# Patient Record
Sex: Male | Born: 1965 | Race: White | Hispanic: No | Marital: Single | Smoking: Former smoker
Health system: Southern US, Community
[De-identification: ages and names within clinical notes are randomized; demographics above are authoritative.]

## PROBLEM LIST (undated history)

## (undated) ENCOUNTER — Emergency Department (HOSPITAL_COMMUNITY): Admission: EM | Payer: Medicare Other

## (undated) ENCOUNTER — Emergency Department (HOSPITAL_COMMUNITY): Admission: EM | Payer: Medicare Other | Source: Home / Self Care

## (undated) DIAGNOSIS — N289 Disorder of kidney and ureter, unspecified: Secondary | ICD-10-CM

## (undated) DIAGNOSIS — Z95810 Presence of automatic (implantable) cardiac defibrillator: Secondary | ICD-10-CM

## (undated) DIAGNOSIS — Z720 Tobacco use: Secondary | ICD-10-CM

## (undated) DIAGNOSIS — I10 Essential (primary) hypertension: Secondary | ICD-10-CM

## (undated) DIAGNOSIS — F1910 Other psychoactive substance abuse, uncomplicated: Secondary | ICD-10-CM

## (undated) DIAGNOSIS — I472 Ventricular tachycardia: Secondary | ICD-10-CM

## (undated) DIAGNOSIS — I480 Paroxysmal atrial fibrillation: Secondary | ICD-10-CM

## (undated) HISTORY — DX: Ventricular tachycardia: I47.2

## (undated) HISTORY — PX: CARDIAC DEFIBRILLATOR PLACEMENT: SHX171

## (undated) HISTORY — DX: Paroxysmal atrial fibrillation: I48.0

## (undated) HISTORY — DX: Disorder of kidney and ureter, unspecified: N28.9

## (undated) HISTORY — PX: HERNIA REPAIR: SHX51

## (undated) HISTORY — PX: CERVICAL SPINE SURGERY: SHX589

## (undated) HISTORY — DX: Essential (primary) hypertension: I10

## (undated) HISTORY — DX: Other psychoactive substance abuse, uncomplicated: F19.10

## (undated) HISTORY — DX: Tobacco use: Z72.0

## (undated) HISTORY — PX: LUMBAR SPINE SURGERY: SHX701

## (undated) HISTORY — PX: CORONARY ANGIOPLASTY: SHX604

## (undated) NOTE — *Deleted (*Deleted)
***In Progress*** PCP: Betti Cruz, PA-C EP: Dr. [name] Cardiology: Dr. Shirlee Latch  HPI:  89 y.o. with history of nonischemic dilated cardiomyopathy and paroxysmal atrial fibrillation presents for followup of CHF.  Patient lives in [place], [place].  He gets sporadic cardiology care as it is hard for him to get a ride to [place] and he does not want to see any of the cardiologists in [place]. He is followed by Dr. [name] for his [facility] ICD.  I saw him initially in 3/19, when he was referred by Dr. [name] for left/right heart cath.  This showed no coronary disease and low but not markedly low cardiac output (CI 2.22).  Echo in 3/19 showed a severely dilated LV with EF 20%.    Echo in 6/21 showed EF <20%, severe LV dilation, normal RV size with mildly decreased systolic function, IVC normal.  RHC was done in 6/21, showing low filling pressures and CI 2.38 by Fick/2.68 by Thermo.  CPX in 6/21 showed both severe deconditioning and severe HF limitation.  In 7/21, he had barostimulation activator therapy placed.   Recently returned for CHF followup on 09/11/20. He walked with a cane chronically due to arthritis (neck and back pain, has had back surgery) and poor balance with prior CVA.  He also quit smoking. Since barostimulator was placed, he reported feeling better. Weight was stable. He was riding his stationary bike for exercise and walking outside with his walker. He reported not getting short of breath walking with his walker but getting short of breath making up his bed and doing other forms of moderate exertion. No chest pain, orthopnea, or PND.    Today he returns to HF clinic for pharmacist medication titration. At last visit with MD losartan was increased to 25 mg QHS and amiodarone was decreased to 100 mg daily.   Need BMET   Overall feeling ***. Dizziness, lightheadedness, fatigue:  Chest pain or palpitations:  How is your breathing?: *** SOB: Able to complete all ADLs. Activity  level ***  Weight at home pounds.*** Takes furosemide/torsemide/bumex *** mg *** daily.  LEE *** PND/Orthopnea ***  Appetite *** Low-salt diet:   Physical Exam Cost/affordability of meds   . Shortness of breath/dyspnea on exertion? {YES [id number]  . Orthopnea/PND? {YES [id number] . Edema? {YES [id number] . Lightheadedness/dizziness? {YES [id number] . Daily weights at home? {YES [id number] . Blood pressure/heart rate monitoring at home? {YES [id number] . Following low-sodium/fluid-restricted diet? {YES NO:22349}  HF Medications: Losartan 25 mg QHS Spironolactone 25 mg daily Farxiga (dapagliflozin) 10 mg daily Digoxin 0.125 mg daily Furosemide 40 mg daily PRN  Has the patient been experiencing any side effects to the medications prescribed?  {YES NO:22349}  Does the patient have any problems obtaining medications due to transportation or finances?   {YES NO:22349}  Understanding of regimen: {excellent/good/fair/poor:19665} Understanding of indications: {excellent/good/fair/poor:19665} Potential of compliance: {excellent/good/fair/poor:19665} Patient understands to avoid NSAIDs. Patient understands to avoid decongestants.    Pertinent Lab Values: . Serum creatinine ***, BUN ***, Potassium ***, Sodium ***, BNP ***, Magnesium ***, Digoxin ***   Vital Signs: . Weight: *** (last clinic weight: ***) . Blood pressure: ***  . Heart rate: ***   Assessment: 1. Chronic systolic CHF: Echo 6/21 with EF <20%, severe LV dilation, normal RV size with mildly decreased systolic function, IVC normal.  LHC/RHC in 3/20 with no coronary disease, relatively preserved cardiac output.  Nonischemic cardiomyopathy with [employer] ICD.  Probably not CRT candidate, has IVCD but not classic LBBB.  RHC in 6/21 showed low filling pressures and preserved cardiac output, but CPX (submaximal) in 6/21 showed severe limitation due to HF as well as severe limitation due to deconditioning. - NYHA class III symptoms,  better since barostimulator implanted. Euvolemic on exam - Continue furosemide 40 mg daily PRN - Continue*** losartan ***25 mg QHS. Check BMET today. - Continue spironolactone 25 mg daily - Continue Farxiga (dapagliflozin) 10 mg daily - Continue digoxin 0.125 daily - Markedly deconditioned and cachectic, needs to be in better physical shape prior to any consideration of advanced therapies.  Luckily, he is feeling better with barostimulation and cardiac output was not low on recent RHC.  He has been drinking Ensure. Previously encouraged to join cardiac rehab but he does not have transportation.   2. Smoking/?COPD: Patient has quit smoking.  Restrictive PFTs with 6/21 CPX.   3. H/o VT:  - Continue amiodarone 100 mg daily.  - Previously instructed to get a regular eye exam.   4. Atrial fibrillation: Paroxysmal. NSR today.  - Continue warfarin.   Plan: 1) Medication changes: Based on clinical presentation, vital signs and recent labs will *** 2) Labs: *** 3) Follow-up: 8 week appointment with Dr. Shirlee Latch on 12/05/20   Karle Plumber, PharmD, BCPS, BCCP, CPP Heart Failure Clinic Pharmacist [phone]

---

## 1999-05-08 DIAGNOSIS — I509 Heart failure, unspecified: Secondary | ICD-10-CM

## 1999-05-08 HISTORY — DX: Heart failure, unspecified: I50.9

## 2008-08-07 HISTORY — PX: THORACOTOMY: SUR1349

## 2009-09-22 ENCOUNTER — Ambulatory Visit: Payer: Self-pay | Admitting: Internal Medicine

## 2009-09-22 ENCOUNTER — Inpatient Hospital Stay (HOSPITAL_COMMUNITY): Admission: EM | Admit: 2009-09-22 | Discharge: 2009-10-02 | Payer: Self-pay | Admitting: Cardiology

## 2009-09-22 ENCOUNTER — Encounter: Payer: Self-pay | Admitting: Cardiology

## 2009-09-22 ENCOUNTER — Ambulatory Visit: Payer: Self-pay | Admitting: Cardiology

## 2009-09-23 ENCOUNTER — Encounter: Payer: Self-pay | Admitting: Cardiology

## 2009-09-24 ENCOUNTER — Encounter: Payer: Self-pay | Admitting: Cardiology

## 2009-09-24 ENCOUNTER — Encounter: Payer: Self-pay | Admitting: Internal Medicine

## 2009-09-25 ENCOUNTER — Encounter: Payer: Self-pay | Admitting: Cardiology

## 2009-09-26 ENCOUNTER — Encounter: Payer: Self-pay | Admitting: Cardiology

## 2009-10-02 ENCOUNTER — Encounter: Payer: Self-pay | Admitting: Cardiology

## 2009-10-08 ENCOUNTER — Ambulatory Visit: Payer: Self-pay | Admitting: Cardiology

## 2009-10-08 DIAGNOSIS — I10 Essential (primary) hypertension: Secondary | ICD-10-CM | POA: Insufficient documentation

## 2009-10-08 DIAGNOSIS — F1910 Other psychoactive substance abuse, uncomplicated: Secondary | ICD-10-CM | POA: Insufficient documentation

## 2009-10-08 DIAGNOSIS — I5022 Chronic systolic (congestive) heart failure: Secondary | ICD-10-CM | POA: Insufficient documentation

## 2009-10-08 DIAGNOSIS — F17200 Nicotine dependence, unspecified, uncomplicated: Secondary | ICD-10-CM | POA: Insufficient documentation

## 2009-10-22 ENCOUNTER — Ambulatory Visit: Payer: Self-pay | Admitting: Cardiology

## 2009-10-22 DIAGNOSIS — G4700 Insomnia, unspecified: Secondary | ICD-10-CM | POA: Insufficient documentation

## 2009-10-22 DIAGNOSIS — F411 Generalized anxiety disorder: Secondary | ICD-10-CM | POA: Insufficient documentation

## 2009-10-22 DIAGNOSIS — I428 Other cardiomyopathies: Secondary | ICD-10-CM | POA: Insufficient documentation

## 2009-10-24 ENCOUNTER — Encounter: Payer: Self-pay | Admitting: Cardiology

## 2009-11-05 ENCOUNTER — Encounter: Payer: Self-pay | Admitting: Cardiology

## 2009-11-06 ENCOUNTER — Encounter: Payer: Self-pay | Admitting: Cardiology

## 2009-12-11 ENCOUNTER — Encounter: Payer: Self-pay | Admitting: Internal Medicine

## 2009-12-24 ENCOUNTER — Ambulatory Visit: Payer: Self-pay | Admitting: Cardiology

## 2010-05-09 ENCOUNTER — Encounter: Payer: Self-pay | Admitting: Cardiology

## 2010-05-14 ENCOUNTER — Ambulatory Visit: Payer: Self-pay | Admitting: Cardiology

## 2010-05-14 ENCOUNTER — Encounter: Payer: Self-pay | Admitting: Cardiology

## 2010-06-27 ENCOUNTER — Ambulatory Visit: Payer: Self-pay | Admitting: Internal Medicine

## 2010-07-08 ENCOUNTER — Encounter: Payer: Self-pay | Admitting: Internal Medicine

## 2010-07-28 ENCOUNTER — Telehealth (INDEPENDENT_AMBULATORY_CARE_PROVIDER_SITE_OTHER): Payer: Self-pay | Admitting: *Deleted

## 2010-07-29 ENCOUNTER — Encounter: Payer: Self-pay | Admitting: Internal Medicine

## 2010-07-29 ENCOUNTER — Telehealth: Payer: Self-pay | Admitting: Internal Medicine

## 2010-08-08 ENCOUNTER — Encounter: Payer: Self-pay | Admitting: Internal Medicine

## 2010-08-15 ENCOUNTER — Observation Stay (HOSPITAL_COMMUNITY): Admission: RE | Admit: 2010-08-15 | Discharge: 2010-08-16 | Payer: Self-pay | Admitting: Internal Medicine

## 2010-08-15 ENCOUNTER — Ambulatory Visit: Payer: Self-pay | Admitting: Internal Medicine

## 2010-08-21 ENCOUNTER — Encounter: Payer: Self-pay | Admitting: Internal Medicine

## 2010-08-28 ENCOUNTER — Ambulatory Visit: Payer: Self-pay

## 2010-08-28 ENCOUNTER — Encounter: Payer: Self-pay | Admitting: Internal Medicine

## 2010-11-26 ENCOUNTER — Ambulatory Visit: Payer: Self-pay | Admitting: Internal Medicine

## 2011-01-08 NOTE — Letter (Signed)
Summary: Notice of Privacy Practices   Notice of Privacy Practices   Imported By: Roderic Ovens 06/30/2010 10:07:56  _____________________________________________________________________  External Attachment:    Type:   Image     Comment:   External Document

## 2011-01-08 NOTE — Letter (Signed)
Summary: Appointment -missed  [facility] at [facility] [address]   [place], [place] [address]   Phone: [phone]  Fax: [phone]     December 11, 2009 MRN: [id number]      [facility] [address] [place], [place]  [address]      Dear Mr. [facility],  Our records indicate you missed your appointment on December 11, 2009                        with Dr. [name].   It is very important that we reach you to reschedule this appointment. We look forward to participating in your health care needs.   Please contact us at the number listed above at your earliest convenience to reschedule this appointment.   Sincerely,    Glass blower/designer

## 2011-01-08 NOTE — Assessment & Plan Note (Signed)
Summary: PMH   Past Cardiac History:  [facility] Hospitalizations: 1. Right heart cardiac catheterization. 2. A 2-D echocardiogram.   PRIMARY FINAL DISCHARGE DIAGNOSIS:  Acute systolic congestive heart failure secondary to nonischemic cardiomyopathy.   SECONDARY DIAGNOSES: 1. History of polysubstance abuse. 2. History of left thoracotomy in September 2009 for rib fracture and     associated pneumothorax. 3. Hypertension. 4. Allergy or intolerance to PENICILLIN. 5. History of tobacco use. 6. Family history of congestive heart failure in his brother. 7. Gout. 09/25/2009 - 11/02/2009:  CHF (CHF)     Cardiac Cath  Procedure date:  09/25/2009  Findings:      FINDINGS:  Mean right atrial pressure 10 mmHg, RV 49/8, PA 45/29, mean pulmonary capillary wedge pressure 25 mmHg.  Thermodilution cardiac output 2.21 L/minute, cardiac index 1.23.  Fick cardiac output 2.77 L/minute, cardiac index 1.55.  Mixed venous O2 saturation was 54%.   IMPRESSION:  Severe nonischemic cardiomyopathy with significantly decreased cardiac index and mild-to-moderate filling pressure elevation. We will plan on transferring the patient to the step-down unit.  We will place a PICC line and start him on milrinone 0.25 mcg/kg per minute. With the check, oximetry after milrinone was begun.  I will also start him on Lasix 40 mg IV q.8 h and continue his other medications.    Echocardiogram  Procedure date:  09/26/2009  Findings:        1. Left ventricle: The cavity size was moderately dilated. Wall      thickness was normal. The estimated ejection fraction was in the      range of 15% to 20%. Diffuse hypokinesis.   2. Mitral valve: Moderate regurgitation.   3. Right ventricle: The cavity size was moderately dilated. Systolic      function was moderately to severely reduced.   4. Right atrium: The atrium was mildly dilated.   Transthoracic echocardiography. M-mode, complete 2D, spectral   Doppler, and color  Doppler. Patient status: Inpatient. Location:   ICU/CCU   VQ Scan  Procedure date:  09/25/2009  Findings:        VENTILATION PERFUSION SCINTIGRAPHY    Findings: Ventilation imaging with 10. Xe-133 inhaled shows   homogeneous distribution throughout both lungs with mild retention   in both lung bases on the washout images. Perfusion imaging with   6.55mCi Tc33m MAA IV shows a somewhat heterogeneous distribution   throughout the lungs with no discrete segmental or subsegmental   perfusion defects.    IMPRESSION:   1.  Low likelihood ratio for pulmonary embolism.

## 2011-01-08 NOTE — Miscellaneous (Signed)
Summary: Home Care Report/ [facility]   Home Care Report/ [facility]   Imported By: Dorise Hiss 11/25/2009 16:44:47  _____________________________________________________________________  External Attachment:    Type:   Image     Comment:   External Document

## 2011-01-08 NOTE — Assessment & Plan Note (Signed)
Summary: 2 MO F/U PER 10/22/09 OV-JM   Visit Type:  Follow-up Primary Provider:  Vickey Sages, MD  CC:  follow-up visit.  History of Present Illness: the patient is a 45 year old male with a history of severe nonischemic cardiomyopathy with an ejection fraction of 15-20%. The patient has biventricular failure with severe RV dysfunction. He previously required an admission for intravenous milrinone. Initially it was an NYHA class III but on her current medical therapy has improved on NYHA class II. The patient has been compliant with his medical regimen. He actually denies any chest pain or shortness of breath on exertion. He denies any orthopnea or PND. He reports a significant improvement in his symptomatology. He denies any further drug use. Unfortunate patient had a fall when he slipped on the ice. He denies however any loss of consciousness.  Clinical Review Panels:  Echocardiogram Echocardiogram   1. Left ventricle: The cavity size was moderately dilated. Wall      thickness was normal. The estimated ejection fraction was in the      range of 15% to 20%. Diffuse hypokinesis.   2. Mitral valve: Moderate regurgitation.   3. Right ventricle: The cavity size was moderately dilated. Systolic      function was moderately to severely reduced.   4. Right atrium: The atrium was mildly dilated.   Transthoracic echocardiography. M-mode, complete 2D, spectral   Doppler, and color Doppler. Patient status: Inpatient. Location:   ICU/CCU (09/26/2009)  Cardiac Imaging Cardiac Cath Findings FINDINGS:  Mean right atrial pressure 10 mmHg, RV 49/8, PA 45/29, mean pulmonary capillary wedge pressure 25 mmHg.  Thermodilution cardiac output 2.21 L/minute, cardiac index 1.23.  Fick cardiac output 2.77 L/minute, cardiac index 1.55.  Mixed venous O2 saturation was 54%.   IMPRESSION:  Severe nonischemic cardiomyopathy with significantly decreased cardiac index and mild-to-moderate filling pressure  elevation. We will plan on transferring the patient to the step-down unit.  We will place a PICC line and start him on milrinone 0.25 mcg/kg per minute. With the check, oximetry after milrinone was begun.  I will also start him on Lasix 40 mg IV q.8 h and continue his other medications.   (09/25/2009)    Preventive Screening-Counseling & Management  Alcohol-Tobacco     Smoking Status: quit     Year Quit: 09/2009  Current Medications (verified): 1)  Coreg 6.25 Mg Tabs (Carvedilol) .... Take 1 Tablet By Mouth Two Times A Day 2)  Furosemide 40 Mg Tabs (Furosemide) .... Take 1 Tablet By Mouth Once A Day 3)  Lisinopril 10 Mg Tabs (Lisinopril) .... Take 1 Tablet By Mouth Two Times A Day 4)  Spironolactone 25 Mg Tabs (Spironolactone) .... Take 1 Tablet By Mouth Once A Day 5)  Aspirin 81 Mg Tbec (Aspirin) .... Take 1 Tablet By Mouth Once A Day 6)  Digoxin 0.25 Mg Tabs (Digoxin) .... Take 1 Tablet By Mouth Once A Day 7)  Multivitamins  Tabs (Multiple Vitamin) .... Take 1 Tablet By Mouth Once A Day 8)  Lorazepam 0.5 Mg Tabs (Lorazepam) .... Take 1 Tablet By Mouth Once A Day As Needed Anxiety 9)  Citalopram Hydrobromide 20 Mg Tabs (Citalopram Hydrobromide) .... Take 1 Tablet By Mouth Once A Day  Allergies (verified): 1)  Pcn  Comments:  Nurse/Medical Assistant: The patient's medications and allergies were reviewed with the patient and were updated in the Medication and Allergy Lists. List reviewed.  Past History:  Past Medical History: Last updated: 10/08/2009 Hypertension Tobacco use Gout Systolic  heart failure secondary to nonischemic cardiomyopathy. Polysubstance abuse Renal insufficiency  Past Surgical History: Last updated: 10/08/2009  History of left thoracotomy in September 2009 for rib fracture and     associated pneumothorax.  Family History: Last updated: 10/08/2009 Positive for dilated cardiomyopathy in brother  Social History: Last updated:  10/08/2009 Disability Drinks 6-8  beers on the weekend Uses marijuana occasionally  Risk Factors: Smoking Status: quit (12/24/2009)  Past Cardiac History:  [facility] Hospitalizations: 1. Right heart cardiac catheterization. 2. A 2-D echocardiogram.   PRIMARY FINAL DISCHARGE DIAGNOSIS:  Acute systolic congestive heart failure secondary to nonischemic cardiomyopathy.   SECONDARY DIAGNOSES: 1. History of polysubstance abuse. 2. History of left thoracotomy in September 2009 for rib fracture and     associated pneumothorax. 3. Hypertension. 4. Allergy or intolerance to PENICILLIN. 5. History of tobacco use. 6. Family history of congestive heart failure in his brother. 7. Gout. 09/25/2009 - 11/02/2009:  CHF (CHF)  Social History: Smoking Status:  quit  Review of Systems       The patient complains of anxiety.  The patient denies fatigue, malaise, fever, weight gain/loss, vision loss, decreased hearing, hoarseness, chest pain, palpitations, shortness of breath, prolonged cough, wheezing, sleep apnea, coughing up blood, abdominal pain, blood in stool, nausea, vomiting, diarrhea, heartburn, incontinence, blood in urine, muscle weakness, joint pain, leg swelling, rash, skin lesions, headache, fainting, dizziness, depression, enlarged lymph nodes, easy bruising or bleeding, and environmental allergies.    Vital Signs:  Patient profile:   45 year old male Height:      73 inches Weight:      135 pounds Pulse rate:   72 / minute BP sitting:   118 / 82  (left arm) Cuff size:   regular  Vitals Entered By: Carlye Grippe (December 24, 2009 11:15 AM) CC: follow-up visit   Physical Exam  Additional Exam:  General: Well-developed, well-nourished in no distress head: Normocephalic and atraumatic eyes PERRLA/EOMI intact, conjunctiva and lids normal nose: No deformity or lesions mouth normal dentition, normal posterior pharynx neck: Supple, no JVD.  No masses, thyromegaly or abnormal  cervical nodes lungs: Normal breath sounds bilaterally without wheezing.  Normal percussion heart: regular rate and rhythm with normal S1 and S2, no S3 or S4.  PMI is normal.  No pathological murmurs abdomen: Normal bowel sounds, abdomen is soft and nontender without masses, organomegaly or hernias noted.  No hepatosplenomegaly musculoskeletal: Back normal, normal gait muscle strength and tone normal pulsus: Pulse is normal in all 4 extremities Extremities: No peripheral pitting edema neurologic: Alert and oriented x 3 skin: Intact without lesions or rashes cervical nodes: No significant adenopathy psychologic: Normal affect     Impression & Recommendations:  Problem # 1:  CARDIOMYOPATHY, DILATED (ICD-425.4) the patient heart failure symptoms are well controlled. He appears euvolemic.he is toleratingg his current medical regimen. He is NYHA class II.we will follow up with an echocardiogram in 6 months. If the patient at that time his ejection fraction of greater than 35% we can defer ICD treatment. However his ejection fraction is less than 35% and the patient remains compliant with his medical regimen we will consider an EP referral His updated medication list for this problem includes:    Coreg 6.25 Mg Tabs (Carvedilol) .Marland Kitchen... Take 1 tablet by mouth two times a day    Furosemide 40 Mg Tabs (Furosemide) .Marland Kitchen... Take 1 tablet by mouth once a day    Lisinopril 10 Mg Tabs (Lisinopril) .Marland Kitchen... Take 1 tablet by mouth  two times a day    Spironolactone 25 Mg Tabs (Spironolactone) .Marland Kitchen... Take 1 tablet by mouth once a day    Aspirin 81 Mg Tbec (Aspirin) .Marland Kitchen... Take 1 tablet by mouth once a day    Digoxin 0.25 Mg Tabs (Digoxin) .Marland Kitchen... Take 1 tablet by mouth once a day  Problem # 2:  SUBSTANCE ABUSE, MULTIPLE (ICD-305.90) patient denies any further substance abuse  Problem # 3:  ANXIETY STATE, UNSPECIFIED (ICD-300.00) the patient will continue on citalopram and low dose lorazepam. He feels his anxiety  state has been well controlled.  Problem # 4:  TOBACCO ABUSE (ICD-305.1) Assessment: Comment Only  Patient Instructions: 1)  Lorazepam 0.5mg  daily as needed anxiety 2)  Citalopram 20mg  daily 3)  2 D Echo in 6 months just befor next visit 4)  Follow up in  6 months.   Prescriptions: CITALOPRAM HYDROBROMIDE 20 MG TABS (CITALOPRAM HYDROBROMIDE) Take 1 tablet by mouth once a day  #60 x 3   Entered by:   Hoover Brunette, LPN   Authorized by:   Lewayne Bunting, MD, [facility]   Signed by:   Hoover Brunette, LPN on 16/09/9603   Method used:   Electronically to        [facility] [phone]* (retail)       [address]       [place], [place]  [address]       Ph: [phone]       Fax: [phone]   RxID:   [id number] LORAZEPAM 0.5 MG TABS (LORAZEPAM) Take 1 tablet by mouth once a day as needed anxiety  #30 x 1   Entered by:   Hoover Brunette, LPN   Authorized by:   Lewayne Bunting, MD, [facility]   Signed by:   Hoover Brunette, LPN on 53/66/4403   Method used:   Handwritten   RxID:   [id number]

## 2011-01-08 NOTE — Assessment & Plan Note (Signed)
Summary: PER DR. HOCHREIN @ CHECK OUT   Visit Type:  Follow-up Primary Provider:  Vickey Sages, MD   History of Present Illness: the patient has a nonischemic cardiomyopathy and severe LV dysfunction with an ejection fraction of 15 to 20%.  Also severe RV dysfunction.  He was admitted to [facility] for biventricular failure requiring intravenous milrinone.  Right heart catheterization was performed which demonstrated low cardiac output state.has been compliant with his medications.  He is currently in NYHA class IIb/3.  He denies currently orthopnea PND.  He does have difficulty with sleeping.  Is requesting a prescription of lorazepam and Ambien.  The patient is very anxious in the office and states he has ruminating thoughts.  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  He denies any chest pain, palpitations presyncope or syncope.  Clinical Review Panels:  CXR CXR results  Clinical Data: PICC placement.  Chest pain.    PORTABLE CHEST - 1 VIEW    Comparison: 09/23/2009    Findings: Trachea is midline.  Heart is enlarged.  Right PICC tip   projects over the SVC.  Small pleural effusion or pleural   parenchymal scarring at the left costophrenic angle.  Lungs are   otherwise clear.  No pleural fluid.    IMPRESSION:   Small left pleural effusion or pleural parenchymal scarring at the   left costophrenic angle. (09/25/2009)  Echocardiogram Echocardiogram   1. Left ventricle: The cavity size was moderately dilated. Wall      thickness was normal. The estimated ejection fraction was in the      range of 15% to 20%. Diffuse hypokinesis.   2. Mitral valve: Moderate regurgitation.   3. Right ventricle: The cavity size was moderately dilated. Systolic      function was moderately to severely reduced.   4. Right atrium: The atrium was mildly dilated.   Transthoracic echocardiography. M-mode, complete 2D, spectral   Doppler, and color Doppler. Patient status: Inpatient.  Location:   ICU/CCU (09/26/2009)  Cardiac Imaging Cardiac Cath Findings FINDINGS:  Mean right atrial pressure 10 mmHg, RV 49/8, PA 45/29, mean pulmonary capillary wedge pressure 25 mmHg.  Thermodilution cardiac output 2.21 L/minute, cardiac index 1.23.  Fick cardiac output 2.77 L/minute, cardiac index 1.55.  Mixed venous O2 saturation was 54%.   IMPRESSION:  Severe nonischemic cardiomyopathy with significantly decreased cardiac index and mild-to-moderate filling pressure elevation. We will plan on transferring the patient to the step-down unit.  We will place a PICC line and start him on milrinone 0.25 mcg/kg per minute. With the check, oximetry after milrinone was begun.  I will also start him on Lasix 40 mg IV q.8 h and continue his other medications.   (09/25/2009)    Current Medications (verified): 1)  Coreg 6.25 Mg Tabs (Carvedilol) .... Take 1 Tablet By Mouth Two Times A Day 2)  Furosemide 40 Mg Tabs (Furosemide) .... Take 1 Tablet By Mouth Once A Day 3)  Lisinopril 10 Mg Tabs (Lisinopril) .... Take 1 Tablet By Mouth Two Times A Day 4)  Spironolactone 25 Mg Tabs (Spironolactone) .... Take 1 Tablet By Mouth Once A Day 5)  Aspirin 81 Mg Tbec (Aspirin) .... Take 1 Tablet By Mouth Once A Day 6)  Digoxin 0.25 Mg Tabs (Digoxin) .... Take 1 Tablet By Mouth Once A Day 7)  Multivitamins  Tabs (Multiple Vitamin) .... Take 1 Tablet By Mouth Once A Day 8)  Ambien 5 Mg Tabs (Zolpidem Tartrate) .... Take 1 Tablet By  Mouth Once A Day At Bedtime As Needed Sleep 9)  Lorazepam 0.5 Mg Tabs (Lorazepam) .... Take 1 Tablet By Mouth Once A Day 10)  Citalopram Hydrobromide 20 Mg Tabs (Citalopram Hydrobromide) .... Take 1/2 Tablet (10mg ) Daily X 7 Days, Then 1 Tablet (20mg ) Daily  Allergies (verified): 1)  Pcn  Past History:  Past Medical History: Last updated: 10/08/2009 Hypertension Tobacco use Gout Systolic heart failure secondary to nonischemic cardiomyopathy. Polysubstance abuse Renal  insufficiency  Past Surgical History: Last updated: 10/08/2009  History of left thoracotomy in September 2009 for rib fracture and     associated pneumothorax.  Family History: Last updated: 10/08/2009 Positive for dilated cardiomyopathy in brother  Social History: Last updated: 10/08/2009 Disability Drinks 6-8  beers on the weekend Uses marijuana occasionally  Risk Factors: Smoking Status: quit < 6 months (10/08/2009)  Past Cardiac History:  [facility] Hospitalizations: 1. Right heart cardiac catheterization. 2. A 2-D echocardiogram.   PRIMARY FINAL DISCHARGE DIAGNOSIS:  Acute systolic congestive heart failure secondary to nonischemic cardiomyopathy.   SECONDARY DIAGNOSES: 1. History of polysubstance abuse. 2. History of left thoracotomy in September 2009 for rib fracture and     associated pneumothorax. 3. Hypertension. 4. Allergy or intolerance to PENICILLIN. 5. History of tobacco use. 6. Family history of congestive heart failure in his brother. 7. Gout. 09/25/2009 - 11/02/2009:  CHF (CHF)  Review of Systems       The patient complains of fatigue, weight gain/loss, shortness of breath, and anxiety.  The patient denies malaise, fever, vision loss, decreased hearing, hoarseness, chest pain, palpitations, prolonged cough, wheezing, sleep apnea, coughing up blood, abdominal pain, blood in stool, nausea, vomiting, diarrhea, heartburn, incontinence, blood in urine, muscle weakness, joint pain, leg swelling, rash, skin lesions, headache, fainting, dizziness, depression, enlarged lymph nodes, easy bruising or bleeding, and environmental allergies.    Vital Signs:  Patient profile:   45 year old male Height:      73 inches Weight:      136 pounds  Vitals Entered By: Carlye Grippe (October 22, 2009 3:20 PM)  Physical Exam  General:  Well developed, well nourished, in no acute distress. Head:  normocephalic and atraumatic Eyes:  PERRLA/EOM intact; conjunctiva and lids  normal. Nose:  no deformity, discharge, inflammation, or lesions Mouth:  Teeth, gums and palate normal. Oral mucosa normal. Neck:  Neck supple, no JVD. No masses, thyromegaly or abnormal cervical nodes. Lungs:  Clear bilaterally to auscultation and percussion. Heart:  regular rate and rhythm with normal S1-S2 there is an S3 present.  There is also palpable S3.  Left ventricular heave is present.  2/6 holosystolic murmur at the apex.  No pericardial friction rub Abdomen:  Bowel sounds positive; abdomen soft and non-tender without masses, organomegaly, or hernias noted. No hepatosplenomegaly. Msk:  Back normal, normal gait. Muscle strength and tone normal. Pulses:  pulses normal in all 4 extremities Extremities:  No clubbing or cyanosis. Neurologic:  Alert and oriented x 3. Skin:  Intact without lesions or rashes. Cervical Nodes:  no significant adenopathy Psych:  Normal affect.   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  Is currently well compensated on his medications but does tend to run a low blood pressure.  However he has no associated dizziness.  We will however not up titrate his medications. His updated medication list for this problem includes:    Coreg 6.25 Mg Tabs (Carvedilol) .Marland Kitchen... Take  1 tablet by mouth two times a day    Furosemide 40 Mg Tabs (Furosemide) .Marland Kitchen... Take 1 tablet by mouth once a day    Lisinopril 10 Mg Tabs (Lisinopril) .Marland Kitchen... Take 1 tablet by mouth two times a day    Spironolactone 25 Mg Tabs (Spironolactone) .Marland Kitchen... Take 1 tablet by mouth once a day    Aspirin 81 Mg Tbec (Aspirin) .Marland Kitchen... Take 1 tablet by mouth once a day    Digoxin 0.25 Mg Tabs (Digoxin) .Marland Kitchen... Take 1 tablet by mouth once a day  Problem # 2:  INSOMNIA (ICD-780.52) patient has significant insomnia.  Will give him a refill on his Ambien.  Problem # 3:  ANXIETY STATE, UNSPECIFIED (ICD-300.00) the  patient has anxiety with features of OCD.  Given a prescription of citalopram to start 10 mg p.o. daily then to increase to 20 mg p.o. daily as well as a short-term prescription for lorazepam to be discontinued after 4 to 6 weeks on citalopram.  We will provide no refills for his lorazepam.  Problem # 4:  SUBSTANCE ABUSE, MULTIPLE (ICD-305.90) patient currently denies any polysubstance abuse currently  Patient Instructions: 1)  lorazepam 0.5 mg daily #45 no refills 2)  Ambien 5 mg at bedtime #60 no refills 3)  citalopram 10 mg daily, increased to 20 mg daily after 7 days. Followup in 2 months. Prescriptions: CITALOPRAM HYDROBROMIDE 20 MG TABS (CITALOPRAM HYDROBROMIDE) take 1/2 tablet (10mg ) daily x 7 days, then 1 tablet (20mg ) daily  #30 x 1   Entered by:   Hoover Brunette, LPN   Authorized by:   Lewayne Bunting, MD, [facility]   Signed by:   Hoover Brunette, LPN on 16/09/9603   Method used:   Print then Give to Patient   RxID:   [id number] AMBIEN 5 MG TABS (ZOLPIDEM TARTRATE) Take 1 tablet by mouth once a day at bedtime as needed sleep  #60 x 0   Entered by:   Hoover Brunette, LPN   Authorized by:   Lewayne Bunting, MD, [facility]   Signed by:   Hoover Brunette, LPN on 21/30/8657   Method used:   Print then Give to Patient   RxID:   [id number] LORAZEPAM 0.5 MG TABS (LORAZEPAM) Take 1 tablet by mouth once a day  #45 x 0   Entered by:   Hoover Brunette, LPN   Authorized by:   Lewayne Bunting, MD, [facility]   Signed by:   Hoover Brunette, LPN on 12/09/7251   Method used:   Print then Give to Patient   RxID:   [id number]

## 2011-01-08 NOTE — Assessment & Plan Note (Signed)
Summary: nep/eval for defib   Visit Type:  Follow-up Referring Provider:  Dr Andee Lineman Primary Provider:  Vickey Sages, MD  CC:  nonischemic CM.  History of Present Illness: Chad Christensen is a pleasant 45 yo WM with a h/o nonischemic CM, NYHA Class II/III CHF who now presents today for EP consultation.  He reports initially being diagnosed with a nonischemic CM in the early 2000s.  He was felt to possibly have a cardiomyopathy due to polysubstance abuse.  He reports complete cessation of drugs and ETOH 2003 but continues to have a "weak heart".  He reports having a family history of CHF.  His brother had a dilated CM and died of CHF.  His father had CHF, but also had diabetes and other issues.   Presently, the patient reports doing reasonably well.  He has significant fatigue during the heat.  He reports dypsnea with moderate activity.   He denies orthopnea, edema or PND.  He denies CP, palpitations, presyncope, or syncope.   Current Medications (verified): 1)  Coreg 6.25 Mg Tabs (Carvedilol) .... Take 1 Tablet By Mouth Two Times A Day 2)  Furosemide 40 Mg Tabs (Furosemide) .... Take 1 Tablet By Mouth Once A Day 3)  Lisinopril 10 Mg Tabs (Lisinopril) .... Take 1 Tablet By Mouth Two Times A Day 4)  Spironolactone 25 Mg Tabs (Spironolactone) .... Take 1 Tablet By Mouth Once A Day 5)  Aspirin 81 Mg Tbec (Aspirin) .... Take 1 Tablet By Mouth Once A Day 6)  Digoxin 0.25 Mg Tabs (Digoxin) .... Take 1 Tablet By Mouth Once A Day 7)  Multivitamins  Tabs (Multiple Vitamin) .... Take 1 Tablet By Mouth Once A Day 8)  Citalopram Hydrobromide 20 Mg Tabs (Citalopram Hydrobromide) .... 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 Polysubstance abuse in remission since 2003 Renal insufficiency  Past Surgical History:  History of left thoracotomy in September 2009  for rib fracture and associated pneumothorax.  cervical and lumbar surgery  bilateral hernia repair  Past Cardiac History:  [facility] Hospitalizations: 1. Right heart cardiac catheterization. 2. A 2-D echocardiogram.   PRIMARY FINAL DISCHARGE DIAGNOSIS:  Acute systolic congestive heart failure secondary to nonischemic cardiomyopathy.   SECONDARY DIAGNOSES: 1. History of polysubstance abuse. 2. History of left thoracotomy in September 2009 for rib fracture and     associated pneumothorax. 3. Hypertension. 4. Allergy or intolerance to PENICILLIN. 5. History of tobacco use. 6. Family history of congestive heart failure in his brother. 7. Gout. 09/25/2009 - 11/02/2009:  CHF (CHF)  Family History: Reviewed history from 10/08/2009 and no changes required. Positive for dilated cardiomyopathy in brother  Social History: On disability.  Lives in [place].  He went to [employer] for 2.5 years.  Tob- quit x 1 year.  ETOH- 2-3 beers on the weekend. Uses marijuana occasionally  Review of Systems       All systems are reviewed and negative except as listed in the HPI.   Vital Signs:  Patient profile:   45 year old male Height:      73 inches Weight:      137 pounds BMI:     18.14 Pulse rate:   85 / minute BP sitting:   100 / 70  (left arm)  Vitals Entered By: Laurance Flatten CMA (June 27, 2010 2:39 PM)  Physical Exam  General:  Well developed, well nourished, in no acute distress. Head:  normocephalic and atraumatic Eyes:  PERRLA/EOM intact; conjunctiva and lids normal. Nose:  no deformity, discharge, inflammation, or lesions Mouth:  Teeth, gums and palate normal. Oral mucosa normal. Neck:  Neck supple, no JVD. No masses, thyromegaly or abnormal cervical nodes. Lungs:  Clear bilaterally to auscultation and percussion. Heart:  Non-displaced PMI, chest non-tender; regular rate and rhythm, S1, S2 without murmurs, rubs or gallops. Carotid upstroke normal, no bruit. Normal abdominal aortic  size, no bruits. Femorals normal pulses, no bruits. Pedals normal pulses. No edema, no varicosities. Abdomen:  Bowel sounds positive; abdomen soft and non-tender without masses, organomegaly, or hernias noted. No hepatosplenomegaly. Msk:  Back normal, normal gait. Muscle strength and tone normal. Pulses:  pulses normal in all 4 extremities Extremities:  No clubbing or cyanosis. Neurologic:  Alert and oriented x 3. Skin:  Intact without lesions or rashes. Cervical Nodes:  no significant adenopathy Psych:  Normal affect.    Echocardiogram  Procedure date:  08/27/2009  Findings:       Transthoracic Echocardiography  Study Conclusions    1. Left ventricle: The cavity size was moderately dilated. Wall      thickness was normal. The estimated ejection fraction was in the      range of 15% to 20%. Diffuse hypokinesis.   2. Mitral valve: Moderate regurgitation.   3. Right ventricle: The cavity size was moderately dilated. Systolic      function was moderately to severely reduced.   4. Right atrium: The atrium was mildly dilated.   Transthoracic echocardiography. M-mode, complete 2D, spectral   Doppler, and color Doppler. Patient status: Inpatient. Location:   ICU/CCU  Left ventricle: The cavity size was moderately dilated. Wall   thickness was normal. The estimated ejection fraction was in the   range of 15% to 20%. Diffuse hypokinesis.   Aortic valve: Structurally normal valve. Cusp separation was normal.   Doppler: Transvalvular velocity was within the normal range. There   was no stenosis. No regurgitation.   Mitral valve: The valve appears to be grossly normal. Doppler:   Moderate regurgitation.  Peak gradient: 4mm Hg (D).   Left atrium: The atrium was at the upper limits of normal in size.   Right ventricle: The cavity size was moderately dilated. Systolic   function was moderately to severely reduced.   Pulmonic valve: The valve appears to be grossly normal.   Tricuspid valve:  Structurally normal valve. Leaflet separation was   normal. Doppler: Transvalvular velocity was within the normal range.   No regurgitation.   Right atrium: The atrium was mildly dilated.   Pericardium: There was no pericardial effusion.       Cardiac Cath  Procedure date:  09/25/2009  Findings:       FINDINGS:  Mean right atrial pressure 10 mmHg, RV 49/8, PA 45/29, mean   pulmonary capillary wedge pressure 25 mmHg.  Thermodilution cardiac   output 2.21 L/minute, cardiac index 1.23.  Fick cardiac output 2.77   L/minute, cardiac index 1.55.  Mixed venous O2 saturation was 54%.      IMPRESSION:  Severe nonischemic cardiomyopathy with significantly   decreased cardiac index and mild-to-moderate filling pressure elevation.   We will plan on transferring the patient to the step-down unit.  We will   place a PICC line and start him on milrinone 0.25 mcg/kg per minute.   With the check, oximetry after milrinone was begun.  I will also start  him on Lasix 40 mg IV q.8 h and continue his other medications.        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  He meets  SCD-HeFT criteria for ICD implantation for primary prevention of sudden death.  Risks, benefits, alternatives to ICD implantation were discussed in detail with the patient today.  He understands that the risks include but are not limited to bleeding, infection, pneumothorax, perforation, tamponade, vascular damage, renal failure, MI, stroke, death, inappropriate shocks, and lead dislodgement.   We will schedule ICD implantation at the next available time.  His chronic systolic dysfunction appears stable today.  He is on an optimal medical regimen.  Problem # 2:  SUBSTANCE  ABUSE, MULTIPLE (ICD-305.90) Complete cessation of ETOH and all substances was discussed in detail today.  He assures me that he does not use drugs and that he drinks low amounts of ETOH.  I made it very clear that in our efforts to treat his cardiomyopathy that complete abstenance from ETOH was necessary.  Problem # 3:  HYPERTENSION (ICD-401.9) stable

## 2011-01-08 NOTE — Progress Notes (Signed)
Summary: re surgery 9-9  Phone Note Call from Patient   Caller: Patient Reason for Call: Talk to Nurse Summary of Call: pt has surgery 9-9 and hasn't rec packet from [place] yet-trying to arrange a ride-also rec msg yesterday but machine cut off and didn't hear most of the msg-pls call [phone] Initial call taken by: Glynda Jaeger,  July 29, 2010 10:36 AM  Follow-up for Phone Call        spoke with pt he is aware of time to be at hospital and to hold his Furosemide the morning of his procedure.  He will go to the [place] office for labs on 08/08/10.  Sent flag to [facility] He can get his instruction sheet at that time also  Pt aware Dennis Bast, RN, BSN  July 29, 2010 11:10 AM     Appended Document: Orders Update    Clinical Lists Changes  Orders: Added new Test order of T-Basic Metabolic Panel [phone]) - Signed Added new Test order of T-CBC No Diff ([id number]) - Signed Added new Test order of T-Protime, Auto ([id number]) - Signed Added new Test order of T-PTT ([id number]) - Signed

## 2011-01-08 NOTE — Miscellaneous (Signed)
Summary: Device preload  Clinical Lists Changes  Observations: Added new observation of ICD INDICATN: NICM (08/21/2010 12:36) Added new observation of ICDLEADSTAT1: active (08/21/2010 12:36) Added new observation of ICDLEADSER1: [id number] (08/21/2010 12:36) Added new observation of ICDLEADMOD1: 7122  (08/21/2010 12:36) Added new observation of ICDLEADLOC1: RV  (08/21/2010 12:36) Added new observation of ICD IMP MD: Hillis Range, MD  (08/21/2010 12:36) Added new observation of ICDLEADDOI1: 08/15/2010  (08/21/2010 12:36) Added new observation of ICD IMPL DTE: 08/15/2010  (08/21/2010 12:36) Added new observation of ICD SERL#: [id number]  (08/21/2010 12:36) Added new observation of ICD MODL#: [id number]  (08/21/2010 01:02) Added new observation of ICDMANUFACTR: [employer]  (08/21/2010 12:36) Added new observation of ICD MD: Hillis Range, MD  (08/21/2010 12:36)       ICD Specifications Following MD:  Hillis Range, MD     ICD Vendor:  [employer]     ICD Model Number:  [id number]     ICD Serial Number:  [id number] ICD DOI:  08/15/2010     ICD Implanting MD:  Hillis Range, MD  Lead 1:    Location: RV     DOI: 08/15/2010     Model #: [id number]     Serial #: [id number]     Status: active  Indications::  NICM

## 2011-01-08 NOTE — Progress Notes (Signed)
Summary: pending device insertion  Phone Note Call from Patient   Summary of Call: Patient called our office with questions about when and where he is to go for device.  Please call back at [phone]. Hoover Brunette, LPN  July 28, 2010 3:57 PM   Follow-up for Phone Call        called pt and left message to call me regarding setting up device.  Dennis Bast, RN, BSN  July 28, 2010 5:10 PM pt aware of time for procedure and labs Dennis Bast, RN, BSN  July 29, 2010 11:11 AM

## 2011-01-08 NOTE — Miscellaneous (Signed)
Summary: Home Care Report/ [facility]  Home Care Report/ [facility]   Imported By: Dorise Hiss 11/15/2009 12:35:01  _____________________________________________________________________  External Attachment:    Type:   Image     Comment:   External Document

## 2011-01-08 NOTE — Cardiovascular Report (Signed)
Summary: Pre Op Orders   Pre Op Orders   Imported By: Roderic Ovens 07/30/2010 11:52:43  _____________________________________________________________________  External Attachment:    Type:   Image     Comment:   External Document

## 2011-01-08 NOTE — Letter (Signed)
Summary: Implantable Device Instructions  [facility]  [address]   [place], [place] [address]   Phone: [phone]  Fax: [phone]      Implantable Device Instructions  You are scheduled for:  _____ Implantable Cardioverter Defibrillator   on 08/15/10 with Dr. Debby Bud:.  1.  Please arrive at the Short Stay Center at [facility] at 10:00am on the day of your procedure.  2.  Do not eat or drink the night before your procedure.  3.  Complete lab work on 08/08/10 in [place] office. You do not have to be fasting.  4.  Do NOT take these medications for the morning of your procedure:  Furosemide.  5.  Plan for an overnight stay.  Bring your insurance cards and a list of your medications.  6.  Wash your chest and neck with antibacterial soap (any brand) the evening before and the morning of your procedure.  Rinse well.  7.  Education material received:               ICD _____           *If you have ANY questions after you get home, please call the office [phone]. Anselm Pancoast  *Every attempt is made to prevent procedures from being rescheduled.  Due to the nauture of Electrophysiology, rescheduling can happen.  The physician is always aware and directs the staff when this occurs.

## 2011-01-08 NOTE — Cardiovascular Report (Signed)
Summary: Card Device Clinic/ [facility] SUMMARY  Card Device Clinic/ [facility] SUMMARY   Imported By: Dorise Hiss 12/02/2010 15:07:23  _____________________________________________________________________  External Attachment:    Type:   Image     Comment:   External Document

## 2011-01-08 NOTE — Assessment & Plan Note (Signed)
Summary: 3 mo fu   Visit Type:  Follow-up Referring Provider:  Dr Andee Lineman Primary Provider:  Vickey Sages, MD   History of Present Illness: The patient presents today for routine electrophysiology followup. He reports doing very well since having his ICD implanted. He denies procedure related complications.  The patient denies symptoms of palpitations, chest pain, shortness of breath, orthopnea, PND, lower extremity edema, dizziness, presyncope, syncope, or neurologic sequela. The patient is tolerating medications without difficulties and is otherwise without complaint today.   Preventive Screening-Counseling & Management  Alcohol-Tobacco     Smoking Status: quit < 6 months     Year Quit: 2011  Current Medications (verified): 1)  Coreg 6.25 Mg Tabs (Carvedilol) .... Take 1 Tablet By Mouth Two Times A Day 2)  Furosemide 40 Mg Tabs (Furosemide) .... Take 1 Tablet By Mouth Once A Day 3)  Spironolactone 25 Mg Tabs (Spironolactone) .... Take 1 Tablet By Mouth Once A Day 4)  Aspirin 81 Mg Tbec (Aspirin) .... Take 1 Tablet By Mouth Once A Day 5)  Digoxin 0.25 Mg Tabs (Digoxin) .... Take 1 Tablet By Mouth Once A Day 6)  Multivitamins  Tabs (Multiple Vitamin) .... Take 1 Tablet By Mouth Once A Day  Allergies (verified): 1)  Pcn  Comments:  Nurse/Medical Assistant: The patient's medications were reviewed with the patient's parent and were updated in the Medication and Allergy Lists. Med. bottles reviewed w/ patient.  Tammi Romine CMA (November 26, 2010 1:46 PM)  Past History:  Past Medical History: Reviewed history from 06/27/2010 and no changes required. Hypertension Tobacco use Gout Systolic heart failure secondary to nonischemic cardiomyopathy. Polysubstance abuse in remission since 2003 Renal insufficiency  Past Surgical History: Reviewed history from 06/27/2010 and no changes required.  History of left thoracotomy in September 2009 for rib fracture and associated  pneumothorax.  cervical and lumbar surgery  bilateral hernia repair  Past Cardiac History:  [facility] Hospitalizations: 1. Right heart cardiac catheterization. 2. A 2-D echocardiogram.   PRIMARY FINAL DISCHARGE DIAGNOSIS:  Acute systolic congestive heart failure secondary to nonischemic cardiomyopathy.   SECONDARY DIAGNOSES: 1. History of polysubstance abuse. 2. History of left thoracotomy in September 2009 for rib fracture and     associated pneumothorax. 3. Hypertension. 4. Allergy or intolerance to PENICILLIN. 5. History of tobacco use. 6. Family history of congestive heart failure in his brother. 7. Gout. 09/25/2009 - 11/02/2009:  CHF (CHF)  Social History: On disability.  Lives in [place].  He went to [place] [employer] for 2.5 years.  Tob- quit x 7 months.  ETOH- 2-3 beers on the weekend. Uses marijuana occasionally Smoking Status:  quit < 6 months  Review of Systems       All systems are reviewed and negative except as listed in the HPI.   Vital Signs:  Patient profile:   45 year old male Height:      73 inches Weight:      140 pounds BMI:     18.54 Pulse rate:   72 / minute BP sitting:   129 / 81  (right arm) Cuff size:   regular  Vitals Entered By: Fuller Plan CMA (November 26, 2010 1:33 PM)  Physical Exam  General:  Well developed, well nourished, in no acute distress. Head:  normocephalic and atraumatic Eyes:  PERRLA/EOM intact; conjunctiva and lids normal. Mouth:  Teeth, gums and palate normal. Oral mucosa normal. Neck:  Neck supple, no JVD. No masses, thyromegaly or abnormal cervical nodes. Chest Wall:  ICD pocket is well healed Lungs:  Clear bilaterally to auscultation and percussion. Heart:  Non-displaced PMI, chest non-tender; regular rate and rhythm, S1, S2 without murmurs, rubs or gallops. Carotid upstroke normal, no bruit. Normal abdominal aortic size, no bruits. Femorals normal pulses, no bruits. Pedals normal pulses. No edema, no varicosities. Abdomen:   Bowel sounds positive; abdomen soft and non-tender without masses, organomegaly, or hernias noted. No hepatosplenomegaly. Msk:  Back normal, normal gait. Muscle strength and tone normal. Extremities:  No clubbing or cyanosis. Neurologic:  Alert and oriented x 3.    ICD Specifications Following MD:  Hillis Range, MD     ICD Vendor:  St Jude     ICD Model Number:  [id number]     ICD Serial Number:  [id number] ICD DOI:  08/15/2010     ICD Implanting MD:  Hillis Range, MD  Lead 1:    Location: RV     DOI: 08/15/2010     Model #: [id number]     Serial #: [id number]     Status: active  Indications::  NICM   ICD Follow Up Battery Voltage:  94% V     Charge Time:  8.4 seconds     Battery Est. Longevity:  8.7-9.4 yrs Underlying rhythm:  SR @ 64   ICD Device Measurements Right Ventricle:  Amplitude: 11.8 mV, Impedance: 590 ohms, Threshold: 0.5 V at 0.5 msec Shock Impedance: 71 ohms   Episodes MS Episodes:  0     Shock:  0     ATP:  0     Nonsustained:  0     Atrial Therapies:  0 Ventricular Pacing:  <1%  Brady Parameters Mode VVI     Lower Rate Limit:  40      Tachy Zones VF:  222     VT:  176     Next Cardiology Appt Due:  02/05/2011 Tech Comments:  NORMAL DEVICE FUNCTION.  NO EPISODES SINCE LAST CHECK.  CHANGED RV OUTPUT FROM 3.5 TO 2.5 V. ROV IN 3 MTHS W/DEVICE CLINIC. 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 2)  Your physician recommends that you continue on your current medications as directed. Please refer to the Current Medication list given to you today. (Meds were refilled today). Prescriptions: DIGOXIN 0.25 MG TABS (DIGOXIN) Take 1  tablet by mouth once a day  #30 x 6   Entered by:   Cyril Loosen, RN, BSN   Authorized by:   Hillis Range, MD   Signed by:   Cyril Loosen, RN, BSN on 11/26/2010   Method used:   Electronically to        [facility] [address] (retail)       [address]       [place], [place]  [address]       Ph: [phone]       Fax: [phone]   RxID:   [id number] SPIRONOLACTONE 25 MG TABS (SPIRONOLACTONE) Take 1 tablet by mouth once a day  #30 x 6   Entered by:   Cyril Loosen, RN, BSN   Authorized by:   Hillis Range, MD   Signed by:   Victorino Dike  Christell Constant, RN, BSN on 11/26/2010   Method used:   Electronically to        [facility] [phone]* (retail)       [address]       [place], [place]  [address]       Ph: [phone]       Fax: [phone]   RxID:   [id number] FUROSEMIDE 40 MG TABS (FUROSEMIDE) Take 1 tablet by mouth once a day  #30 x 6   Entered by:   Cyril Loosen, RN, BSN   Authorized by:   Hillis Range, MD   Signed by:   Cyril Loosen, RN, BSN on 11/26/2010   Method used:   Electronically to        [facility] [phone]* (retail)       [address]       [place], [place]  [address]       Ph: [phone]       Fax: [phone]   RxID:   [id number] COREG 6.25 MG TABS (CARVEDILOL) Take 1 tablet by mouth two times a day  #60 x 6   Entered by:   Cyril Loosen, RN, BSN   Authorized by:   Hillis Range, MD   Signed by:   Cyril Loosen, RN, BSN on 11/26/2010   Method used:   Electronically to        [facility] [address] (retail)       [address]       [place], [place]  [address]       Ph: [phone]       Fax: [phone]   RxID:   [id number]

## 2011-01-08 NOTE — Cardiovascular Report (Signed)
Summary: Pre Op Orders   Pre Op Orders   Imported By: Roderic Ovens 08/04/2010 12:57:06  _____________________________________________________________________  External Attachment:    Type:   Image     Comment:   External Document

## 2011-01-08 NOTE — Procedures (Signed)
Summary: wound check   Current Medications (verified): 1)  Coreg 6.25 Mg Tabs (Carvedilol) .... Take 1 Tablet By Mouth Two Times A Day 2)  Furosemide 40 Mg Tabs (Furosemide) .... Take 1 Tablet By Mouth Once A Day 3)  Lisinopril 10 Mg Tabs (Lisinopril) .... Take 1 Tablet By Mouth Two Times A Day 4)  Spironolactone 25 Mg Tabs (Spironolactone) .... Take 1 Tablet By Mouth Once A Day 5)  Aspirin 81 Mg Tbec (Aspirin) .... Take 1 Tablet By Mouth Once A Day 6)  Digoxin 0.25 Mg Tabs (Digoxin) .... Take 1 Tablet By Mouth Once A Day 7)  Multivitamins  Tabs (Multiple Vitamin) .... Take 1 Tablet By Mouth Once A Day 8)  Citalopram Hydrobromide 20 Mg Tabs (Citalopram Hydrobromide) .... Take 1 Tablet By Mouth Once A Day  Allergies (verified): 1)  Pcn   ICD Specifications Following MD:  Hillis Range, MD     ICD Vendor:  St Jude     ICD Model Number:  [phone]     ICD Serial Number:  [id number] ICD DOI:  08/15/2010     ICD Implanting MD:  Hillis Range, MD  Lead 1:    Location: RV     DOI: 08/15/2010     Model #: 5427     Serial #: [id number]     Status: active  Indications::  NICM   ICD Follow Up Battery Voltage:  >95% V     Charge Time:  8.4 seconds     Battery Est. Longevity:  8.1-9.0 yrs Underlying rhythm:  SR   ICD Device Measurements Right Ventricle:  Amplitude: 11.8 mV, Impedance: 540 ohms, Threshold: 0.5 V at 0.5 msec Shock Impedance: 73 ohms   Episodes MS Episodes:  0     Shock:  0     ATP:  0     Nonsustained:  0     Atrial Therapies:  0 Ventricular Pacing:  <1%  Brady Parameters Mode VVI     Lower Rate Limit:  40      Tachy Zones VF:  222     VT:  176     Next Cardiology Appt Due:  11/26/2010 Tech Comments:  WOUND CHECK---STERI STRIPS REMOVED.  NO REDNESS OR SWELLING AT SITE.  NORMAL DEVICE FUNCTION.  NO EPISODES SINCE IMPLANT.  NO CHANGES MADE.  INSTRUCTIONS WERE GONE OVER FOR GETTING THERAPY.  PT VERBALIZES UNDERSTANDING.  PT HAS [employer] BUT AT THIS TIME DOES NOT HAVE HOME  PHONE.  PLANS TO GET HOME PHONE STRAIGHTENED OUT.  ROV 11-26-10 W/JA IN [place]. Vella Kohler  August 28, 2010 12:23 PM

## 2011-01-08 NOTE — Miscellaneous (Signed)
Summary: Orders Update  Clinical Lists Changes  Orders: Added new Referral order of 2-D Echocardiogram (2D Echo) - Signed 

## 2011-01-08 NOTE — Assessment & Plan Note (Signed)
Summary: POST [facility] D/C 10-27   Visit Type:  Follow-up Primary Provider:  Vickey Sages, MD  CC:  CHF.  History of Present Illness: The patient presents for hospital followup after a recent admission for management of a dilated cardiomyopathy. His course was complicated by renal insufficiency and hypotension. He required inotropic therapy. He did have a right heart catheterization. Slowly he was able to be weaned off inotropes and oral medications titrated. He was seen by Dr.Allred and will be considered for an ICD based on future symptoms and his ejection fraction after meds have been titrated.  He reports that he is breathing much better than he was. He still sleeps on 3 pillows. However, he is not describing any new PND. He is able to be a little more active and is trying to do some activities around his house and walking. He is not describing any chest pressure, neck or arm discomfort. He is not describing any palpitations, presyncope or syncope. He is not smoking cigarettes. He still does drink some alcohol on the weekends by his report.  Preventive Screening-Counseling & Management  Alcohol-Tobacco     Smoking Status: quit < 6 months  Comments: Started smoking at 45 yrs old. Stopped about 2-3 weeks ago.  Current Medications (verified): 1)  Coreg 6.25 Mg Tabs (Carvedilol) .... Take 1 Tablet By Mouth Two Times A Day 2)  Furosemide 40 Mg Tabs (Furosemide) .... Take 1 Tablet By Mouth Once A Day 3)  Lisinopril 2.5 Mg Tabs (Lisinopril) .... Take 3 Tablet By Mouth Two Times A Day 4)  Lorazepam 0.5 Mg Tabs (Lorazepam) .... Take 1 Tablet By Mouth Once A Day At Bedtime As Needed 5)  Lortab 5 5-500 Mg Tabs (Hydrocodone-Acetaminophen) .... Take 1 Tablet By Mouth Three Times A Day As Needed 6)  Spironolactone 25 Mg Tabs (Spironolactone) .... Take 1 Tablet By Mouth Once A Day 7)  Aspirin 81 Mg Tbec (Aspirin) .... Take 1 Tablet By Mouth Once A Day 8)  Digoxin 0.125 Mg Tabs (Digoxin) .... Take 1  Tablet By Mouth Once A Day 9)  Multivitamins  Tabs (Multiple Vitamin) .... Take 1 Tablet By Mouth Once A Day  Allergies (verified): 1)  Pcn  Past History:  Past Medical History: Hypertension Tobacco use Gout Systolic heart failure secondary to nonischemic cardiomyopathy. Polysubstance abuse Renal insufficiency  Past Surgical History:  History of left thoracotomy in September 2009 for rib fracture and     associated pneumothorax.  Social History: Smoking Status:  quit < 6 months  Review of Systems       As stated in the HPI and negative for all other systems.   Vital Signs:  Patient profile:   45 year old male Height:      73 inches Weight:      132.50 pounds BMI:     17.54 Pulse rate:   80 / minute BP sitting:   124 / 80  (left arm) Cuff size:   regular  Vitals Entered By: Cyril Loosen, RN, BSN (October 08, 2009 11:22 AM) CC: CHF Is Patient Diabetic? No Comments Follow up DC from [facility]. Pt given RX for BMET to be done before office visit today. He states he has not had this done yet because he did not have transportation.   Physical Exam  General:  Well developed, well nourished, in no acute distress. Head:  normocephalic and atraumatic Eyes:  PERRLA/EOM intact; conjunctiva and lids normal. Mouth:  Teeth, gums and palate normal. Oral  mucosa normal. Neck:  Neck supple, no JVD. No masses, thyromegaly or abnormal cervical nodes. Chest Wall:  no deformities or breast masses noted Lungs:  Clear bilaterally to auscultation and percussion. Abdomen:  Bowel sounds positive; abdomen soft and non-tender without masses, organomegaly, or hernias noted. No hepatosplenomegaly. Msk:  Back normal, normal gait. Muscle strength and tone normal. Extremities:  No clubbing or cyanosis. Neurologic:  Alert and oriented x 3. Skin:  Intact without lesions or rashes. Cervical Nodes:  no significant adenopathy Axillary Nodes:  no significant adenopathy Inguinal Nodes:  no  significant adenopathy Psych:  Normal affect.   Detailed Cardiovascular Exam  Neck    Carotids: Carotids full and equal bilaterally without bruits.      Neck Veins: Normal, no JVD.    Heart    Inspection: PMI visualized and displaced almost to the axilla and broadly sustained    Palpation:  PMI palpated and displaced almost to the axilla and probably sustained    Auscultation: regular rate and rhythm, S1, S2 without murmurs, rubs.  Positive S3  Vascular    Abdominal Aorta: no palpable masses, pulsations, or audible bruits.      Femoral Pulses: normal femoral pulses bilaterally.      Pedal Pulses: normal pedal pulses bilaterally.      Radial Pulses: normal radial pulses bilaterally.      Peripheral Circulation: no clubbing, cyanosis, or edema noted with normal capillary refill.     Impression & Recommendations:  Problem # 1:  CHF (ICD-428.0) Today I will increase his lisinopril 10 mg b.i.d. We will get a basic metabolic profile when he returns for med titration. We discussed the need to continue with medication compliance and to avoid all alcohol and other substances. He understands the grave prognosis if he does not. He will need to be considered for device implantation in the future after med titration.  Problem # 2:  HYPERTENSION (ICD-401.9) His blood pressure will be managed in the context of treating his cardiomyopathy.  Problem # 3:  OTH MIXED/UNSPEC NONDEPENDENT DRUG ABUSE UNSPEC (ICD-305.90) He was counseled on substance abuse and in particular avoidance of alcohol.  Problem # 4:  TOBACCO ABUSE (ICD-305.1) He reports that he quit smoking. I applaud this and encouraged continued abstinence.  Patient Instructions: 1)  Your physician recommends that you schedule a follow-up appointment ON DECEMBER 16TH 2010 @ 11:00AM 2)  Your physician has recommended you make the following change in your medication: INCREASE LISINOPRIL TO 10MG  TWICE DAILY AND START AMBIEN 5MG . THESE  PRESCRIPTIONS HAVE BEEN FAXED TO YOUR PHARMACY Prescriptions: AMBIEN 5 MG TABS (ZOLPIDEM TARTRATE) Take 1 tablet by mouth once a day at bedtime as needed sleep  #30 x 0   Entered and Authorized by:   Rollene Rotunda, MD, [facility]   Signed by:   Rollene Rotunda, MD, [facility] on 10/08/2009   Method used:   Printed then faxed to ...       [facility] [phone]* (retail)       [address]       [place], [place]  [address]       Ph: [phone]       Fax: [phone]   RxID:   [id number] LISINOPRIL 10 MG TABS (LISINOPRIL) Take 1 tablet by mouth two times a day  #60 x 6   Entered and Authorized by:   Rollene Rotunda, MD, [facility] [place] [facility]   Signed by:   Rollene Rotunda, MD, [facility] on 10/08/2009   Method used:  Printed then faxed to ...       [facility] [phone]* (retail)       [address]       [place], [place]  [address]       Ph: [phone]       Fax: [phone]   RxID:   [id number]

## 2011-01-08 NOTE — Miscellaneous (Signed)
Summary: Home Care Report/ [facility]  Home Care Report/ [facility]   Imported By: Dorise Hiss 11/25/2009 16:46:15  _____________________________________________________________________  External Attachment:    Type:   Image     Comment:   External Document

## 2011-01-08 NOTE — Consult Note (Signed)
Summary: Consultation Report  Consultation Report   Imported By: Zachary George 10/08/2009 08:37:10  _____________________________________________________________________  External Attachment:    Type:   Image     Comment:   External Document

## 2011-01-09 NOTE — Cardiovascular Report (Signed)
Summary: Office Visit   Office Visit   Imported By: Roderic Ovens 09/03/2010 15:24:59  _____________________________________________________________________  External Attachment:    Type:   Image     Comment:   External Document

## 2011-02-19 LAB — SURGICAL PCR SCREEN
MRSA, PCR: NEGATIVE
Staphylococcus aureus: NEGATIVE

## 2011-03-12 LAB — BASIC METABOLIC PANEL
BUN: 10 mg/dL (ref 6–23)
BUN: 13 mg/dL (ref 6–23)
BUN: 15 mg/dL (ref 6–23)
BUN: 17 mg/dL (ref 6–23)
BUN: 18 mg/dL (ref 6–23)
BUN: 19 mg/dL (ref 6–23)
BUN: 19 mg/dL (ref 6–23)
BUN: 20 mg/dL (ref 6–23)
BUN: 25 mg/dL — ABNORMAL HIGH (ref 6–23)
CO2: 21 mEq/L (ref 19–32)
CO2: 24 mEq/L (ref 19–32)
CO2: 24 mEq/L (ref 19–32)
CO2: 24 mEq/L (ref 19–32)
CO2: 25 mEq/L (ref 19–32)
CO2: 25 mEq/L (ref 19–32)
CO2: 27 mEq/L (ref 19–32)
CO2: 27 mEq/L (ref 19–32)
CO2: 28 mEq/L (ref 19–32)
Calcium: 8.8 mg/dL (ref 8.4–10.5)
Calcium: 8.9 mg/dL (ref 8.4–10.5)
Calcium: 9.1 mg/dL (ref 8.4–10.5)
Calcium: 9.2 mg/dL (ref 8.4–10.5)
Calcium: 9.2 mg/dL (ref 8.4–10.5)
Calcium: 9.4 mg/dL (ref 8.4–10.5)
Calcium: 9.4 mg/dL (ref 8.4–10.5)
Calcium: 9.7 mg/dL (ref 8.4–10.5)
Calcium: 9.8 mg/dL (ref 8.4–10.5)
Chloride: 100 mEq/L (ref 96–112)
Chloride: 100 mEq/L (ref 96–112)
Chloride: 101 mEq/L (ref 96–112)
Chloride: 102 mEq/L (ref 96–112)
Chloride: 102 mEq/L (ref 96–112)
Chloride: 103 mEq/L (ref 96–112)
Chloride: 96 mEq/L (ref 96–112)
Chloride: 99 mEq/L (ref 96–112)
Chloride: 99 mEq/L (ref 96–112)
Creatinine, Ser: 0.95 mg/dL (ref 0.4–1.5)
Creatinine, Ser: 1.03 mg/dL (ref 0.4–1.5)
Creatinine, Ser: 1.04 mg/dL (ref 0.4–1.5)
Creatinine, Ser: 1.08 mg/dL (ref 0.4–1.5)
Creatinine, Ser: 1.17 mg/dL (ref 0.4–1.5)
Creatinine, Ser: 1.19 mg/dL (ref 0.4–1.5)
Creatinine, Ser: 1.2 mg/dL (ref 0.4–1.5)
Creatinine, Ser: 1.24 mg/dL (ref 0.4–1.5)
Creatinine, Ser: 1.39 mg/dL (ref 0.4–1.5)
GFR calc Af Amer: >60 mL/min (ref >60)
GFR calc Af Amer: >60 mL/min (ref >60)
GFR calc Af Amer: >60 mL/min (ref >60)
GFR calc Af Amer: >60 mL/min (ref >60)
GFR calc Af Amer: >60 mL/min (ref >60)
GFR calc Af Amer: >60 mL/min (ref >60)
GFR calc Af Amer: >60 mL/min (ref >60)
GFR calc Af Amer: >60 mL/min (ref >60)
GFR calc Af Amer: >60 mL/min (ref >60)
GFR calc non Af Amer: 55 mL/min — ABNORMAL LOW (ref >60)
GFR calc non Af Amer: >60 mL/min (ref >60)
GFR calc non Af Amer: >60 mL/min (ref >60)
GFR calc non Af Amer: >60 mL/min (ref >60)
GFR calc non Af Amer: >60 mL/min (ref >60)
GFR calc non Af Amer: >60 mL/min (ref >60)
GFR calc non Af Amer: >60 mL/min (ref >60)
GFR calc non Af Amer: >60 mL/min (ref >60)
GFR calc non Af Amer: >60 mL/min (ref >60)
Glucose, Bld: 100 mg/dL — ABNORMAL HIGH (ref 70–99)
Glucose, Bld: 103 mg/dL — ABNORMAL HIGH (ref 70–99)
Glucose, Bld: 105 mg/dL — ABNORMAL HIGH (ref 70–99)
Glucose, Bld: 107 mg/dL — ABNORMAL HIGH (ref 70–99)
Glucose, Bld: 108 mg/dL — ABNORMAL HIGH (ref 70–99)
Glucose, Bld: 84 mg/dL (ref 70–99)
Glucose, Bld: 85 mg/dL (ref 70–99)
Glucose, Bld: 87 mg/dL (ref 70–99)
Glucose, Bld: 87 mg/dL (ref 70–99)
Potassium: 3.5 mEq/L (ref 3.5–5.1)
Potassium: 3.7 mEq/L (ref 3.5–5.1)
Potassium: 3.9 mEq/L (ref 3.5–5.1)
Potassium: 4.1 mEq/L (ref 3.5–5.1)
Potassium: 4.2 mEq/L (ref 3.5–5.1)
Potassium: 4.2 mEq/L (ref 3.5–5.1)
Potassium: 4.2 mEq/L (ref 3.5–5.1)
Potassium: 4.4 mEq/L (ref 3.5–5.1)
Potassium: 4.8 mEq/L (ref 3.5–5.1)
Sodium: 130 mEq/L — ABNORMAL LOW (ref 135–145)
Sodium: 131 mEq/L — ABNORMAL LOW (ref 135–145)
Sodium: 133 mEq/L — ABNORMAL LOW (ref 135–145)
Sodium: 133 mEq/L — ABNORMAL LOW (ref 135–145)
Sodium: 135 mEq/L (ref 135–145)
Sodium: 135 mEq/L (ref 135–145)
Sodium: 137 mEq/L (ref 135–145)
Sodium: 137 mEq/L (ref 135–145)
Sodium: 137 mEq/L (ref 135–145)

## 2011-03-12 LAB — CBC
HCT: 41.6 % (ref 39.0–52.0)
HCT: 44.7 % (ref 39.0–52.0)
HCT: 45.4 % (ref 39.0–52.0)
HCT: 45.7 % (ref 39.0–52.0)
HCT: 45.8 % (ref 39.0–52.0)
Hemoglobin: 14.2 g/dL (ref 13.0–17.0)
Hemoglobin: 15.4 g/dL (ref 13.0–17.0)
Hemoglobin: 15.6 g/dL (ref 13.0–17.0)
Hemoglobin: 15.6 g/dL (ref 13.0–17.0)
Hemoglobin: 15.7 g/dL (ref 13.0–17.0)
MCHC: 34 g/dL (ref 30.0–36.0)
MCHC: 34.1 g/dL (ref 30.0–36.0)
MCHC: 34.2 g/dL (ref 30.0–36.0)
MCHC: 34.4 g/dL (ref 30.0–36.0)
MCHC: 34.4 g/dL (ref 30.0–36.0)
MCV: 95.7 fL (ref 78.0–100.0)
MCV: 96.2 fL (ref 78.0–100.0)
MCV: 96.4 fL (ref 78.0–100.0)
MCV: 96.8 fL (ref 78.0–100.0)
MCV: 97 fL (ref 78.0–100.0)
Platelets: 207 10*3/uL (ref 150–400)
Platelets: 218 10*3/uL (ref 150–400)
Platelets: 224 10*3/uL (ref 150–400)
Platelets: 252 10*3/uL (ref 150–400)
Platelets: 269 10*3/uL (ref 150–400)
RBC: 4.3 MIL/uL (ref 4.22–5.81)
RBC: 4.64 MIL/uL (ref 4.22–5.81)
RBC: 4.72 MIL/uL (ref 4.22–5.81)
RBC: 4.74 MIL/uL (ref 4.22–5.81)
RBC: 4.75 MIL/uL (ref 4.22–5.81)
RDW: 13.5 % (ref 11.5–15.5)
RDW: 13.5 % (ref 11.5–15.5)
RDW: 13.7 % (ref 11.5–15.5)
RDW: 13.7 % (ref 11.5–15.5)
RDW: 14.1 % (ref 11.5–15.5)
WBC: 10.4 10*3/uL (ref 4.0–10.5)
WBC: 7.3 10*3/uL (ref 4.0–10.5)
WBC: 8.6 10*3/uL (ref 4.0–10.5)
WBC: 9.5 10*3/uL (ref 4.0–10.5)
WBC: 9.5 10*3/uL (ref 4.0–10.5)

## 2011-03-12 LAB — CARBOXYHEMOGLOBIN
Carboxyhemoglobin: 0.8 % (ref 0.5–1.5)
Carboxyhemoglobin: 0.8 % (ref 0.5–1.5)
Carboxyhemoglobin: 0.9 % (ref 0.5–1.5)
Carboxyhemoglobin: 0.9 % (ref 0.5–1.5)
Carboxyhemoglobin: 0.9 % (ref 0.5–1.5)
Carboxyhemoglobin: 1 % (ref 0.5–1.5)
Carboxyhemoglobin: 1 % (ref 0.5–1.5)
Carboxyhemoglobin: 1.1 % (ref 0.5–1.5)
Carboxyhemoglobin: 1.2 % (ref 0.5–1.5)
Methemoglobin: 0.3 % (ref 0.0–1.5)
Methemoglobin: 0.4 % (ref 0.0–1.5)
Methemoglobin: 0.4 % (ref 0.0–1.5)
Methemoglobin: 0.5 % (ref 0.0–1.5)
Methemoglobin: 0.5 % (ref 0.0–1.5)
Methemoglobin: 0.7 % (ref 0.0–1.5)
Methemoglobin: 0.9 % (ref 0.0–1.5)
Methemoglobin: 1 % (ref 0.0–1.5)
Methemoglobin: 1 % (ref 0.0–1.5)
O2 Saturation: 41.7 %
O2 Saturation: 47.7 %
O2 Saturation: 48.6 %
O2 Saturation: 50.1 %
O2 Saturation: 52.4 %
O2 Saturation: 53.9 %
O2 Saturation: 57.7 %
O2 Saturation: 60 %
O2 Saturation: 70.3 %
Total hemoglobin: 13.4 g/dL — ABNORMAL LOW (ref 13.5–18.0)
Total hemoglobin: 14.1 g/dL (ref 13.5–18.0)
Total hemoglobin: 14.1 g/dL (ref 13.5–18.0)
Total hemoglobin: 14.4 g/dL (ref 13.5–18.0)
Total hemoglobin: 14.5 g/dL (ref 13.5–18.0)
Total hemoglobin: 14.7 g/dL (ref 13.5–18.0)
Total hemoglobin: 15.1 g/dL (ref 13.5–18.0)
Total hemoglobin: 15.3 g/dL (ref 13.5–18.0)
Total hemoglobin: 15.5 g/dL (ref 13.5–18.0)

## 2011-03-12 LAB — CARDIAC PANEL(CRET KIN+CKTOT+MB+TROPI)
CK, MB: 1.9 ng/mL (ref 0.3–4.0)
CK, MB: 2.2 ng/mL (ref 0.3–4.0)
CK, MB: 2.5 ng/mL (ref 0.3–4.0)
Relative Index: INVALID (ref 0.0–2.5)
Relative Index: INVALID (ref 0.0–2.5)
Relative Index: INVALID (ref 0.0–2.5)
Total CK: 49 U/L (ref 7–232)
Total CK: 52 U/L (ref 7–232)
Total CK: 71 U/L (ref 7–232)
Troponin I: 0.18 ng/mL — ABNORMAL HIGH (ref 0.00–0.06)
Troponin I: 0.23 ng/mL — ABNORMAL HIGH (ref 0.00–0.06)
Troponin I: 0.31 ng/mL — ABNORMAL HIGH (ref 0.00–0.06)

## 2011-03-12 LAB — DIFFERENTIAL
Band Neutrophils: 0 % (ref 0–10)
Basophils Absolute: 0.1 10*3/uL (ref 0.0–0.1)
Basophils Relative: 1 % (ref 0–1)
Blasts: 0 %
Eosinophils Absolute: 0.1 10*3/uL (ref 0.0–0.7)
Eosinophils Relative: 1 % (ref 0–5)
Lymphocytes Relative: 38 % (ref 12–46)
Lymphs Abs: 3.3 10*3/uL (ref 0.7–4.0)
Metamyelocytes Relative: 0 %
Monocytes Absolute: 0.5 10*3/uL (ref 0.1–1.0)
Monocytes Relative: 6 % (ref 3–12)
Myelocytes: 0 %
Neutro Abs: 4.6 10*3/uL (ref 1.7–7.7)
Neutrophils Relative %: 54 % (ref 43–77)
Promyelocytes Absolute: 0 %
nRBC: 0 /100 WBC

## 2011-03-12 LAB — POCT I-STAT 3, VENOUS BLOOD GAS (G3P V)
Acid-base deficit: 1 mmol/L (ref 0.0–2.0)
Bicarbonate: 23.1 mEq/L (ref 20.0–24.0)
O2 Saturation: 54 %
TCO2: 24 mmol/L (ref 0–100)
pCO2, Ven: 36.5 mmHg — ABNORMAL LOW (ref 45.0–50.0)
pH, Ven: 7.41 — ABNORMAL HIGH (ref 7.250–7.300)
pO2, Ven: 28 mmHg — CL (ref 30.0–45.0)

## 2011-03-12 LAB — URINE DRUGS OF ABUSE SCREEN W ALC, ROUTINE (REF LAB)
Amphetamine Screen, Ur: NEGATIVE
Barbiturate Quant, Ur: NEGATIVE
Benzodiazepines.: NEGATIVE
Cocaine Metabolites: NEGATIVE
Creatinine,U: 131.1 mg/dL
Ethyl Alcohol: <10 mg/dL (ref <10)
Marijuana Metabolite: POSITIVE — AB
Methadone: NEGATIVE
Opiate Screen, Urine: NEGATIVE
Phencyclidine (PCP): NEGATIVE
Propoxyphene: POSITIVE — AB

## 2011-03-12 LAB — COMPREHENSIVE METABOLIC PANEL
ALT: 14 U/L (ref 0–53)
ALT: 17 U/L (ref 0–53)
AST: 14 U/L (ref 0–37)
AST: 15 U/L (ref 0–37)
Albumin: 3.3 g/dL — ABNORMAL LOW (ref 3.5–5.2)
Albumin: 3.9 g/dL (ref 3.5–5.2)
Alkaline Phosphatase: 46 U/L (ref 39–117)
Alkaline Phosphatase: 58 U/L (ref 39–117)
BUN: 10 mg/dL (ref 6–23)
BUN: 26 mg/dL — ABNORMAL HIGH (ref 6–23)
CO2: 23 mEq/L (ref 19–32)
CO2: 24 mEq/L (ref 19–32)
Calcium: 8.2 mg/dL — ABNORMAL LOW (ref 8.4–10.5)
Calcium: 9.2 mg/dL (ref 8.4–10.5)
Chloride: 100 mEq/L (ref 96–112)
Chloride: 103 mEq/L (ref 96–112)
Creatinine, Ser: 1.09 mg/dL (ref 0.4–1.5)
Creatinine, Ser: 1.3 mg/dL (ref 0.4–1.5)
GFR calc Af Amer: >60 mL/min (ref >60)
GFR calc Af Amer: >60 mL/min (ref >60)
GFR calc non Af Amer: 60 mL/min — ABNORMAL LOW (ref >60)
GFR calc non Af Amer: >60 mL/min (ref >60)
Glucose, Bld: 115 mg/dL — ABNORMAL HIGH (ref 70–99)
Glucose, Bld: 173 mg/dL — ABNORMAL HIGH (ref 70–99)
Potassium: 3.5 mEq/L (ref 3.5–5.1)
Potassium: 4 mEq/L (ref 3.5–5.1)
Sodium: 130 mEq/L — ABNORMAL LOW (ref 135–145)
Sodium: 138 mEq/L (ref 135–145)
Total Bilirubin: 0.7 mg/dL (ref 0.3–1.2)
Total Bilirubin: 1.2 mg/dL (ref 0.3–1.2)
Total Protein: 6.1 g/dL (ref 6.0–8.3)
Total Protein: 7 g/dL (ref 6.0–8.3)

## 2011-03-12 LAB — BRAIN NATRIURETIC PEPTIDE
Pro B Natriuretic peptide (BNP): 1609 pg/mL — ABNORMAL HIGH (ref 0.0–100.0)
Pro B Natriuretic peptide (BNP): 1817 pg/mL — ABNORMAL HIGH (ref 0.0–100.0)
Pro B Natriuretic peptide (BNP): 2340 pg/mL — ABNORMAL HIGH (ref 0.0–100.0)

## 2011-03-12 LAB — PROTIME-INR
INR: 1.09 (ref 0.00–1.49)
Prothrombin Time: 14 seconds (ref 11.6–15.2)

## 2011-03-12 LAB — POCT I-STAT 3, ART BLOOD GAS (G3+)
Acid-base deficit: 4 mmol/L — ABNORMAL HIGH (ref 0.0–2.0)
Bicarbonate: 19.1 mEq/L — ABNORMAL LOW (ref 20.0–24.0)
O2 Saturation: 95 %
TCO2: 20 mmol/L (ref 0–100)
pCO2 arterial: 28.6 mmHg — ABNORMAL LOW (ref 35.0–45.0)
pH, Arterial: 7.432 (ref 7.350–7.450)
pO2, Arterial: 72 mmHg — ABNORMAL LOW (ref 80.0–100.0)

## 2011-03-12 LAB — MAGNESIUM: Magnesium: 2 mg/dL (ref 1.5–2.5)

## 2011-03-12 LAB — PROPOXYPHENE, CONFIRMATION
Propoxyphene Metabolite: 56000 ng/mL
Propoxyphene or Metab. GC/MS: 4900 ng/mL

## 2011-03-12 LAB — DIGOXIN LEVEL: Digoxin Level: 0.7 ng/mL — ABNORMAL LOW (ref 0.8–2.0)

## 2011-03-12 LAB — APTT: aPTT: 35 seconds (ref 24–37)

## 2011-03-12 LAB — THC (MARIJUANA), URINE, CONFIRMATION: Marijuana, Ur-Confirmation: 33 ng/mL

## 2011-03-18 ENCOUNTER — Encounter: Payer: Self-pay | Admitting: *Deleted

## 2011-04-17 ENCOUNTER — Encounter: Payer: Self-pay | Admitting: *Deleted

## 2011-06-04 ENCOUNTER — Encounter: Payer: Self-pay | Admitting: Internal Medicine

## 2011-07-17 ENCOUNTER — Other Ambulatory Visit: Payer: Self-pay | Admitting: *Deleted

## 2011-07-17 NOTE — Progress Notes (Signed)
Error

## 2011-07-21 ENCOUNTER — Other Ambulatory Visit: Payer: Self-pay | Admitting: *Deleted

## 2011-07-21 MED ORDER — DIGOXIN 250 MCG PO TABS: 250.0000 ug | ORAL_TABLET | Freq: Every day | ORAL | Status: DC

## 2011-07-31 ENCOUNTER — Ambulatory Visit (INDEPENDENT_AMBULATORY_CARE_PROVIDER_SITE_OTHER): Payer: Medicaid - Out of State | Admitting: Internal Medicine

## 2011-07-31 ENCOUNTER — Encounter: Payer: Self-pay | Admitting: Internal Medicine

## 2011-07-31 DIAGNOSIS — I5022 Chronic systolic (congestive) heart failure: Secondary | ICD-10-CM

## 2011-07-31 DIAGNOSIS — F17200 Nicotine dependence, unspecified, uncomplicated: Secondary | ICD-10-CM

## 2011-07-31 DIAGNOSIS — I509 Heart failure, unspecified: Secondary | ICD-10-CM

## 2011-07-31 DIAGNOSIS — I10 Essential (primary) hypertension: Secondary | ICD-10-CM

## 2011-07-31 DIAGNOSIS — I428 Other cardiomyopathies: Secondary | ICD-10-CM

## 2011-07-31 LAB — ICD DEVICE OBSERVATION
BRDY-0002RV: 40 {beats}/min
CHARGE TIME: 8.9 s
DEV-0020ICD: NEGATIVE
DEVICE MODEL ICD: 614505
FVT: 0
HV IMPEDENCE: 78 Ohm
PACEART VT: 0
RV LEAD AMPLITUDE: 11.8 mv
RV LEAD IMPEDENCE ICD: 650 Ohm
RV LEAD THRESHOLD: 0.5 V
TOT-0007: 1
TOT-0008: 0
TOT-0009: 1
TOT-0010: 4
TZON-0003SLOWVT: 340 ms
TZON-0004SLOWVT: 20
TZON-0005SLOWVT: 6
TZON-0010SLOWVT: 80 ms
VENTRICULAR PACING ICD: 0.02 pct
VF: 0

## 2011-07-31 MED ORDER — FUROSEMIDE 40 MG PO TABS: 40.0000 mg | ORAL_TABLET | Freq: Every day | ORAL | Status: DC

## 2011-07-31 MED ORDER — SPIRONOLACTONE 25 MG PO TABS: 25.0000 mg | ORAL_TABLET | Freq: Every day | ORAL | Status: DC

## 2011-07-31 MED ORDER — DIGOXIN 250 MCG PO TABS: 250.0000 ug | ORAL_TABLET | Freq: Every day | ORAL | Status: DC

## 2011-07-31 MED ORDER — CARVEDILOL 6.25 MG PO TABS: 6.2500 mg | ORAL_TABLET | Freq: Two times a day (BID) | ORAL | Status: DC

## 2011-08-03 ENCOUNTER — Encounter: Payer: Self-pay | Admitting: Internal Medicine

## 2011-08-03 NOTE — Assessment & Plan Note (Signed)
encourged to quit

## 2011-08-03 NOTE — Progress Notes (Signed)
The patient presents today for routine electrophysiology followup.  Since last being seen in our clinic, the patient reports doing very well.  Today, he denies symptoms of palpitations, chest pain, shortness of breath, orthopnea, PND, lower extremity edema, dizziness, presyncope, syncope, or neurologic sequela.  The patient feels that he is tolerating medications without difficulties and is otherwise without complaint today.   Past Medical History  Diagnosis Date  . Hypertension   . Tobacco user   . Gout   . Systolic heart failure     Secondary to nonischemic cardiomyopathy  . Polysubstance abuse     In remission since 2003  . Renal insufficiency    Past Surgical History  Procedure Date  . Thoracotomy 9/09    Left; for rib fracture and associated pneumothorax  . Cervical spine surgery   . Lumbar spine surgery   . Hernia repair     Bilateral  . Cardiac defibrillator placement     SJM ICD implanted by [facility] 08/15/10    Current Outpatient Prescriptions  Medication Sig Dispense Refill  . aspirin 81 MG EC tablet Take 81 mg by mouth daily.        . carvedilol (COREG) 6.25 MG tablet Take 1 tablet (6.25 mg total) by mouth 2 (two) times daily.  60 tablet  6  . digoxin (LANOXIN) 0.25 MG tablet Take 1 tablet (250 mcg total) by mouth daily.  30 tablet  6  . furosemide (LASIX) 40 MG tablet Take 1 tablet (40 mg total) by mouth daily.  30 tablet  6  . indomethacin (INDOCIN) 25 MG capsule Take 25 mg by mouth daily as needed.        . Melatonin 5 MG TABS Take 1 tablet by mouth daily.        . multivitamin (THERAGRAN) per tablet Take 1 tablet by mouth daily.        Marland Kitchen spironolactone (ALDACTONE) 25 MG tablet Take 1 tablet (25 mg total) by mouth daily.  30 tablet  6    Allergies  Allergen Reactions  . Penicillins     REACTION: Rash    History   Social History  . Marital Status: Single    Spouse Name: N/A    Number of Children: N/A  . Years of Education: N/A   Occupational History  .  Disability    Social History Main Topics  . Smoking status: Current Everyday Smoker -- 0.3 packs/day for 25 years    Types: Cigarettes    Last Attempt to Quit: 12/03/2010  . Smokeless tobacco: Never Used   Comment: encouraged to quit today  . Alcohol Use: 0.0 oz/week    2-3 Cans of beer per week  . Drug Use: Yes    Special: Marijuana     Occasionally  . Sexually Active: Not on file   Other Topics Concern  . Not on file   Social History Narrative   Lives in [place], [place].Went to [employer] for 2.5 years.    Family History  Problem Relation Age of Onset  . Cardiomyopathy Brother     Dilated    ROS-  All systems are reviewed and are negative except as outlined in the HPI above   Physical Exam: Filed Vitals:   07/31/11 1536  BP: 99/67  Pulse: 86  Height: 6\' 1"  (1.854 m)  Weight: 135 lb (61.236 kg)    GEN- The patient is well appearing, alert and oriented x 3 today.   Head- normocephalic, atraumatic Eyes-  Sclera clear, conjunctiva pink Ears- hearing intact Oropharynx- clear Neck- supple, no JVP Lymph- no cervical lymphadenopathy Lungs- Clear to ausculation bilaterally, normal work of breathing Chest- ICD pocket is well healed Heart- Regular rate and rhythm, no murmurs, rubs or gallops, PMI not laterally displaced GI- soft, NT, ND, + BS Extremities- no clubbing, cyanosis, or edema MS- no significant deformity or atrophy Skin- no rash or lesion Psych- euthymic mood, full affect Neuro- strength and sensation are intact  ICD interrogation- reviewed in detail today,  See PACEART report  Assessment and Plan:

## 2011-08-03 NOTE — Assessment & Plan Note (Signed)
Normal ICD function See Pace Art report No changes today  

## 2011-08-03 NOTE — Assessment & Plan Note (Signed)
Stable No change required today  

## 2011-09-30 ENCOUNTER — Ambulatory Visit: Payer: Medicaid - Out of State | Admitting: Cardiology

## 2012-03-10 ENCOUNTER — Telehealth: Payer: Self-pay | Admitting: Internal Medicine

## 2012-03-10 ENCOUNTER — Encounter: Payer: Self-pay | Admitting: Internal Medicine

## 2012-03-10 NOTE — Telephone Encounter (Signed)
03-10-12 sent past due letter for defib ck with device clinic/mt

## 2012-04-07 ENCOUNTER — Other Ambulatory Visit: Payer: Self-pay

## 2012-04-07 MED ORDER — SPIRONOLACTONE 25 MG PO TABS: 25.0000 mg | ORAL_TABLET | Freq: Every day | ORAL | Status: DC

## 2012-04-23 DIAGNOSIS — I472 Ventricular tachycardia, unspecified: Secondary | ICD-10-CM

## 2012-04-23 HISTORY — DX: Ventricular tachycardia: I47.2

## 2012-04-23 HISTORY — DX: Ventricular tachycardia, unspecified: I47.20

## 2012-06-06 ENCOUNTER — Ambulatory Visit (INDEPENDENT_AMBULATORY_CARE_PROVIDER_SITE_OTHER): Payer: Medicaid - Out of State | Admitting: Internal Medicine

## 2012-06-06 ENCOUNTER — Encounter: Payer: Self-pay | Admitting: Internal Medicine

## 2012-06-06 ENCOUNTER — Encounter: Payer: Self-pay | Admitting: *Deleted

## 2012-06-06 ENCOUNTER — Other Ambulatory Visit: Payer: Self-pay | Admitting: Internal Medicine

## 2012-06-06 VITALS — BP 109/74 | HR 72 | Ht 73.0 in | Wt 134.0 lb

## 2012-06-06 DIAGNOSIS — I428 Other cardiomyopathies: Secondary | ICD-10-CM

## 2012-06-06 DIAGNOSIS — R079 Chest pain, unspecified: Secondary | ICD-10-CM

## 2012-06-06 DIAGNOSIS — F17200 Nicotine dependence, unspecified, uncomplicated: Secondary | ICD-10-CM

## 2012-06-06 DIAGNOSIS — I472 Ventricular tachycardia: Secondary | ICD-10-CM

## 2012-06-06 DIAGNOSIS — I509 Heart failure, unspecified: Secondary | ICD-10-CM

## 2012-06-06 DIAGNOSIS — I5022 Chronic systolic (congestive) heart failure: Secondary | ICD-10-CM

## 2012-06-06 DIAGNOSIS — F411 Generalized anxiety disorder: Secondary | ICD-10-CM

## 2012-06-06 LAB — ICD DEVICE OBSERVATION
BRDY-0002RV: 40 {beats}/min
CHARGE TIME: 9.8 s
DEV-0020ICD: NEGATIVE
DEVICE MODEL ICD: 614505
FVT: 0
HV IMPEDENCE: 82 Ohm
PACEART VT: 0
RV LEAD AMPLITUDE: 11.8 mv
RV LEAD IMPEDENCE ICD: 662.5 Ohm
RV LEAD THRESHOLD: 0.5 V
TOT-0007: 1
TOT-0008: 0
TOT-0009: 1
TOT-0010: 8
TZON-0003SLOWVT: 340 ms
TZON-0004SLOWVT: 20
TZON-0005SLOWVT: 6
TZON-0010SLOWVT: 80 ms
VENTRICULAR PACING ICD: 0.02 pct
VF: 1

## 2012-06-06 MED ORDER — CARVEDILOL 12.5 MG PO TABS: 12.5000 mg | ORAL_TABLET | Freq: Two times a day (BID) | ORAL | Status: DC

## 2012-06-06 NOTE — Patient Instructions (Addendum)
   Labs:  BMET, Magnesium, TSH, Digoxin level  [employer] will contact with results  Increase Coreg to 12.5mg  twice a day (may take 2 of the 6.25mg  tabs twice a day till finish current supply) Your physician wants you to follow up in:  1 year.  You will receive a reminder letter in the mail one-two months in advance.  If you don't receive a letter, please call our office to schedule the follow up appointment

## 2012-06-06 NOTE — Progress Notes (Signed)
Primary Cardiologist:  Dr Shirlee Latch  The patient presents today for routine electrophysiology followup.  Since last being seen in our clinic, the patient reports doing reasonably well.  He states that he has used no drugs.  He has ongoing anxiety and continues to smoke.  He is not willing to quit.  He reports that on 04/23/12, he while sleeping that he woke with a "jolt".  He states that his L chest was sore for several days thereafter.  He has occasional chest pain.  Today, he denies symptoms of palpitations,  shortness of breath, orthopnea, PND, lower extremity edema, dizziness, presyncope, syncope, or neurologic sequela.  The patient feels that he is tolerating medications without difficulties and is otherwise without complaint today.   Past Medical History  Diagnosis Date  . Hypertension   . Tobacco user   . Gout   . Systolic heart failure     Secondary to nonischemic cardiomyopathy  . Polysubstance abuse     In remission since 2003  . Renal insufficiency   . Ventricular tachycardia 04/23/12    CL with appropriate ICD shock delivered   Past Surgical History  Procedure Date  . Thoracotomy 9/09    Left; for rib fracture and associated pneumothorax  . Cervical spine surgery   . Lumbar spine surgery   . Hernia repair     Bilateral  . Cardiac defibrillator placement     SJM ICD implanted by JA 08/15/10    Current Outpatient Prescriptions  Medication Sig Dispense Refill  . aspirin 81 MG EC tablet Take 81 mg by mouth daily.        . carvedilol (COREG) 12.5 MG tablet Take 1 tablet (12.5 mg total) by mouth 2 (two) times daily.  60 tablet  6  . digoxin (LANOXIN) 0.25 MG tablet Take 1 tablet (250 mcg total) by mouth daily.  30 tablet  6  . furosemide (LASIX) 40 MG tablet Take 1 tablet (40 mg total) by mouth daily.  30 tablet  6  . Melatonin 5 MG TABS Take 1 tablet by mouth daily.        . multivitamin (THERAGRAN) per tablet Take 1 tablet by mouth daily.        Marland Kitchen spironolactone  (ALDACTONE) 25 MG tablet Take 1 tablet (25 mg total) by mouth daily.  30 tablet  3  . DISCONTD: carvedilol (COREG) 6.25 MG tablet Take 1 tablet (6.25 mg total) by mouth 2 (two) times daily.  60 tablet  6    Allergies  Allergen Reactions  . Penicillins     REACTION: Rash    History   Social History  . Marital Status: Single    Spouse Name: N/A    Number of Children: N/A  . Years of Education: N/A   Occupational History  . Disability    Social History Main Topics  . Smoking status: Current Everyday Smoker -- 0.3 packs/day for 25 years    Types: Cigarettes    Last Attempt to Quit: 12/03/2010  . Smokeless tobacco: Never Used   Comment: encouraged to quit today  . Alcohol Use: 0.0 oz/week    2-3 Cans of beer per week  . Drug Use: Yes    Special: Marijuana     reports that he is in remission  . Sexually Active: Not on file   Other Topics Concern  . Not on file   Social History Narrative   Lives in [place], [place].Went to [employer] for 2.5 years.  Family History  Problem Relation Age of Onset  . Cardiomyopathy Brother     Dilated    ROS-  All systems are reviewed and are negative except as outlined in the HPI above   Physical Exam: Filed Vitals:   06/06/12 1055  BP: 109/74  Pulse: 72  Height: 6\' 1"  (1.854 m)  Weight: 134 lb (60.782 kg)    GEN- The patient is well appearing, alert and oriented x 3 today.   Head- normocephalic, atraumatic Eyes-  Sclera clear, conjunctiva pink Ears- hearing intact Oropharynx- clear Neck- supple, no JVP Lymph- no cervical lymphadenopathy Lungs- Clear to ausculation bilaterally, normal work of breathing Chest- ICD pocket is well healed Heart- Regular rate and rhythm, no murmurs, rubs or gallops, PMI not laterally displaced GI- soft, NT, ND, + BS Extremities- no clubbing, cyanosis, or edema MS- no significant deformity or atrophy Skin- no rash or lesion Psych- euthymic mood, full affect Neuro- strength and sensation are  intact  ICD interrogation- reviewed in detail today,  See PACEART report  Assessment and Plan:

## 2012-06-06 NOTE — Assessment & Plan Note (Signed)
euvolemic The importance of avoidance of cocaine/ drugs/ ETOH was again stressed today  Check BMET, Mg, dig levels

## 2012-06-06 NOTE — Assessment & Plan Note (Signed)
Cessation advised (>3 minutes spent in counselling) He is not ready to quit

## 2012-06-06 NOTE — Assessment & Plan Note (Signed)
He asks today for anti-anxiety medicine for chronic anxiety. I have informed him that I do not prescribe these medicines and that he will need to establish with a PCP for them.

## 2012-06-06 NOTE — Assessment & Plan Note (Signed)
ICD interrogation today reveals that on 04/23/12 at 11:44pm he received an appropriate ICD shock for very fast VT (CL ).  A single 25 J shock successfully terminated VT.  He has had no further episodes. I will check BMET, Mg, TSH, and digoxin levels today. In addition, given recent chest pain I will order a lexiscan myoview (he does not feel that he could walk on a treadmill) He is instructed to not drive x 6 months s/p ICD shock but states that he does not drive anyway.  No device changes today. We will order him a cell phone adapter and then he will be followed on Merlin every 3 months.  I will see him again in 12 months. He will contact me if any problems arise.

## 2012-06-07 ENCOUNTER — Other Ambulatory Visit: Payer: Self-pay | Admitting: *Deleted

## 2012-06-07 MED ORDER — DIGOXIN 250 MCG PO TABS: 250.0000 ug | ORAL_TABLET | Freq: Every day | ORAL | Status: DC

## 2012-06-07 NOTE — Telephone Encounter (Signed)
Fax Received. Refill Completed. Graceann Boileau Chowoe (R.M.A)   

## 2012-07-05 ENCOUNTER — Telehealth: Payer: Self-pay | Admitting: *Deleted

## 2012-07-05 NOTE — Telephone Encounter (Signed)
Call placed to patient regarding labs & stress test that he did not have yet.  (previously scheduled for 7/24)  States he will call back when ready to reschedule as he has difficulty with transportation.  States he is feeling fine now.

## 2012-08-17 ENCOUNTER — Other Ambulatory Visit: Payer: Self-pay

## 2012-08-17 MED ORDER — SPIRONOLACTONE 25 MG PO TABS: 25.0000 mg | ORAL_TABLET | Freq: Every day | ORAL | Status: DC

## 2012-09-12 ENCOUNTER — Encounter: Payer: Medicaid - Out of State | Admitting: *Deleted

## 2012-09-13 ENCOUNTER — Encounter: Payer: Self-pay | Admitting: *Deleted

## 2012-09-13 DIAGNOSIS — Z95810 Presence of automatic (implantable) cardiac defibrillator: Secondary | ICD-10-CM | POA: Insufficient documentation

## 2012-09-16 ENCOUNTER — Telehealth: Payer: Self-pay | Admitting: Internal Medicine

## 2012-09-16 NOTE — Telephone Encounter (Signed)
New Problem:    Patient called in because he received a letter stating that he missed his last transmission.  Patient states that he only has a cell phone and was told that he would be receiving a transmitter but never received it.  Patient has the transmission device but would like to know about the converter.  Please call back.

## 2012-09-16 NOTE — Telephone Encounter (Signed)
Patient needs cell adapter for Merlin.  I will have industry mail one out to him.

## 2012-12-01 ENCOUNTER — Encounter: Payer: Self-pay | Admitting: *Deleted

## 2012-12-16 ENCOUNTER — Telehealth: Payer: Self-pay | Admitting: Internal Medicine

## 2012-12-16 MED ORDER — CARVEDILOL 12.5 MG PO TABS: 12.5000 mg | ORAL_TABLET | Freq: Two times a day (BID) | ORAL | Status: DC

## 2012-12-16 MED ORDER — DIGOXIN 250 MCG PO TABS: 250.0000 ug | ORAL_TABLET | Freq: Every day | ORAL | Status: DC

## 2012-12-16 MED ORDER — SPIRONOLACTONE 25 MG PO TABS: 25.0000 mg | ORAL_TABLET | Freq: Every day | ORAL | Status: DC

## 2012-12-16 NOTE — Telephone Encounter (Signed)
MEDICATION REFILLS NEEDED   [place] called to give me message but forgot to ask patient which pharmacy

## 2012-12-16 NOTE — Telephone Encounter (Signed)
Called patient to confirm which medications he needed and which pharmacy to send the refills to.

## 2012-12-22 ENCOUNTER — Telehealth: Payer: Self-pay | Admitting: *Deleted

## 2012-12-22 NOTE — Telephone Encounter (Signed)
Having trouble with new transmitter. It continues to beep. Please call.

## 2012-12-23 NOTE — Telephone Encounter (Signed)
Spoke w/pt and number was given for tech services. Pt was only pressing white button once not twice. Pt to try and resend.

## 2012-12-23 NOTE — Telephone Encounter (Signed)
Tried returning call to pt. Unable to leave voice mail --voice mail not set up.

## 2012-12-30 ENCOUNTER — Encounter: Payer: Self-pay | Admitting: *Deleted

## 2013-05-31 ENCOUNTER — Encounter: Payer: Medicaid - Out of State | Admitting: Internal Medicine

## 2013-06-21 ENCOUNTER — Encounter: Payer: Self-pay | Admitting: Internal Medicine

## 2013-06-21 ENCOUNTER — Ambulatory Visit (INDEPENDENT_AMBULATORY_CARE_PROVIDER_SITE_OTHER): Payer: Medicaid - Out of State | Admitting: Internal Medicine

## 2013-06-21 VITALS — BP 100/60 | HR 84 | Ht 72.0 in | Wt 131.4 lb

## 2013-06-21 DIAGNOSIS — I5022 Chronic systolic (congestive) heart failure: Secondary | ICD-10-CM

## 2013-06-21 DIAGNOSIS — I509 Heart failure, unspecified: Secondary | ICD-10-CM

## 2013-06-21 DIAGNOSIS — I10 Essential (primary) hypertension: Secondary | ICD-10-CM

## 2013-06-21 DIAGNOSIS — Z95810 Presence of automatic (implantable) cardiac defibrillator: Secondary | ICD-10-CM

## 2013-06-21 DIAGNOSIS — F17200 Nicotine dependence, unspecified, uncomplicated: Secondary | ICD-10-CM

## 2013-06-21 DIAGNOSIS — I472 Ventricular tachycardia: Secondary | ICD-10-CM

## 2013-06-21 DIAGNOSIS — I428 Other cardiomyopathies: Secondary | ICD-10-CM

## 2013-06-21 LAB — ICD DEVICE OBSERVATION
BRDY-0002RV: 40 {beats}/min
CHARGE TIME: 9.9 s
DEV-0020ICD: NEGATIVE
DEVICE MODEL ICD: 614505
FVT: 0
HV IMPEDENCE: 79 Ohm
PACEART VT: 0
RV LEAD AMPLITUDE: 11.8 mv
RV LEAD IMPEDENCE ICD: 600 Ohm
RV LEAD THRESHOLD: 0.5 V
TOT-0007: 1
TOT-0008: 0
TOT-0009: 1
TOT-0010: 11
TZON-0003SLOWVT: 340 ms
TZON-0004SLOWVT: 30
TZON-0005SLOWVT: 6
TZON-0010SLOWVT: 80 ms
VENTRICULAR PACING ICD: 0.05 pct
VF: 0

## 2013-06-21 NOTE — Patient Instructions (Signed)
Continue all current medications. Labs for BMET, Digoxin, Magnesium Office will contact with results via phone or letter.   Kristin - 3 mo Allred - 1 year

## 2013-06-21 NOTE — Addendum Note (Signed)
Addended by: Lesle Chris on: 06/21/2013 02:49 PM   Modules accepted: Orders

## 2013-06-21 NOTE — Progress Notes (Signed)
   Chad Christensen is a 47 y.o. male who presents today for routine electrophysiology followup.  Since last being seen in our clinic, the patient reports doing very well.  Today, he denies symptoms of palpitations, chest pain, shortness of breath,  lower extremity edema, dizziness, presyncope, syncope, or ICD shocks.  The patient is otherwise without complaint today.   Past Medical History  Diagnosis Date  . Hypertension   . Tobacco user   . Gout   . Systolic heart failure     Secondary to nonischemic cardiomyopathy  . Polysubstance abuse     In remission since 2003  . Renal insufficiency   . Ventricular tachycardia 04/23/12    CL with appropriate ICD shock delivered   Past Surgical History  Procedure Laterality Date  . Thoracotomy  9/09    Left; for rib fracture and associated pneumothorax  . Cervical spine surgery    . Lumbar spine surgery    . Hernia repair      Bilateral  . Cardiac defibrillator placement      SJM ICD implanted by [facility] 08/15/10    Current Outpatient Prescriptions  Medication Sig Dispense Refill  . allopurinol (ZYLOPRIM) 300 MG tablet Take 150 mg by mouth 2 (two) times daily.      Marland Kitchen aspirin 81 MG EC tablet Take 81 mg by mouth daily.        . carvedilol (COREG) 12.5 MG tablet Take 1 tablet (12.5 mg total) by mouth 2 (two) times daily.  60 tablet  6  . digoxin (LANOXIN) 0.25 MG tablet Take 1 tablet (250 mcg total) by mouth daily.  30 tablet  6  . furosemide (LASIX) 40 MG tablet Take 1 tablet (40 mg total) by mouth daily.  30 tablet  6  . indomethacin (INDOCIN) 50 MG capsule Take 50 mg by mouth 2 (two) times daily with a meal.      . Melatonin 5 MG TABS Take 1 tablet by mouth daily.        . multivitamin (THERAGRAN) per tablet Take 1 tablet by mouth daily.        Marland Kitchen spironolactone (ALDACTONE) 25 MG tablet Take 1 tablet (25 mg total) by mouth daily.  30 tablet  6   No current facility-administered medications for this visit.    Physical Exam: Filed Vitals:    06/21/13 1330  BP: 100/60  Pulse: 84  Height: 6' (1.829 m)  Weight: 131 lb 6.4 oz (59.603 kg)  SpO2: 97%    GEN- The patient is well appearing, alert and oriented x 3 today.   Head- normocephalic, atraumatic Eyes-  Sclera clear, conjunctiva pink Ears- hearing intact Oropharynx- clear Lungs- Clear to ausculation bilaterally, normal work of breathing Chest- ICD pocket is well healed Heart- Regular rate and rhythm, no murmurs, rubs or gallops, PMI not laterally displaced GI- soft, NT, ND, + BS Extremities- no clubbing, cyanosis, or edema  ICD interrogation- reviewed in detail today,  See PACEART report  Assessment and Plan:  1.  Chronic systolic dysfunction euvolemic today Stable on an appropriate medical regimen Normal ICD function See Pace Art report No medicine changes today Check bmet, mg , and dig level today  2. VT No further VT NIDs adjusted today to minimize shocks  3. HTN Stable No change required today  4. Tobacco Cessation advsied He is not ready to quit

## 2013-07-03 ENCOUNTER — Encounter: Payer: Self-pay | Admitting: Internal Medicine

## 2013-07-05 ENCOUNTER — Encounter: Payer: Self-pay | Admitting: *Deleted

## 2013-07-13 ENCOUNTER — Encounter: Payer: Self-pay | Admitting: *Deleted

## 2013-07-13 NOTE — Progress Notes (Signed)
Patient ID: Chad Christensen, male   DOB: November 13, 1966, 47 y.o.   MRN: [id number]   2nd letter & orders mailed to pt regarding labs requested by Dr. [name] on 06/21/2013.   --agh

## 2013-07-20 ENCOUNTER — Telehealth: Payer: Self-pay | Admitting: *Deleted

## 2013-07-20 NOTE — Telephone Encounter (Signed)
Patient left message on voice mail regarding recent letter he received requesting that he do lab work requested by Dr. [name] in July.  Stated that he would get done when he could, he has a hard time getting transportation down here from [place].  States he is feeling fine & will get done when he can.

## 2013-10-04 ENCOUNTER — Other Ambulatory Visit: Payer: Self-pay | Admitting: Internal Medicine

## 2013-12-11 ENCOUNTER — Other Ambulatory Visit: Payer: Self-pay | Admitting: Internal Medicine

## 2014-01-12 ENCOUNTER — Other Ambulatory Visit: Payer: Self-pay | Admitting: Internal Medicine

## 2014-04-04 ENCOUNTER — Other Ambulatory Visit: Payer: Self-pay | Admitting: Internal Medicine

## 2014-06-21 ENCOUNTER — Encounter: Payer: Medicaid - Out of State | Admitting: Internal Medicine

## 2014-06-26 ENCOUNTER — Encounter: Payer: Self-pay | Admitting: Internal Medicine

## 2014-06-26 ENCOUNTER — Other Ambulatory Visit: Payer: Self-pay | Admitting: *Deleted

## 2014-06-26 ENCOUNTER — Ambulatory Visit (INDEPENDENT_AMBULATORY_CARE_PROVIDER_SITE_OTHER): Payer: Self-pay | Admitting: Internal Medicine

## 2014-06-26 VITALS — BP 106/68 | HR 72 | Ht 72.0 in | Wt 128.0 lb

## 2014-06-26 DIAGNOSIS — I4729 Other ventricular tachycardia: Secondary | ICD-10-CM

## 2014-06-26 DIAGNOSIS — Z95810 Presence of automatic (implantable) cardiac defibrillator: Secondary | ICD-10-CM

## 2014-06-26 DIAGNOSIS — I5022 Chronic systolic (congestive) heart failure: Secondary | ICD-10-CM

## 2014-06-26 DIAGNOSIS — I509 Heart failure, unspecified: Secondary | ICD-10-CM

## 2014-06-26 DIAGNOSIS — I472 Ventricular tachycardia, unspecified: Secondary | ICD-10-CM

## 2014-06-26 DIAGNOSIS — I10 Essential (primary) hypertension: Secondary | ICD-10-CM

## 2014-06-26 DIAGNOSIS — I428 Other cardiomyopathies: Secondary | ICD-10-CM

## 2014-06-26 DIAGNOSIS — F17200 Nicotine dependence, unspecified, uncomplicated: Secondary | ICD-10-CM

## 2014-06-26 LAB — MDC_IDC_ENUM_SESS_TYPE_INCLINIC
Battery Remaining Longevity: 74.4 mo
Brady Statistic RV Percent Paced: 0 %
Date Time Interrogation Session: 20150721095143
HighPow Impedance: 79.875
Implantable Pulse Generator Serial Number: 614505
Lead Channel Impedance Value: 625 Ohm
Lead Channel Pacing Threshold Amplitude: 0.5 V
Lead Channel Pacing Threshold Amplitude: 0.5 V
Lead Channel Pacing Threshold Pulse Width: 0.5 ms
Lead Channel Pacing Threshold Pulse Width: 0.5 ms
Lead Channel Sensing Intrinsic Amplitude: 11.8 mV
Lead Channel Setting Pacing Amplitude: 2.5 V
Lead Channel Setting Pacing Pulse Width: 0.5 ms
Lead Channel Setting Sensing Sensitivity: 0.5 mV
Zone Setting Detection Interval: 270 ms
Zone Setting Detection Interval: 340 ms

## 2014-06-26 NOTE — Addendum Note (Signed)
Addended by: Eustace Moore on: 06/26/2014 10:22 AM   Modules accepted: Orders

## 2014-06-26 NOTE — Progress Notes (Signed)
  Chad Christensen is a 48 y.o. male who presents today for routine electrophysiology followup.  Since last being seen in our clinic, the patient reports doing very well.  Today, he denies symptoms of palpitations, chest pain, shortness of breath,  lower extremity edema, dizziness, presyncope, syncope, or ICD shocks.  The patient is otherwise without complaint today.   Past Medical History  Diagnosis Date  . Hypertension   . Tobacco user   . Gout   . Systolic heart failure     Secondary to nonischemic cardiomyopathy  . Polysubstance abuse     In remission since 2003  . Renal insufficiency   . Ventricular tachycardia 04/23/12    CL with appropriate ICD shock delivered   Past Surgical History  Procedure Laterality Date  . Thoracotomy  9/09    Left; for rib fracture and associated pneumothorax  . Cervical spine surgery    . Lumbar spine surgery    . Hernia repair      Bilateral  . Cardiac defibrillator placement      SJM ICD implanted by [facility] 08/15/10    Current Outpatient Prescriptions  Medication Sig Dispense Refill  . aspirin 81 MG EC tablet Take 81 mg by mouth daily.        . carvedilol (COREG) 12.5 MG tablet TAKE 1 TABLET BY MOUTH  DAILY      . DIGOX 250 MCG tablet TAKE 1 TABLET BY MOUTH EVERY DAY  30 tablet  6  . furosemide (LASIX) 40 MG tablet Take 40 mg by mouth as needed.      . Melatonin 5 MG TABS Take 3 tablets by mouth daily.       . multivitamin (THERAGRAN) per tablet Take 1 tablet by mouth daily.        Marland Kitchen spironolactone (ALDACTONE) 25 MG tablet TAKE 1 TABLET BY MOUTH EVERY DAY  30 tablet  6   No current facility-administered medications for this visit.    Physical Exam: Filed Vitals:   06/26/14 0937  BP: 106/68  Pulse: 72  Height: 6' (1.829 m)  Weight: 128 lb (58.06 kg)    GEN- The patient is well appearing, alert and oriented x 3 today.   Head- normocephalic, atraumatic Eyes-  Sclera clear, conjunctiva pink Ears- hearing intact Oropharynx-  clear Lungs- Clear to ausculation bilaterally, normal work of breathing Chest- ICD pocket is well healed Heart- Regular rate and rhythm, no murmurs, rubs or gallops, PMI not laterally displaced GI- soft, NT, ND, + BS Extremities- no clubbing, cyanosis, or edema  ICD interrogation- reviewed in detail today,  See PACEART report  Assessment and Plan:  1.  Chronic systolic dysfunction euvolemic today Stable on an appropriate medical regimen Normal ICD function See Pace Art report No medicine changes today Check bmet and dig level today--> he did not comply with labs last year when ordered I will enroll in [facility] clinic with Sherri Rad The importance of compliance with remote monitoring was stressed today  2. VT No further VT since last interrogation Remote monitoring is encouraged  3. HTN Stable No change required today  4. Tobacco Cessation advsied He is not ready to quit  Merlin Return to see me in 1 year I have stressed importance of compliance I will refer to Dr Purvis Sheffield for general cardiology care

## 2014-06-26 NOTE — Patient Instructions (Addendum)
   Merlin Device check from home on October 22,2015. Your physician recommends that you schedule a follow-up appointment in: 1 year with Dr. [name]. You will receive a reminder letter in the mail in about 10 months reminding you to call and schedule your appointment. If you don't receive this letter, please contact our office. Your physician recommends that you schedule a follow-up appointment in: 6 months with Dr. Purvis Sheffield. You will receive a reminder letter in the mail in 4 about months reminding you to call and schedule your appointment. If you don't receive this letter, please contact our office. Your physician recommends that you continue on your current medications as directed. Please refer to the Current Medication list given to you today. Your physician recommends that you have lab work today to check your BMET and digoxin levels.

## 2014-06-28 ENCOUNTER — Telehealth: Payer: Self-pay | Admitting: *Deleted

## 2014-07-15 ENCOUNTER — Other Ambulatory Visit: Payer: Self-pay | Admitting: Internal Medicine

## 2014-07-16 NOTE — Telephone Encounter (Signed)
Spoke with patient and he said that he has not been able to get transportation to have his lab work done but he plans to have done this week. Nurse advised patient that he can have it done in [place] if that was more convenient for him. Patient said he would let nurse know if he needed lab orders faxed to [facility].

## 2014-08-02 ENCOUNTER — Telehealth: Payer: Self-pay | Admitting: *Deleted

## 2014-08-02 NOTE — Telephone Encounter (Signed)
Patient said he has felt bad since seeing Dr. [name] in July. Patient c/o decreased appetite, dizziness, nausea/vomiting, weight loss, sob and pain under left arm where defibribrillator is located. Patient said he did requested lab work 2 weeks at [facility]. Patient said the symptoms haven't gotten worse but just haven't gone away.  Patient doesn't have a PCP. Nurse advised patient that he needed to see a family doctor or go to the ED for an evaluation. Nurse also informed patient that lab work results would be requested from [facility] for MD to review.

## 2014-08-02 NOTE — Telephone Encounter (Signed)
Patient left a message stating that he hasn't felt good since seeing Dr. [name] back in July. Patient said he has been having dizziness, stomach pain, left should and arm pain.

## 2014-08-02 NOTE — Telephone Encounter (Signed)
Please refer to Dr McDowell/ Robyne Askew to see ASAP for general cardiology evaluation. As he has felt bad for over a month, he should be able to be adequately evaluated in the office rather than the ER.  Also encourage primary care eval.

## 2014-08-02 NOTE — Telephone Encounter (Signed)
Patient informed and verbalized understanding. Due to patient not having a good phone signal, patient said he would call the office back to schedule a visit with a general cardiologist.

## 2014-08-08 ENCOUNTER — Other Ambulatory Visit: Payer: Self-pay | Admitting: Internal Medicine

## 2014-08-10 ENCOUNTER — Other Ambulatory Visit: Payer: Self-pay | Admitting: *Deleted

## 2014-08-10 MED ORDER — FUROSEMIDE 40 MG PO TABS: 40.0000 mg | ORAL_TABLET | ORAL | Status: DC | PRN

## 2014-09-27 ENCOUNTER — Encounter: Payer: Self-pay | Admitting: *Deleted

## 2014-09-27 ENCOUNTER — Telehealth: Payer: Self-pay | Admitting: Cardiology

## 2014-09-27 NOTE — Telephone Encounter (Signed)
Spoke with pt and reminded pt of remote transmission that is due today. He stated that he needed a cell adapter. I informed him that I would mail him one today and verified his mailing address. He verbalized understanding.

## 2014-10-06 ENCOUNTER — Other Ambulatory Visit: Payer: Self-pay | Admitting: Internal Medicine

## 2014-10-08 ENCOUNTER — Encounter: Payer: Self-pay | Admitting: Cardiology

## 2014-10-17 ENCOUNTER — Encounter: Payer: Self-pay | Admitting: Cardiology

## 2014-10-26 ENCOUNTER — Telehealth: Payer: Self-pay | Admitting: Internal Medicine

## 2014-10-26 NOTE — Telephone Encounter (Signed)
Spoke with pt and informed him that I would send him a cell adapter to plug into his home monitor. Verified address and pt verbalized understanding.

## 2014-10-26 NOTE — Telephone Encounter (Signed)
New Msg   Patient trying to send in Pacemaker info but it will not submit due to him only having cell phone. Please call a [phone]

## 2014-11-12 ENCOUNTER — Other Ambulatory Visit: Payer: Self-pay | Admitting: Internal Medicine

## 2014-12-24 ENCOUNTER — Telehealth: Payer: Self-pay | Admitting: Internal Medicine

## 2014-12-24 NOTE — Telephone Encounter (Signed)
Patient wants to order something that goes between his cell phone and device?

## 2014-12-26 NOTE — Telephone Encounter (Signed)
Mr. Chad Christensen called stating that he has not received the adapter he requested in November

## 2014-12-26 NOTE — Telephone Encounter (Signed)
Spoke w/ pt and informed him that I would mail him another cell adapter today. He stated that they have been having issues w/ the mail service and their mail has been delivered to the wrong address in the recent past.

## 2015-02-05 ENCOUNTER — Telehealth: Payer: Self-pay | Admitting: Internal Medicine

## 2015-02-05 NOTE — Telephone Encounter (Signed)
Patient was not able to transmit this morning.  Company is sending him a new machine once he receives he will transmit

## 2015-02-06 NOTE — Telephone Encounter (Signed)
Spoke w/pt--instructed pt to send transmission once new transmitter received. Pt understands and will do so.

## 2015-03-10 ENCOUNTER — Other Ambulatory Visit: Payer: Self-pay | Admitting: Internal Medicine

## 2015-04-10 ENCOUNTER — Other Ambulatory Visit: Payer: Self-pay | Admitting: Internal Medicine

## 2015-04-22 ENCOUNTER — Telehealth: Payer: Self-pay | Admitting: *Deleted

## 2015-04-22 ENCOUNTER — Other Ambulatory Visit: Payer: Self-pay | Admitting: *Deleted

## 2015-04-22 DIAGNOSIS — I472 Ventricular tachycardia, unspecified: Secondary | ICD-10-CM

## 2015-04-22 MED ORDER — SPIRONOLACTONE 25 MG PO TABS: 25.0000 mg | ORAL_TABLET | Freq: Every day | ORAL | Status: DC

## 2015-04-22 MED ORDER — DIGOXIN 250 MCG PO TABS: 0.2500 mg | ORAL_TABLET | Freq: Every day | ORAL | Status: DC

## 2015-04-22 NOTE — Telephone Encounter (Signed)
Belenda Cruise spoke to patient earlier about his ICD therapies from 09-2014 and 03/25/15. Patient denied sx's during either episode. Dr.Allred reviewed EGMs from both episodes and requested that patient have a BMP, MAG, and Dig level drawn. I informed patient. I also made sure that pt was aware that he needed to make sure to take his Dig pill in the am to have his labs drawn by lunch. Patient voiced understanding. Patient states that he wants his labs drawn at [facility]. I informed him that I will contact them in the am and fax lab request. Again, patient voiced understanding.

## 2015-04-22 NOTE — Telephone Encounter (Signed)
Pt called to verify transmission was received, pt was called back by device tech

## 2015-04-22 NOTE — Telephone Encounter (Signed)
Dig and spironolactone refills sent to pharmacy

## 2015-04-23 ENCOUNTER — Telehealth: Payer: Self-pay | Admitting: Internal Medicine

## 2015-04-23 NOTE — Telephone Encounter (Signed)
Follow Up       [facility] following up to get lab orders for pt, states they need a signature from the doctor. Please call back and advise or fax to [phone].

## 2015-04-23 NOTE — Telephone Encounter (Signed)
New Message    [facility] received the lab orders on patient but needs the Dr. Lenox Ponds and also needs to know if this is a one time order or standing order . Please give them a call back. Thanks.

## 2015-04-23 NOTE — Telephone Encounter (Signed)
Lab request faxed to [facility]. I confirmed with the hospital that the order was received. I informed patient that it has been sent and that he should go and have his blood drawn around lunchtime. Patient voiced understanding.   I also informed patient that Dr.Allred wanted him to get established w/ a general cardiologist in the [place] office. Patient voiced understanding. Will defer scheduling to [facility].

## 2015-04-23 NOTE — Telephone Encounter (Signed)
Spoke to [place] about needing the signature on the lab request. I informed her that I can have Dr.Allred sign the request tomorrow so I can refax the order. I asked Dorathy Daft if the patient had already been to have his labs drawn. She stated that he had, but they still needed the signed order.

## 2015-04-24 ENCOUNTER — Other Ambulatory Visit: Payer: Self-pay | Admitting: *Deleted

## 2015-04-24 ENCOUNTER — Telehealth: Payer: Self-pay | Admitting: Internal Medicine

## 2015-04-24 DIAGNOSIS — Z79899 Other long term (current) drug therapy: Secondary | ICD-10-CM

## 2015-04-24 NOTE — Telephone Encounter (Signed)
Follow Up        Pt calling stating that he needs a signed lab order from Dr. [name] to be sent to [facility]. Fax # is [phone]. Please call pt back to advise when this is done.

## 2015-04-24 NOTE — Progress Notes (Signed)
Dig level ordered per Trudi Ida

## 2015-04-25 NOTE — Telephone Encounter (Signed)
Lab requests were signed and faxed to [facility]. Confirmed with lab that requests were received.

## 2015-05-15 ENCOUNTER — Ambulatory Visit: Payer: Self-pay | Admitting: Cardiovascular Disease

## 2015-06-03 ENCOUNTER — Encounter: Payer: Self-pay | Admitting: Cardiovascular Disease

## 2015-06-03 ENCOUNTER — Ambulatory Visit (INDEPENDENT_AMBULATORY_CARE_PROVIDER_SITE_OTHER): Payer: Medicare Other | Admitting: Cardiovascular Disease

## 2015-06-03 ENCOUNTER — Encounter: Payer: Self-pay | Admitting: *Deleted

## 2015-06-03 VITALS — BP 108/71 | HR 76 | Ht 72.0 in | Wt 123.0 lb

## 2015-06-03 DIAGNOSIS — I472 Ventricular tachycardia, unspecified: Secondary | ICD-10-CM

## 2015-06-03 DIAGNOSIS — Z95810 Presence of automatic (implantable) cardiac defibrillator: Secondary | ICD-10-CM

## 2015-06-03 DIAGNOSIS — Z720 Tobacco use: Secondary | ICD-10-CM

## 2015-06-03 DIAGNOSIS — R0600 Dyspnea, unspecified: Secondary | ICD-10-CM

## 2015-06-03 DIAGNOSIS — I5022 Chronic systolic (congestive) heart failure: Secondary | ICD-10-CM

## 2015-06-03 DIAGNOSIS — F17200 Nicotine dependence, unspecified, uncomplicated: Secondary | ICD-10-CM

## 2015-06-03 DIAGNOSIS — I429 Cardiomyopathy, unspecified: Secondary | ICD-10-CM

## 2015-06-03 DIAGNOSIS — I10 Essential (primary) hypertension: Secondary | ICD-10-CM

## 2015-06-03 DIAGNOSIS — R0609 Other forms of dyspnea: Secondary | ICD-10-CM

## 2015-06-03 MED ORDER — METOPROLOL SUCCINATE ER 25 MG PO TB24: 25.0000 mg | ORAL_TABLET | Freq: Two times a day (BID) | ORAL | Status: DC

## 2015-06-03 NOTE — Patient Instructions (Signed)
Your physician has requested that you have an echocardiogram. Echocardiography is a painless test that uses sound waves to create images of your heart. It provides your doctor with information about the size and shape of your heart and how well your heart's chambers and valves are working. This procedure takes approximately one hour. There are no restrictions for this procedure. Office will contact with results via phone or letter.   Stop Coreg (Carvedilol) Begin Toprol XL 25mg  twice a day  - new sent to pharmacy today. Continue all other medications.   Follow up in  2 months

## 2015-06-03 NOTE — Progress Notes (Signed)
Patient ID: Chad Christensen, male   DOB: 1966-02-08, 49 y.o.   MRN: [id number]       CARDIOLOGY CONSULT NOTE  Patient ID: Chad Christensen MRN: [id number] DOB/AGE: 09/04/1966 49 y.o.  Admit date: (Not on file) Primary Physician No primary care provider on file.  Reason for Consultation: NICM  HPI: The patient is a 49 year old male with a history of a nonischemic cardiomyopathy, chronic systolic heart failure, hypertension, ventricular tachycardia, and tobacco abuse. He has an ICD and is followed by Dr. [name]. The most recent echocardiogram available is dated 09/26/2009 and demonstrated severely reduced left ventricular systolic function, EF 15-20%, diffuse hypokinesis, and moderate mitral regurgitation. There was also moderate right ventricular dilatation with moderate to severely reduced systolic function.  He tells me he has had 2 shocks from his ICD this year, most recently in May. He also tells me that his magnesium was low when checked in May but these lab results are unavailable to me. He takes carvedilol 12.5 mg daily and developed dizziness with higher doses due to low blood pressure. He said his most recent device shock occurred while he was asleep and woke up with left arm soreness the following morning. He said he worries easily. He was moving furniture over the weekend and developed shortness of breath. He denies leg swelling and paroxysmal nocturnal dyspnea.  ECG performed in the office today demonstrates sinus rhythm with PVCs, nonspecific intraventricular conduction delay, and a diffuse T-wave abnormality.  Allergies  Allergen Reactions  . Penicillins     REACTION: Rash    Current Outpatient Prescriptions  Medication Sig Dispense Refill  . aspirin 81 MG EC tablet Take 81 mg by mouth daily.      . carvedilol (COREG) 12.5 MG tablet TAKE 1 TABLET BY MOUTH  DAILY    . digoxin (DIGOX) 0.25 MG tablet Take 1 tablet (0.25 mg total) by mouth daily. 30 tablet 6  . furosemide  (LASIX) 40 MG tablet Take 1 tablet (40 mg total) by mouth as needed. 30 tablet 3  . Melatonin 5 MG TABS Take 3 tablets by mouth daily.     . multivitamin (THERAGRAN) per tablet Take 1 tablet by mouth daily.      Marland Kitchen spironolactone (ALDACTONE) 25 MG tablet Take 1 tablet (25 mg total) by mouth daily. 30 tablet 6   No current facility-administered medications for this visit.    Past Medical History  Diagnosis Date  . Hypertension   . Tobacco user   . Gout   . Systolic heart failure     Secondary to nonischemic cardiomyopathy  . Polysubstance abuse     In remission since 2003  . Renal insufficiency   . Ventricular tachycardia 04/23/12    CL with appropriate ICD shock delivered    Past Surgical History  Procedure Laterality Date  . Thoracotomy  9/09    Left; for rib fracture and associated pneumothorax  . Cervical spine surgery    . Lumbar spine surgery    . Hernia repair      Bilateral  . Cardiac defibrillator placement      SJM ICD implanted by [facility] 08/15/10    History   Social History  . Marital Status: Single    Spouse Name: N/A  . Number of Children: N/A  . Years of Education: N/A   Occupational History  . Disability    Social History Main Topics  . Smoking status: Current Every Day Smoker -- 0.30 packs/day for 25  years    Types: Cigarettes    Start date: 09/13/1982  . Smokeless tobacco: Never Used     Comment: encouraged to quit today  . Alcohol Use: 0.0 oz/week    2-3 Cans of beer per week  . Drug Use: Yes    Special: Marijuana     Comment: reports that he is in remission  . Sexual Activity: Not on file   Other Topics Concern  . Not on file   Social History Narrative   Lives in [place], [place].   Went to [place] for 2.5 years.     No family history of premature CAD in 1st degree relatives.  Prior to Admission medications   Medication Sig Start Date End Date Taking? Authorizing Provider  aspirin 81 MG EC tablet Take 81 mg by mouth daily.       Historical Provider, MD  carvedilol (COREG) 12.5 MG tablet TAKE 1 TABLET BY MOUTH  DAILY 01/12/14   Hillis Range, MD  carvedilol (COREG) 12.5 MG tablet TAKE 1 TABLET BY MOUTH TWICE DAILY 10/08/14   Hillis Range, MD  digoxin (DIGOX) 0.25 MG tablet Take 1 tablet (0.25 mg total) by mouth daily. 04/22/15   Hillis Range, MD  furosemide (LASIX) 40 MG tablet Take 1 tablet (40 mg total) by mouth as needed. 08/10/14   Hillis Range, MD  Melatonin 5 MG TABS Take 3 tablets by mouth daily.     Historical Provider, MD  multivitamin [facility]) per tablet Take 1 tablet by mouth daily.      Historical Provider, MD  spironolactone (ALDACTONE) 25 MG tablet Take 1 tablet (25 mg total) by mouth daily. 04/22/15   Hillis Range, MD     Review of systems complete and found to be negative unless listed above in HPI     Physical exam Blood pressure 108/71, pulse 76, height 6' (1.829 m), weight 123 lb (55.792 kg). General: NAD Neck: No JVD, no thyromegaly or thyroid nodule.  Lungs: Clear to auscultation bilaterally with normal respiratory effort. CV: Nondisplaced PMI. Regular rate and rhythm with premature contractions noted, normal S1/S2, no S3/S4, no murmur.  No peripheral edema.    Abdomen: Soft, nontender, no hepatosplenomegaly, no distention.  Skin: Intact without lesions or rashes.  Neurologic: Alert and oriented x 3.  Psych: Normal affect. Extremities: No clubbing or cyanosis.  HEENT: Normal.   ECG: Most recent ECG reviewed.  Labs:   Lab Results  Component Value Date   WBC 9.5 10/02/2009   HGB 15.7 10/02/2009   HCT 45.8 10/02/2009   MCV 97.0 10/02/2009   PLT 207 10/02/2009   No results for input(s): NA, K, CL, CO2, BUN, CREATININE, CALCIUM, PROT, BILITOT, ALKPHOS, ALT, AST, GLUCOSE in the last 168 hours.  Invalid input(s): LABALBU Lab Results  Component Value Date   CKTOTAL 49 09/23/2009   CKMB 1.9 09/23/2009   TROPONINI * 09/23/2009    0.18        PERSISTENTLY INCREASED TROPONIN VALUES IN THE  RANGE OF 0.06-0.49 ng/mL CAN BE SEEN IN:       -UNSTABLE ANGINA       -CONGESTIVE HEART FAILURE       -MYOCARDITIS       -CHEST TRAUMA       -ARRYHTHMIAS       -LATE PRESENTING MI       -COPD   CLINICAL FOLLOW-UP RECOMMENDED.   No results found for: CHOL No results found for: HDL No results found for: LDLCALC No  results found for: TRIG No results found for: CHOLHDL No results found for: LDLDIRECT       Studies: No results found.  ASSESSMENT AND PLAN:  1. Nonischemic cardiomyopathy/chronic systolic heart failure, EF 15-20%: Appears to have NYHA class II symptoms. Will obtain echocardiogram to assess for interval change in biventricular function. Maintained on Coreg, digoxin, Lasix, and spironolactone. Given recent device shocks and inability to tolerate higher doses of carvedilol, will d/c Coreg and initiate Toprol-XL 25 mg bid. If he is able to tolerate this, I may increase the dose at his next visit.  2. Essential HTN: Controlled on Coreg and spironolactone. Will monitor given discontinuation of Coreg and initiation of metoprolol succinate.  3. Tobacco abuse  4. ICD: Most recent interrogation I find in the system is dated 06/26/14 and demonstrated normal device function. Reports 2 device shocks this year. Received a new transmitter this year. Follows with Dr. [name]. I will obtain copy of labs from May.  5. Ventricular tachycardia: Obtain copy of labs from May given reportedly low magnesium level and PVC's today.  Dispo: f/u 2 months.   Signed: Prentice Docker, M.D., F.A.C.C.  06/03/2015, 10:37 AM

## 2015-06-04 ENCOUNTER — Encounter: Payer: Self-pay | Admitting: Internal Medicine

## 2015-06-07 ENCOUNTER — Encounter: Payer: Self-pay | Admitting: Internal Medicine

## 2015-06-07 ENCOUNTER — Ambulatory Visit (INDEPENDENT_AMBULATORY_CARE_PROVIDER_SITE_OTHER): Payer: Medicaid - Out of State | Admitting: Internal Medicine

## 2015-06-07 VITALS — BP 102/82 | HR 74 | Ht 72.0 in | Wt 123.0 lb

## 2015-06-07 DIAGNOSIS — I5022 Chronic systolic (congestive) heart failure: Secondary | ICD-10-CM | POA: Diagnosis not present

## 2015-06-07 DIAGNOSIS — I472 Ventricular tachycardia, unspecified: Secondary | ICD-10-CM

## 2015-06-07 NOTE — Progress Notes (Signed)
Primary Cardiologist:  Dr Paralee Cancel is a 49 y.o. male who presents today for routine electrophysiology followup.  Since last being seen in our clinic, the patient reports doing reasonably well. He did have an episode of VF in April for which he received an appropriate ICD shock.  He was unaware.  He was switched from coreg to toprol by Dr Wyline Mood yesterday.  He has had some fatigue recently.   Today, he denies symptoms of palpitations, chest pain, shortness of breath,  lower extremity edema, dizziness, presyncope, or syncope.  The patient is otherwise without complaint today.   Past Medical History  Diagnosis Date  . Hypertension   . Tobacco user   . Gout   . Systolic heart failure     Secondary to nonischemic cardiomyopathy  . Polysubstance abuse     In remission since 2003  . Renal insufficiency   . Ventricular tachycardia 04/23/12    CL with appropriate ICD shock delivered   Past Surgical History  Procedure Laterality Date  . Thoracotomy  9/09    Left; for rib fracture and associated pneumothorax  . Cervical spine surgery    . Lumbar spine surgery    . Hernia repair      Bilateral  . Cardiac defibrillator placement      SJM ICD implanted by [facility] 08/15/10    Current Outpatient Prescriptions  Medication Sig Dispense Refill  . aspirin 81 MG EC tablet Take 81 mg by mouth daily.      . digoxin (DIGOX) 0.25 MG tablet Take 1 tablet (0.25 mg total) by mouth daily. 30 tablet 6  . furosemide (LASIX) 40 MG tablet Take 1 tablet (40 mg total) by mouth as needed. 30 tablet 3  . Melatonin 5 MG TABS Take 3 tablets by mouth daily.     . metoprolol succinate (TOPROL XL) 25 MG 24 hr tablet Take 1 tablet (25 mg total) by mouth 2 (two) times daily. 60 tablet 6  . multivitamin (THERAGRAN) per tablet Take 1 tablet by mouth daily.      Marland Kitchen spironolactone (ALDACTONE) 25 MG tablet Take 1 tablet (25 mg total) by mouth daily. 30 tablet 6   No current facility-administered medications  for this visit.    Physical Exam: Filed Vitals:   06/07/15 1113  BP: 102/82  Pulse: 74  Height: 6' (1.829 m)  Weight: 55.792 kg (123 lb)  SpO2: 99%    GEN- The patient is well appearing, alert and oriented x 3 today.   Head- normocephalic, atraumatic Eyes-  Sclera clear, conjunctiva pink Ears- hearing intact Oropharynx- clear Lungs- Clear to ausculation bilaterally, normal work of breathing Chest- ICD pocket is well healed Heart- Regular rate and rhythm, no murmurs, rubs or gallops, PMI not laterally displaced GI- soft, NT, ND, + BS Extremities- no clubbing, cyanosis, or edema  ICD interrogation- reviewed in detail today,  See PACEART report  Assessment and Plan:  1.  Chronic systolic dysfunction euvolemic today Stable on an appropriate medical regimen Normal ICD function See Pace Art report No medicine changes today I will enroll in [facility] clinic with Sherri Rad The importance of compliance with remote monitoring was stressed today  2. VT Stable No change required today Continue to titrate toprol as able upon follow-up with Dr Wyline Mood  3. HTN Stable No change required today  4. Tobacco Cessation advsied He is not ready to quit  Merlin Return to see me in 1 year I have stressed importance  of compliance Follow-up with Dr Purvis Sheffield as scheduled

## 2015-06-07 NOTE — Patient Instructions (Addendum)
Your physician recommends that you continue on your current medications as directed. Please refer to the Current Medication list given to you today. Merlin device check on 09/09/15. Your physician recommends that you schedule a follow-up appointment in: 1 year with Dr. [name]. You will receive a reminder letter in the mail in about 10 months reminding you to call and schedule your appointment. If you don't receive this letter, please contact our office. You are being enrolled in the [facility] clinic.

## 2015-06-11 LAB — CUP PACEART INCLINIC DEVICE CHECK
Battery Remaining Longevity: 66 mo
Brady Statistic RV Percent Paced: 0.01 %
Date Time Interrogation Session: 20160701152515
HighPow Impedance: 90 Ohm
Lead Channel Impedance Value: 687.5 Ohm
Lead Channel Pacing Threshold Amplitude: 0.5 V
Lead Channel Pacing Threshold Pulse Width: 0.5 ms
Lead Channel Sensing Intrinsic Amplitude: 11.8 mV
Lead Channel Setting Pacing Amplitude: 2.5 V
Lead Channel Setting Pacing Pulse Width: 0.5 ms
Lead Channel Setting Sensing Sensitivity: 0.5 mV
Pulse Gen Serial Number: 614505
Zone Setting Detection Interval: 270 ms
Zone Setting Detection Interval: 340 ms

## 2015-06-12 ENCOUNTER — Telehealth: Payer: Self-pay | Admitting: Cardiology

## 2015-06-12 NOTE — Telephone Encounter (Signed)
-----   Message from Hillis Range, MD sent at 06/07/2015  9:25 PM EDT ----- Regarding: ICM clinic Please enroll in ICM device clinic and schedule in 4 weeks.  Thanks!

## 2015-06-12 NOTE — Telephone Encounter (Signed)
Spoke w/ pt about ICM clinic. Pt agreed to be enrolled his first transmission is 07-11-15.

## 2015-06-24 ENCOUNTER — Telehealth: Payer: Self-pay

## 2015-06-24 NOTE — Telephone Encounter (Signed)
NO AUTH# RQD SPOKE WITH STEPHANIE [phone] [facility] PT WILL NOT AQUIRE OUT OF POCKET COST DAJ FOR CPT [phone]

## 2015-06-27 ENCOUNTER — Other Ambulatory Visit: Payer: Medicaid - Out of State

## 2015-07-11 ENCOUNTER — Ambulatory Visit (INDEPENDENT_AMBULATORY_CARE_PROVIDER_SITE_OTHER): Payer: Medicaid - Out of State | Admitting: *Deleted

## 2015-07-11 DIAGNOSIS — Z95810 Presence of automatic (implantable) cardiac defibrillator: Secondary | ICD-10-CM | POA: Diagnosis not present

## 2015-07-11 DIAGNOSIS — I5022 Chronic systolic (congestive) heart failure: Secondary | ICD-10-CM

## 2015-07-15 NOTE — Progress Notes (Signed)
EPIC Encounter for ICM Monitoring  Patient Name: Chad Christensen is a 49 y.o. male Date: 07/15/2015 Primary Care Physican: No primary care provider on file. Primary Cardiologist: Purvis Sheffield Electrophysiologist: Allred Dry Weight: 123 lbs       In the past month, have you:  1. Gained more than 2 pounds in a day or more than 5 pounds in a week? Uncertain, patient does not weigh regularly at home  2. Had changes in your medications (with verification of current medications)? no  3. Had more shortness of breath than is usual for you? no  4. Limited your activity because of shortness of breath? no  5. Not been able to sleep because of shortness of breath? no  6. Had increased swelling in your feet or ankles? Intermittent swelling and takes Lasix as needed  7. Had symptoms of dehydration (dizziness, dry mouth, increased thirst, decreased urine output) no  8. Had changes in sodium restriction? no  9. Been compliant with medication? Yes   ICM trend:   Follow-up plan: ICM clinic phone appointment 08/19/2015. This is first ICM encounter with patient. Corvue trends elevated from July 22-27.  Patient recalls some lower extremity swelling at this time. Took Lasix as needed and symptoms resolved. No changes made today.  Copy of note sent to patient's primary care physician, primary cardiologist, and device following physician.  Sherri Rad, RN, BSN 07/15/2015 4:01 PM

## 2015-07-18 ENCOUNTER — Other Ambulatory Visit: Payer: Medicaid - Out of State

## 2015-08-09 ENCOUNTER — Ambulatory Visit: Payer: Medicaid - Out of State | Admitting: Cardiovascular Disease

## 2015-08-19 ENCOUNTER — Ambulatory Visit (INDEPENDENT_AMBULATORY_CARE_PROVIDER_SITE_OTHER): Payer: Medicaid - Out of State

## 2015-08-19 DIAGNOSIS — Z95810 Presence of automatic (implantable) cardiac defibrillator: Secondary | ICD-10-CM | POA: Diagnosis not present

## 2015-08-19 DIAGNOSIS — I5022 Chronic systolic (congestive) heart failure: Secondary | ICD-10-CM

## 2015-08-19 NOTE — Progress Notes (Signed)
EPIC Encounter for ICM Monitoring  Patient Name: Chad Christensen is a 49 y.o. male Date: 08/19/2015 Primary Care Physican: No primary care provider on file. Primary Cardiologist: Purvis Sheffield Electrophysiologist: Allred Dry Weight: Unknown - no scale      In the past month, have you:  1. Gained more than 2 pounds in a day or more than 5 pounds in a week? no  2. Had changes in your medications (with verification of current medications)? no  3. Had more shortness of breath than is usual for you? no  4. Limited your activity because of shortness of breath? no  5. Not been able to sleep because of shortness of breath? no  6. Had increased swelling in your feet or ankles? no  7. Had symptoms of dehydration (dizziness, dry mouth, increased thirst, decreased urine output) no  8. Had changes in sodium restriction? no  9. Been compliant with medication? Yes   ICM trend:   Follow-up plan: ICM clinic phone appointment 08/30/2015 for repeat transmission.  CorVue transmission revealed impedance below baseline on 07/16/2015 to 07/19/2015, 07/24/2015 to 07/27/2015, 08/02/2015 to 08/10/2015 (significant below baseline) and above baseline 07/20/2015 to 07/24/2015, 07/28/2015 to 08/02/2015, 08/11/2015 to 08/13/2015, 08/14/2015 to 08/19/2015.  Patient stated in the last month he has  been excessively thirsty and cravings for sugar.  He is eating foods and drinks high in sugar and knows he is drinking too much fluids.  He stated in the last month he has had some shortness of  breath but no leg/ankle/feet swelling.  He does not have a scale at home.  He reported in the last month he has been taking Furosemide 40 mg daily instead of prn because he thought that would be better  for him since he has been drinking so many fluids.  Explained Furosemide should be taken as prescribed and to return to the prescribed dose of 40 mg as needed and Corvue is showing dehydration  at this time.  Education given fluid retention in this  last month may be related to drinking large volume of fluids and he should drink moderate amount of fluids to stay hydrated.  He was unable to follow  though with [facility] as requested by Dr Purvis Sheffield due to financial situation.  Education given that excessive thirst is a symptom of diabetes which he stated runs in his family.  He stated he is self paying  for his medical care at this time and it is difficult for him to see a physician.  He will go to the local pharmacy to have BS checked.  He does not have a scale.   Patient denied any symptoms today.   Reviewed dehydration and HF symptoms to report.          Plan for patient to take Furosemide 40mg  1 tablet as needed as prescribed, repeat transmission on 08/30/2015, report any HF or dehydration sx to office, and drink fluids in moderation.   Copy of note sent to patient's primary care physician, primary cardiologist, and device following physician.  Karie Soda, RN, CCM 08/19/2015 12:01 PM

## 2015-08-30 ENCOUNTER — Telehealth: Payer: Self-pay

## 2015-08-30 ENCOUNTER — Ambulatory Visit (INDEPENDENT_AMBULATORY_CARE_PROVIDER_SITE_OTHER): Payer: Medicare Other

## 2015-08-30 DIAGNOSIS — Z95810 Presence of automatic (implantable) cardiac defibrillator: Secondary | ICD-10-CM

## 2015-08-30 DIAGNOSIS — I5022 Chronic systolic (congestive) heart failure: Secondary | ICD-10-CM

## 2015-08-30 NOTE — Telephone Encounter (Signed)
Attempted call to patient regarding 2 week ICM transmission follow up and voice mail option has not been set up.

## 2015-09-02 NOTE — Progress Notes (Signed)
EPIC Encounter for ICM Monitoring  Patient Name: Chad Christensen is a 50 y.o. male Date: 09/02/2015 Primary Care Physican: No primary care provider on file. Primary Cardiologist: Purvis Sheffield Electrophysiologist: Allred Dry Weight: Does not weigh, no scales (last office weight 06/07/2015 = 123lbs)       In the past month, have you:  1. Gained more than 2 pounds in a day or more than 5 pounds in a week? no  2. Had changes in your medications (with verification of current medications)? no  3. Had more shortness of breath than is usual for you? no  4. Limited your activity because of shortness of breath? no  5. Not been able to sleep because of shortness of breath? no  6. Had increased swelling in your feet or ankles? no  7. Had symptoms of dehydration (dizziness, dry mouth, increased thirst, decreased urine output) no  8. Had changes in sodium restriction? no  9. Been compliant with medication? Yes   ICM trend:   Follow-up plan: ICM clinic phone appointment 09/16/2015.  Corvue impedance below baseline starting 08/20/2015 to time of transmission 08/30/2015.  Patient reported he feels fine.  Confirmed he was taking Lasix 40mg  as prescribed prn since 08/20/2015.  He reported the frequency of urination increases for about 2 hours after taking Lasix.  He denied any leg swelling or SOB.  He has not adjusted the amount of fluids he drinks per day as discussed at [facility] follow up on 08/20/2015 and continues to drink large volume of fluids daily.  Advised will discuss Corvue transmission with Dr [name] for recommendations.   Copy of note sent to patient's primary care physician, primary cardiologist, and device following physician.  Karie Soda, RN, CCM 09/02/2015 11:00 AM  Reviewed Corvue transmission with Dr [name].  Dr [name] advised to take Lasix 40mg  = 1 tablet x 3 days.  Following the 3 days, change Lasix 40mg   = 1 tablet every other day.  Advised to make an appointment with Dr Purvis Sheffield  within the next 2 months with BMET lab.          Patient notified to take Lasix 40mg  = 1 tablet x 3 days and change in prescribed Lasix to 40mg  = 1 tablet every other day.  Advised to make an appointment with Dr Purvis Sheffield within the next 2 months with lab.         Repeat transmission on 09/16/2015.

## 2015-09-02 NOTE — Telephone Encounter (Signed)
Spoke with patient.

## 2015-09-03 MED ORDER — FUROSEMIDE 40 MG PO TABS: 40.0000 mg | ORAL_TABLET | ORAL | Status: DC

## 2015-09-03 NOTE — Addendum Note (Signed)
Addended by: Karie Soda on: 09/03/2015 08:36 AM   Modules accepted: Orders

## 2015-09-16 ENCOUNTER — Ambulatory Visit (INDEPENDENT_AMBULATORY_CARE_PROVIDER_SITE_OTHER): Payer: Medicare Other | Admitting: *Deleted

## 2015-09-16 ENCOUNTER — Telehealth: Payer: Self-pay

## 2015-09-16 ENCOUNTER — Encounter: Payer: Self-pay | Admitting: Internal Medicine

## 2015-09-16 DIAGNOSIS — I472 Ventricular tachycardia, unspecified: Secondary | ICD-10-CM

## 2015-09-16 NOTE — Telephone Encounter (Signed)
ICM transmission received.  Attempted patient call and voice mail box has not been set up, no message left.

## 2015-09-17 NOTE — Telephone Encounter (Signed)
Patient left message he was returning call.  Attempted call back to patient and no answer.  Voice mail box has not been set up

## 2015-09-17 NOTE — Progress Notes (Signed)
Remote ICD transmission.   

## 2015-09-20 LAB — CUP PACEART REMOTE DEVICE CHECK
Battery Remaining Longevity: 65 mo
Battery Remaining Percentage: 56 %
Battery Voltage: 2.93 V
Brady Statistic RV Percent Paced: 1 %
Date Time Interrogation Session: 20161010101344
HighPow Impedance: 90 Ohm
HighPow Impedance: 90 Ohm
Implantable Lead Implant Date: 20110909
Implantable Lead Location: 753860
Implantable Lead Model: 7122
Lead Channel Impedance Value: 630 Ohm
Lead Channel Pacing Threshold Amplitude: 0.5 V
Lead Channel Pacing Threshold Pulse Width: 0.5 ms
Lead Channel Sensing Intrinsic Amplitude: 11.8 mV
Lead Channel Setting Pacing Amplitude: 2.5 V
Lead Channel Setting Pacing Pulse Width: 0.5 ms
Lead Channel Setting Sensing Sensitivity: 0.5 mV
Pulse Gen Serial Number: 614505
Zone Setting Detection Interval: 270 ms
Zone Setting Detection Interval: 340 ms
Zone Setting Vendor Type Category: 773185
Zone Setting Vendor Type Category: 773188

## 2015-09-20 NOTE — Telephone Encounter (Signed)
Unable to reach patient for last ICM transmission. Next transmission scheduled for 10/17/2015.

## 2015-10-17 ENCOUNTER — Ambulatory Visit (INDEPENDENT_AMBULATORY_CARE_PROVIDER_SITE_OTHER): Payer: Medicare Other

## 2015-10-17 DIAGNOSIS — Z95810 Presence of automatic (implantable) cardiac defibrillator: Secondary | ICD-10-CM

## 2015-10-17 DIAGNOSIS — I5022 Chronic systolic (congestive) heart failure: Secondary | ICD-10-CM | POA: Diagnosis not present

## 2015-10-17 NOTE — Progress Notes (Signed)
EPIC Encounter for ICM Monitoring  Patient Name: Chad Christensen is a 49 y.o. male Date: 10/17/2015 Primary Care Physican: No primary care provider on file. Primary Cardiologist: Purvis Sheffield Electrophysiologist: Allred Dry Weight: No scales       In the past month, have you:  1. Gained more than 2 pounds in a day or more than 5 pounds in a week? no  2. Had changes in your medications (with verification of current medications)? no  3. Had more shortness of breath than is usual for you? no  4. Limited your activity because of shortness of breath? no  5. Not been able to sleep because of shortness of breath? no  6. Had increased swelling in your feet or ankles? no  7. Had symptoms of dehydration (dizziness, dry mouth, increased thirst, decreased urine output) no  8. Had changes in sodium restriction? no  9. Been compliant with medication? Yes   ICM trend: 10/17/2015   Follow-up plan: ICM clinic phone appointment on 11/20/2015.   Corvue daily impedance below baseline 10/12/2015 to 10/16/2015 suggests fluid retention and he denied any symptoms.  Patient drinks a lot of fluids which may contribute impedance below baseline.   Have discussed at previous ICM calls to limit fluids to 64 oz a day.  10/04/2015 to 10/12/2015 impedance above the baseline suggesting dryness.  Encouraged him to balance his fluid intake on a daily basis.  He is taking Furosemide every other day.  No changes today.   Copy of note sent to patient's primary care physician, primary cardiologist, and device following physician.  Karie Soda, RN, CCM 10/17/2015 2:25 PM

## 2015-10-18 ENCOUNTER — Encounter: Payer: Self-pay | Admitting: Cardiology

## 2015-11-20 ENCOUNTER — Ambulatory Visit (INDEPENDENT_AMBULATORY_CARE_PROVIDER_SITE_OTHER): Payer: Medicare Other

## 2015-11-20 DIAGNOSIS — I5022 Chronic systolic (congestive) heart failure: Secondary | ICD-10-CM

## 2015-11-20 DIAGNOSIS — Z95810 Presence of automatic (implantable) cardiac defibrillator: Secondary | ICD-10-CM

## 2015-11-21 NOTE — Progress Notes (Signed)
EPIC Encounter for ICM Monitoring  Patient Name: Chad Christensen is a 49 y.o. male Date: 11/21/2015 Primary Care Physican: No primary care provider on file. Primary Cardiologist: Purvis Sheffield Electrophysiologist: Allred Dry Weight: Does not weigh       In the past month, have you:  1. Gained more than 2 pounds in a day or more than 5 pounds in a week? Unknown  2. Had changes in your medications (with verification of current medications)? no  3. Had more shortness of breath than is usual for you? no  4. Limited your activity because of shortness of breath? no  5. Not been able to sleep because of shortness of breath? no  6. Had increased swelling in your feet or ankles? no  7. Had symptoms of dehydration (dizziness, dry mouth, increased thirst, decreased urine output) no  8. Had changes in sodium restriction? Not compliant with low salt diet  9. Been compliant with medication? No, does not take Furosemide every other day as prescribed.  If he has things to do such as errands he does not always take the Furosemide.    ICM trend:  11/20/2015     ICM trend: 1 year view  Follow-up plan: ICM clinic phone appointment on 12/24/2015.  Corvue daily impedance below baseline 11/12 to 11/14, 11/25 to 12/2 and 12/9 to 12/12 suggesting fluid retention.  He reported some hand swelling during that time. He reported his good friend died a couple of weeks ago and during that time he missed several days of Furosemide.  He reported he is not compliant with low salt diet and his body craves salt.  Education given on how diet high in salt can worsen heart failure.  He stated he understands the consequences of his choice in eating salt.  He reported he is back on track with taking Furosemide every other day and will try to cut down on salt.  Explained he can replace the salt with herbs which do not have sodium.  No changes today.   Copy of note sent to patient's primary care physician, primary  cardiologist, and device following physician.  Karie Soda, RN, CCM 11/21/2015 1:58 PM

## 2015-12-10 ENCOUNTER — Telehealth: Payer: Self-pay

## 2015-12-10 NOTE — Telephone Encounter (Signed)
Call to patient.  He reported he would like more information regarding the letter.  Explained the recall letter and advised he is monitored remotely and will feel a vibration if the battery is low.  He has made an appointment with Dr [name] in February to discuss his device.    Received voice mail message regarding the [facility] recall letter he received and requested a call back.

## 2015-12-11 ENCOUNTER — Other Ambulatory Visit: Payer: Self-pay | Admitting: Internal Medicine

## 2015-12-24 ENCOUNTER — Ambulatory Visit (INDEPENDENT_AMBULATORY_CARE_PROVIDER_SITE_OTHER): Payer: Medicare Other

## 2015-12-24 ENCOUNTER — Ambulatory Visit (INDEPENDENT_AMBULATORY_CARE_PROVIDER_SITE_OTHER): Payer: Medicare Other | Admitting: *Deleted

## 2015-12-24 DIAGNOSIS — I472 Ventricular tachycardia, unspecified: Secondary | ICD-10-CM

## 2015-12-24 DIAGNOSIS — Z95810 Presence of automatic (implantable) cardiac defibrillator: Secondary | ICD-10-CM

## 2015-12-24 DIAGNOSIS — I5022 Chronic systolic (congestive) heart failure: Secondary | ICD-10-CM

## 2015-12-24 NOTE — Progress Notes (Signed)
Remote ICD transmission.   

## 2015-12-25 ENCOUNTER — Telehealth: Payer: Self-pay

## 2015-12-25 NOTE — Telephone Encounter (Signed)
Attempted call back to patient and no voice mail has been set up yet.    Received voice mail message requesting a return call regarding transmission and [employer] recall letter

## 2015-12-25 NOTE — Progress Notes (Signed)
EPIC Encounter for ICM Monitoring  Patient Name: Chad Christensen is a 50 y.o. male Date: 12/25/2015 Primary Care Physican: No primary care provider on file. Primary Cardiologist: Purvis Sheffield Electrophysiologist: Allred Dry Weight: Does not weigh       In the past month, have you:  1. Gained more than 2 pounds in a day or more than 5 pounds in a week? Unknown  2. Had changes in your medications (with verification of current medications)? no  3. Had more shortness of breath than is usual for you? no  4. Limited your activity because of shortness of breath? no  5. Not been able to sleep because of shortness of breath? no  6. Had increased swelling in your feet or ankles? no  7. Had symptoms of dehydration (dizziness, dry mouth, increased thirst, decreased urine output) no  8. Had changes in sodium restriction? no  9. Been compliant with medication? Yes   ICM trend: 3 month view 12/23/2015  ICM trend: 1 year view 12/23/2015  Follow-up plan: ICM clinic phone appointment on 02/24/2016 and office appointment with Dr [name] on 01/24/2016.  Corvue daily thoracic impedance with numerous above and below reference line.  Patient denied any HF symptoms when impedance was below reference line.  He stated he knows he does not take care of himself like he should.  He reported his body always craves salt and he drinks plenty of fluids.  He reported he is worried about the [facility] Jude recall on his device and will be discussing with Dr [name] if he can have his device replaced.  He said it worries him to have the current device that the battery life may deplete faster than expected.  Explained Dr [name] will discuss the recall with him.  He reported he saw on the news that [employer] Jude devices can be hacked and asked what can be done and does he need to worry about that happening.  I asked if he would like for [employer] Jude rep to call him regarding the hacking information and he agreed to a call.  Contacted [employer] Jude rep  and requested he reach out to patient regarding the hacking information.  No changes today.    Copy of note sent to patient's primary care physician, primary cardiologist, and device following physician.  Karie Soda, RN, CCM 12/25/2015 11:34 AM

## 2015-12-25 NOTE — Telephone Encounter (Signed)
Spoke with patient.

## 2015-12-31 LAB — CUP PACEART REMOTE DEVICE CHECK
Battery Remaining Longevity: 60 mo
Brady Statistic RV Percent Paced: 1 % — CL
Date Time Interrogation Session: 20170124085335
HighPow Impedance: 96 Ohm
Implantable Lead Implant Date: 20110909
Implantable Lead Location: 753860
Implantable Lead Model: 7122
Lead Channel Impedance Value: 740 Ohm
Lead Channel Sensing Intrinsic Amplitude: 11.8 mV
Lead Channel Setting Pacing Amplitude: 2.5 V
Lead Channel Setting Pacing Pulse Width: 0.5 ms
Lead Channel Setting Sensing Sensitivity: 0.5 mV
Pulse Gen Serial Number: 614505

## 2016-01-03 ENCOUNTER — Encounter: Payer: Self-pay | Admitting: Cardiology

## 2016-01-08 ENCOUNTER — Other Ambulatory Visit: Payer: Self-pay | Admitting: Cardiovascular Disease

## 2016-01-11 ENCOUNTER — Telehealth: Payer: Self-pay | Admitting: Physician Assistant

## 2016-01-11 ENCOUNTER — Other Ambulatory Visit: Payer: Self-pay | Admitting: Cardiovascular Disease

## 2016-01-11 MED ORDER — SPIRONOLACTONE 25 MG PO TABS: 25.0000 mg | ORAL_TABLET | Freq: Every day | ORAL | Status: DC

## 2016-01-11 NOTE — Telephone Encounter (Signed)
Given rx of spironolactone.   [place], [place]

## 2016-01-24 ENCOUNTER — Encounter: Payer: Self-pay | Admitting: Internal Medicine

## 2016-01-24 ENCOUNTER — Ambulatory Visit (INDEPENDENT_AMBULATORY_CARE_PROVIDER_SITE_OTHER): Payer: Medicare Other | Admitting: Internal Medicine

## 2016-01-24 VITALS — BP 118/84 | HR 94 | Ht 72.0 in | Wt 127.8 lb

## 2016-01-24 DIAGNOSIS — I472 Ventricular tachycardia, unspecified: Secondary | ICD-10-CM

## 2016-01-24 DIAGNOSIS — I5022 Chronic systolic (congestive) heart failure: Secondary | ICD-10-CM | POA: Diagnosis not present

## 2016-01-24 LAB — CUP PACEART INCLINIC DEVICE CHECK
Battery Remaining Longevity: 60 mo
Brady Statistic RV Percent Paced: 0.08 %
Date Time Interrogation Session: 20170217171254
HighPow Impedance: 78.75 Ohm
Implantable Lead Implant Date: 20110909
Implantable Lead Location: 753860
Implantable Lead Model: 7122
Lead Channel Impedance Value: 575 Ohm
Lead Channel Pacing Threshold Amplitude: 0.5 V
Lead Channel Pacing Threshold Amplitude: 0.5 V
Lead Channel Pacing Threshold Pulse Width: 0.5 ms
Lead Channel Pacing Threshold Pulse Width: 0.5 ms
Lead Channel Sensing Intrinsic Amplitude: 11.8 mV
Lead Channel Setting Pacing Amplitude: 2.5 V
Lead Channel Setting Pacing Pulse Width: 0.5 ms
Lead Channel Setting Sensing Sensitivity: 0.5 mV
Pulse Gen Serial Number: 614505

## 2016-01-24 MED ORDER — DIGOXIN 250 MCG PO TABS: 0.2500 mg | ORAL_TABLET | Freq: Every day | ORAL | Status: DC

## 2016-01-24 MED ORDER — METOPROLOL SUCCINATE ER 25 MG PO TB24: 25.0000 mg | ORAL_TABLET | Freq: Two times a day (BID) | ORAL | Status: DC

## 2016-01-24 MED ORDER — SPIRONOLACTONE 25 MG PO TABS: 25.0000 mg | ORAL_TABLET | Freq: Every day | ORAL | Status: DC

## 2016-01-24 MED ORDER — FUROSEMIDE 40 MG PO TABS: 40.0000 mg | ORAL_TABLET | ORAL | Status: DC

## 2016-01-24 NOTE — Patient Instructions (Signed)
Medication Instructions:   Your physician recommends that you continue on your current medications as directed. Please refer to the Current Medication list given to you today.   Labwork: None ordered   Testing/Procedures: None ordered   Follow-Up: Remote monitoring is used to monitor your Pacemaker  from home. This monitoring reduces the number of office visits required to check your device to one time per year. It allows [place] to keep an eye on the functioning of your device to ensure it is working properly. You are scheduled for a device check from home on 04/27/16. You may send your transmission at any time that day. If you have a wireless device, the transmission will be sent automatically. After your physician reviews your transmission, you will receive a postcard with your next transmission date.  Your physician wants you to follow-up in: 12 months with Dr [name] in [place] You will receive a reminder letter in the mail two months in advance. If you don't receive a letter, please call our office to schedule the follow-up appointment.     Any Other Special Instructions Will Be Listed Below (If Applicable).     If you need a refill on your cardiac medications before your next appointment, please call your pharmacy.

## 2016-01-24 NOTE — Progress Notes (Signed)
Primary Cardiologist:  Dr Purvis Sheffield (says that he is going to establish with general cardiology in [facility])  Chad Christensen is a 50 y.o. male who presents today for routine electrophysiology followup.  Since last being seen in our clinic, the patient reports doing reasonably well.  Remains active.  SOB and stable.  Continues to smoke.  Today, he denies symptoms of palpitations, exertional chest pain,  lower extremity edema, dizziness, presyncope, or syncope.  The patient is otherwise without complaint today.   Past Medical History  Diagnosis Date  . Hypertension   . Tobacco user   . Gout   . Systolic heart failure     Secondary to nonischemic cardiomyopathy  . Polysubstance abuse     In remission since 2003  . Renal insufficiency   . Ventricular tachycardia (HCC) 04/23/12    CL with appropriate ICD shock delivered   Past Surgical History  Procedure Laterality Date  . Thoracotomy  9/09    Left; for rib fracture and associated pneumothorax  . Cervical spine surgery    . Lumbar spine surgery    . Hernia repair      Bilateral  . Cardiac defibrillator placement      SJM ICD implanted by [facility] 08/15/10    Current Outpatient Prescriptions  Medication Sig Dispense Refill  . aspirin 81 MG EC tablet Take 81 mg by mouth daily.      . digoxin (DIGOX) 0.25 MG tablet Take 1 tablet (0.25 mg total) by mouth daily. 15 tablet 0  . furosemide (LASIX) 40 MG tablet Take 1 tablet (40 mg total) by mouth every other day. 30 tablet 3  . Melatonin 5 MG TABS Take 3 tablets by mouth daily.     . metoprolol succinate (TOPROL XL) 25 MG 24 hr tablet Take 1 tablet (25 mg total) by mouth 2 (two) times daily. 60 tablet 6  . multivitamin (THERAGRAN) per tablet Take 1 tablet by mouth daily.      Marland Kitchen spironolactone (ALDACTONE) 25 MG tablet Take 1 tablet (25 mg total) by mouth daily. 30 tablet 0   No current facility-administered medications for this visit.    Physical Exam: Filed Vitals:   01/24/16 1402   BP: 118/84  Pulse: 94  Height: 6' (1.829 m)  Weight: 127 lb 12.8 oz (57.97 kg)    GEN- The patient is well appearing, alert and oriented x 3 today.   Head- normocephalic, atraumatic Eyes-  Sclera clear, conjunctiva pink Ears- hearing intact Oropharynx- clear Lungs- Clear to ausculation bilaterally, normal work of breathing Chest- ICD pocket is well healed Heart- Regular rate and rhythm, no murmurs, rubs or gallops, PMI not laterally displaced GI- soft, NT, ND, + BS Extremities- no clubbing, cyanosis, or edema  ICD interrogation- reviewed in detail today,  See PACEART report  Assessment and Plan:  1.  Chronic systolic dysfunction euvolemic today Stable on an appropriate medical regimen Normal ICD function See Pace Art report Followed in ICM device clinic Does not wish to see Dr Purvis Sheffield in the future.  Says he will establish with general cardiology in [place].   Importance of close general cardiology follow-up discussed with the patient today  2. VT/VF Stable No change required today I have discussed SJM Fortify Assura advisary with the patient today. He understands that recommendation from SJM is to not replace the device at this time. The patient is not device dependant.  The patient has had appropriate device therapy in the past or implanted for secondary  prevention.  Vibratory alert demonstrated today.  He is actively remotely monitored and understands the importance of compliance today.  He and I have discussed at length and agree that conservative management with close remote monitoring is advised rather than device replacement at this time.  3. HTN Stable No change required today  4. Tobacco Cessation advsied He is not ready to quit  Merlin Return to see me in 1 year in [place] I have stressed importance of compliance He is instructed to establish with general cardiology in [place] MD, [facility] 01/24/2016 2:34 PM

## 2016-02-01 ENCOUNTER — Encounter: Payer: Self-pay | Admitting: Internal Medicine

## 2016-02-03 ENCOUNTER — Telehealth: Payer: Self-pay | Admitting: *Deleted

## 2016-02-03 NOTE — Telephone Encounter (Signed)
Alert printed for episode with an aborted ICD shock on 01/31/2016. Patient states that he believes he was walking during the time of the episode when all of a sudden he got dizzy and felt as though he was going to pass out. Patient states that he does take all of his medications as Rx'd, but states that he normally takes his Toprol XL just once a day instead of BID, but he bases this off of his BP.  Will inform Dr.Allred about episode and notify patient if anything further is recommended. Patient voiced understanding.

## 2016-02-24 ENCOUNTER — Ambulatory Visit (INDEPENDENT_AMBULATORY_CARE_PROVIDER_SITE_OTHER): Payer: Medicare Other

## 2016-02-24 DIAGNOSIS — Z95810 Presence of automatic (implantable) cardiac defibrillator: Secondary | ICD-10-CM

## 2016-02-24 DIAGNOSIS — I5022 Chronic systolic (congestive) heart failure: Secondary | ICD-10-CM | POA: Diagnosis not present

## 2016-02-24 NOTE — Progress Notes (Signed)
EPIC Encounter for ICM Monitoring  Patient Name: Chad Christensen is a 50 y.o. male Date: 02/24/2016 Primary Care Physican: No PCP Per Patient Primary Cardiologist: Purvis Sheffield Electrophysiologist: Allred Dry Weight: uknown  In the past month, have you:  1. Gained more than 2 pounds in a day or more than 5 pounds in a week? unknown  2. Had changes in your medications (with verification of current medications)? no  3. Had more shortness of breath than is usual for you? no  4. Limited your activity because of shortness of breath? no  5. Not been able to sleep because of shortness of breath? no  6. Had increased swelling in your feet or ankles? no  7. Had symptoms of dehydration (dizziness, dry mouth, increased thirst, decreased urine output) no  8. Had changes in sodium restriction? Does not follow a low salt.    9. Been compliant with medication? Yes   ICM trend: 3 month view for 02/24/2016   ICM trend: 1 year view for 02/24/2016   Follow-up plan: ICM clinic phone appointment on 03/26/2016.  Thoracic impedance below reference line 02/02/2016 to 02/09/2016 and 02/17/2016 to 02/20/2016 reference line from suggesting fluid accumulation.  Patient stated he has some swelling in feet during that period but does not have any symptoms any today.  Education given to limit sodium intake to < 2000 mg and fluid intake to 64 oz daily.  Encouraged to call for any fluid symptoms.  No changes today.    Copy of note sent to patient's primary care physician, primary cardiologist, and device following physician.  Karie Soda, RN, CCM 02/24/2016 3:11 PM

## 2016-03-26 ENCOUNTER — Ambulatory Visit (INDEPENDENT_AMBULATORY_CARE_PROVIDER_SITE_OTHER): Payer: Medicare Other

## 2016-03-26 DIAGNOSIS — Z95810 Presence of automatic (implantable) cardiac defibrillator: Secondary | ICD-10-CM

## 2016-03-26 DIAGNOSIS — I5022 Chronic systolic (congestive) heart failure: Secondary | ICD-10-CM

## 2016-03-26 NOTE — Progress Notes (Signed)
EPIC Encounter for ICM Monitoring  Patient Name: Chad Christensen is a 50 y.o. male Date: 03/26/2016 Primary Care Physican: No PCP Per Patient Primary Cardiologist: Purvis Sheffield Electrophysiologist: Allred Dry Weight: Does not weigh   In the past month, have you:  1. Gained more than 2 pounds in a day or more than 5 pounds in a week? no  2. Had changes in your medications (with verification of current medications)? no  3. Had more shortness of breath than is usual for you? no  4. Limited your activity because of shortness of breath? no  5. Not been able to sleep because of shortness of breath? no  6. Had increased swelling in your feet or ankles? no  7. Had symptoms of dehydration (dizziness, dry mouth, increased thirst, decreased urine output) no  8. Had changes in sodium restriction? no  9. Been compliant with medication? Yes   ICM trend: 3 month view for 03/26/2016   ICM trend: 1 year view for 03/26/2016   Follow-up plan: ICM clinic phone appointment on 04/27/2016.  Thoracic impedance below reference line from 03/10/2016 to 03/14/2016 and 03/17/2016 to 03/19/2016 suggesting fluid accumulation and returned to reference line 03/20/2016 suggesting fluid levels stabilized.  Patient denied fluid symptoms.  Reminded to limit sodium intake to < 2000 mg and fluid intake to 64 oz daily.  Encouraged to call for any fluid symptoms.  No changes today.      Karie Soda, RN, CCM 03/26/2016 1:37 PM

## 2016-04-21 ENCOUNTER — Telehealth: Payer: Self-pay | Admitting: Internal Medicine

## 2016-04-21 ENCOUNTER — Ambulatory Visit (INDEPENDENT_AMBULATORY_CARE_PROVIDER_SITE_OTHER): Payer: Medicare Other

## 2016-04-21 DIAGNOSIS — I5022 Chronic systolic (congestive) heart failure: Secondary | ICD-10-CM

## 2016-04-21 DIAGNOSIS — Z95810 Presence of automatic (implantable) cardiac defibrillator: Secondary | ICD-10-CM

## 2016-04-21 NOTE — Telephone Encounter (Signed)
Pt states he would like to talk with Lawson Fiscal short at the Device clinic.
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
## 2016-04-21 NOTE — Progress Notes (Signed)
EPIC Encounter for ICM Monitoring  Patient Name: Chad Christensen is a 50 y.o. male Date: 04/21/2016 Primary Care Physican: No PCP Per Patient Primary Cardiologist: None Electrophysiologist: Allred Dry Weight: unknown      In the past month, have you:  1. Gained more than 2 pounds in a day or more than 5 pounds in a week? no  2. Had changes in your medications (with verification of current medications)? no  3. Had more shortness of breath than is usual for you? no  4. Limited your activity because of shortness of breath? no  5. Not been able to sleep because of shortness of breath? no  6. Had increased swelling in your feet, ankles, legs or stomach area? no  7. Had symptoms of dehydration (dizziness, dry mouth, increased thirst, decreased urine output) no  8. Had changes in sodium restriction? no  9. Been compliant with medication? Yes  ICM trend: 3 month view for 04/21/2016   ICM trend: 1 year view for 04/21/2016   Follow-up plan: ICM clinic phone appointment 04/27/2016.  Received call from patient.    FLUID LEVELS:  Corvue thoracic impedance decreased 04/02/2016 to 04/05/2016, 04/10/2016 to 04/19/2016 suggesting fluid accumulation.    SYMPTOMS:   No fluid symptoms.  Patient reported passing out on 04/16/2016 - 04/17/2016 but has no memory of the episode but had assistance when it happened.  He stated since that episode he has not been able to think clearly and a little confused at times.  He reported history of strokes in his family.   Advised he should be seen at the ER and offered to call 911 for him.  He stated he has not problem with being able to call EMS if he cannot find a ride.     Advised will send to Dr [name] for review.  If any recommendations, will call back.    Karie Soda, RN, CCM 04/21/2016 11:29 AM

## 2016-04-21 NOTE — Telephone Encounter (Addendum)
Received call back from patient.  He reported passing out either Thursday or Friday last week when he was outside.  He was assisted by a friend to the house but he does not remember anything about that day.  He reported he still has some difficulty being able to think clearly and is a little confused at times.  He is oriented to place and person.  Remote transmission received and reviewed by Acie Fredrickson, Device RN and no alerts, no episodes, normal device function, symptoms appear to be unrelated.  Advised he had some fluid accumulation last week.  Advised since he continues to have symptoms of confusion and not thinking clearly, he needs to be seen in the ER.  Offered to call 911 and he stated he will check if someone can take him and if not, he will call 911. He reported his family has history of strokes.

## 2016-04-21 NOTE — Telephone Encounter (Signed)
Attempted return call to patient.   

## 2016-04-23 ENCOUNTER — Telehealth: Payer: Self-pay

## 2016-04-23 NOTE — Telephone Encounter (Signed)
Received call from patient stating he was discharged from the hospital today.  He called 911 on 04/21/2016 as advised when we spoke regarding his symptoms.  He stated he was grateful for the advice and was dx with a stroke.  He was started on Eliquis.  He stated he has plaque in the brain which is the reason he is getting less oxygen to the brain and causing memory issues.  He stated he will call back due to he had company at this time.

## 2016-04-27 ENCOUNTER — Ambulatory Visit (INDEPENDENT_AMBULATORY_CARE_PROVIDER_SITE_OTHER): Payer: Medicare Other | Admitting: *Deleted

## 2016-04-27 DIAGNOSIS — I472 Ventricular tachycardia, unspecified: Secondary | ICD-10-CM

## 2016-04-27 DIAGNOSIS — I5022 Chronic systolic (congestive) heart failure: Secondary | ICD-10-CM | POA: Diagnosis not present

## 2016-04-27 DIAGNOSIS — Z95810 Presence of automatic (implantable) cardiac defibrillator: Secondary | ICD-10-CM

## 2016-04-27 NOTE — Progress Notes (Signed)
Remote ICD transmission.   

## 2016-04-28 NOTE — Progress Notes (Signed)
EPIC Encounter for ICM Monitoring  Patient Name: Chad Christensen is a 50 y.o. male Date: 04/28/2016 Primary Care Physican: No PCP Per Patient Primary Cardiologist: None Electrophysiologist: Allred Dry Weight: unknown      In the past month, have you:  1. Gained more than 2 pounds in a day or more than 5 pounds in a week? no  2. Had changes in your medications (with verification of current medications)? Yes, started on Eliquis and cholesterol med but did not have the amounts or name of cholesterol med.  He will provide it at next call.   3. Had more shortness of breath than is usual for you? no  4. Limited your activity because of shortness of breath? no  5. Not been able to sleep because of shortness of breath? no  6. Had increased swelling in your feet, ankles, legs or stomach area? no  7. Had symptoms of dehydration (dizziness, dry mouth, increased thirst, decreased urine output) no  8. Had changes in sodium restriction? no  9. Been compliant with medication? Yes  ICM trend: 3 month view for 04/27/2016  ICM trend: 1 year view for 04/27/2016  Follow-up plan: ICM clinic phone appointment 05/29/2016.    FLUID LEVELS:   Corvue thoracic impedance decreased 04/10/2016 to 04/19/2016 suggesting fluid accumulation and returned to baseline 04/19/2016.  Thoracic impedance above baseline 04/19/2016 to 04/27/2016 suggesting dryness.   dryness.    SYMPTOMS:   None.  Denied any symptoms such as weight gain of 3 pounds overnight or 5 pounds within a week, SOB and/or lower extremity swelling.  Encouraged to call for any fluid symptoms.  Patient had a ischemic stroke which effected left side of body.  He stated he is using a walker and still has some weakness but is improving.      EDUCATION: Limit sodium intake to < 2000 mg and fluid intake to 64 oz daily.     RECOMMENDATIONS: No changes today.     Karie Soda, RN, CCM 04/28/2016 12:47 PM

## 2016-05-18 LAB — CUP PACEART REMOTE DEVICE CHECK
Battery Remaining Longevity: 59 mo
Battery Remaining Percentage: 50 %
Battery Voltage: 2.93 V
Brady Statistic RV Percent Paced: 1 %
Date Time Interrogation Session: 20170522060016
HighPow Impedance: 100 Ohm
HighPow Impedance: 100 Ohm
Implantable Lead Implant Date: 20110909
Implantable Lead Location: 753860
Implantable Lead Model: 7122
Lead Channel Impedance Value: 710 Ohm
Lead Channel Sensing Intrinsic Amplitude: 11.8 mV
Lead Channel Setting Pacing Amplitude: 2.5 V
Lead Channel Setting Pacing Pulse Width: 0.5 ms
Lead Channel Setting Sensing Sensitivity: 0.5 mV
Pulse Gen Serial Number: 614505

## 2016-05-21 ENCOUNTER — Encounter: Payer: Self-pay | Admitting: Cardiology

## 2016-05-29 ENCOUNTER — Ambulatory Visit (INDEPENDENT_AMBULATORY_CARE_PROVIDER_SITE_OTHER): Payer: Medicare Other

## 2016-05-29 DIAGNOSIS — I5022 Chronic systolic (congestive) heart failure: Secondary | ICD-10-CM | POA: Diagnosis not present

## 2016-05-29 DIAGNOSIS — Z95810 Presence of automatic (implantable) cardiac defibrillator: Secondary | ICD-10-CM

## 2016-06-01 NOTE — Progress Notes (Signed)
EPIC Encounter for ICM Monitoring  Patient Name: Chad Christensen is a 50 y.o. male Date: 06/01/2016 Primary Care Physican: No PCP Per Patient Primary Cardiologist: None Electrophysiologist: Allred Dry Weight: unknown      In the past month, have you:  1. Gained more than 2 pounds in a day or more than 5 pounds in a week? No  2. Had changes in your medications (with verification of current medications)? No  3. Had more shortness of breath than is usual for you? No   4. Limited your activity because of shortness of breath? No   5. Not been able to sleep because of shortness of breath? No   6. Had increased swelling in your feet, ankles, legs or stomach area? Yes, occasional ankle swelling.  7. Had symptoms of dehydration (dizziness, dry mouth, increased thirst, decreased urine output) No   8. Had changes in sodium restriction? No   9. Been compliant with medication? No, he has been taking Furosemide prn instead of every other day because he is still having trouble with balance and walking due to a stroke.  Advised he should take Furosemide as ordered, every other day, to better manage fluid symptoms.   He also has been taking Eliquis once a day instead of twice a day due to cost.  Advised to call physician today, that is managing his stroke, to inform he has not been taking Eliquis twice a day.    ICM trend: 3 month view for 05/29/2016   ICM trend: 1 year view for 05/29/2016   Follow-up plan: ICM clinic phone appointment 07/02/2016.    FLUID LEVELS: Corvue thoracic impedance decreased 05/02/2016 to 05/08/2016, 05/10/2016 to 05/14/2016, 05/21/2016 to 05/26/2016 suggesting fluid accumulation and returned to baseline 05/26/2016 suggesting fluid levels are stabilizing.    SYMPTOMS:  Reported ankle swelling occasionally which resolves when he takes his Lasix.  He is still recovering from stroke and receiving daily PT.  Using a cane for support.  He reported he is still having trouble with memory  since the stroke.   EDUCATION: Limit sodium intake to < 2000 mg and fluid intake to 64 oz daily.     RECOMMENDATIONS: No changes today.  Recommended he call the PCP today to discuss that he is unable to take Eliquis twice a day because there is an increase in risk for another stroke if he is not taking as prescribed.  He stated he cannot afford the medication.      Karie Soda, RN, CCM 06/01/2016 8:22 AM

## 2016-07-02 ENCOUNTER — Ambulatory Visit (INDEPENDENT_AMBULATORY_CARE_PROVIDER_SITE_OTHER): Payer: Medicare Other

## 2016-07-02 DIAGNOSIS — I5022 Chronic systolic (congestive) heart failure: Secondary | ICD-10-CM | POA: Diagnosis not present

## 2016-07-02 DIAGNOSIS — Z95810 Presence of automatic (implantable) cardiac defibrillator: Secondary | ICD-10-CM | POA: Diagnosis not present

## 2016-07-03 ENCOUNTER — Telehealth: Payer: Self-pay

## 2016-07-03 NOTE — Progress Notes (Signed)
EPIC Encounter for ICM Monitoring  Patient Name: Chad Christensen is a 50 y.o. male Date: 07/03/2016 Primary Care Physican: No PCP Per Patient Primary Cardiologist: Allred Electrophysiologist: Allred Dry Weight:  unknown      Attempted patient call and unable to reach.  Transmission reviewed.   Thoracic impedance decreased suggesting fluid accumulation 06/23/2016 to 06/25/2016 and returned to baseline 06/25/2016.   ICM trend: 07/02/2016    Follow-up plan: ICM clinic phone appointment on 08/03/2016.  Copy of ICM check sent to device physician.   Karie Soda, RN 07/03/2016 1:45 PM

## 2016-07-03 NOTE — Progress Notes (Signed)
Patient return call.  Transmission reviewed.  He denied any fluid symptoms.  Next transmission scheduled for 8/38/2017.

## 2016-07-03 NOTE — Telephone Encounter (Signed)
Remote ICM transmission received.  Attempted patient call and left message for return call.   

## 2016-08-03 ENCOUNTER — Telehealth: Payer: Self-pay

## 2016-08-03 ENCOUNTER — Ambulatory Visit (INDEPENDENT_AMBULATORY_CARE_PROVIDER_SITE_OTHER): Payer: Medicare Other | Admitting: *Deleted

## 2016-08-03 DIAGNOSIS — I5022 Chronic systolic (congestive) heart failure: Secondary | ICD-10-CM | POA: Diagnosis not present

## 2016-08-03 DIAGNOSIS — Z95810 Presence of automatic (implantable) cardiac defibrillator: Secondary | ICD-10-CM | POA: Diagnosis not present

## 2016-08-03 NOTE — Telephone Encounter (Signed)
Call to Dr Mariella Saa PCP office and spoke with Cordelia Pen.  Advised patient sees Sherren Mocha, NP at the practice and requesting copy of latest lab results.  She stated he had labs drawn on 06/2016 and would fax copy.  Fax number provided.

## 2016-08-03 NOTE — Progress Notes (Signed)
EPIC Encounter for ICM Monitoring  Patient Name: Chad Christensen is a 50 y.o. male Date: 08/03/2016 Primary Care Physican: No PCP Per Patient Primary Cardiologist: Allred Electrophysiologist: Allred Dry Weight: unknown        Heart Failure questions reviewed, pt asymptomatic.  Patient has not taken Furosemide for last 3 days due to he was traveling.  Updated medication list.   Thoracic impedance abnormal suggesting fluid accumulation since 07/26/2016.  Patient has established with Sherren Mocha, NP/Dr Michel Santee at [facility] [phone].  Call to office, spoke with Cordelia Pen, and requested faxed copy of latest labs.  She stated BMET was completed 06/2016 and would fax copy.               LABS:     06/22/2016 Creatinine 0.77, BUN 7, Potassium 5.1, Sodium 144  Recommendations:  Increase Furosemide 40 mg to bid x 3 days and return to Furosemide 40 mg every other day after 3rd day.    Follow-up plan: ICM clinic phone appointment on 08/07/2016.  Copy of ICM check sent to device physician.   ICM trend: 08/03/2016       Karie Soda, RN 08/03/2016 9:58 AM

## 2016-08-03 NOTE — Telephone Encounter (Signed)
Received faxed copy of lab results from 06/12/2016.   Creatinine 0.77, BUN 7, Potassium 5.1, Sodium 144

## 2016-08-03 NOTE — Telephone Encounter (Signed)
Remote ICM transmission received.  Attempted patient call and unable to leave message due to voice mail is not set up.

## 2016-08-07 ENCOUNTER — Telehealth: Payer: Self-pay

## 2016-08-07 ENCOUNTER — Telehealth: Payer: Self-pay | Admitting: Internal Medicine

## 2016-08-07 ENCOUNTER — Ambulatory Visit (INDEPENDENT_AMBULATORY_CARE_PROVIDER_SITE_OTHER): Payer: Medicare Other

## 2016-08-07 DIAGNOSIS — Z95810 Presence of automatic (implantable) cardiac defibrillator: Secondary | ICD-10-CM

## 2016-08-07 DIAGNOSIS — I5022 Chronic systolic (congestive) heart failure: Secondary | ICD-10-CM

## 2016-08-07 NOTE — Telephone Encounter (Signed)
Attempted call back to patient and no voice mail option.

## 2016-08-07 NOTE — Telephone Encounter (Signed)
Spoke with patient.

## 2016-08-07 NOTE — Progress Notes (Signed)
Briefly spoke with patient.  Advised since taking extra medication, fluid levels returned to normal.

## 2016-08-07 NOTE — Telephone Encounter (Signed)
error 

## 2016-08-07 NOTE — Telephone Encounter (Signed)
Patient returned call and left message.   Attempted call back to patient and no answer.  Voice mail has not been set up and unable to leave a message.

## 2016-08-07 NOTE — Telephone Encounter (Signed)
Remote ICM transmission received.  Attempted patient call and voice mail not set up.

## 2016-08-07 NOTE — Progress Notes (Signed)
EPIC Encounter for ICM Monitoring  Patient Name: Chad Christensen is a 50 y.o. male Date: 08/07/2016 Primary Care Physican: No PCP Per Patient Primary Cardiologist: Allred Electrophysiologist: Allred Dry Weight:  unknown  Attempted ICM call and unable to reach.  Transmission reviewed.   Since 08/03/2016 transmission, thoracic impedance returned to normal after taking increased Furosemide x 3 days.  Follow-up plan: ICM clinic phone appointment on 09/09/2016.  Copy of ICM check sent to physician.   ICM trend: 08/06/2016       Karie Soda, RN 08/07/2016 8:41 AM

## 2016-08-09 ENCOUNTER — Other Ambulatory Visit: Payer: Self-pay | Admitting: Physician Assistant

## 2016-08-09 ENCOUNTER — Other Ambulatory Visit: Payer: Self-pay | Admitting: Cardiovascular Disease

## 2016-08-11 MED ORDER — FUROSEMIDE 40 MG PO TABS: 40.0000 mg | ORAL_TABLET | ORAL | 11 refills | Status: DC

## 2016-08-11 NOTE — Addendum Note (Signed)
Addended by: Karie Soda on: 08/11/2016 10:28 AM   Modules accepted: Orders

## 2016-08-11 NOTE — Progress Notes (Signed)
Returned call as requested by voice mail from patient.  He wanted more information on last ICM transmissions since his phone cut off last week. Advised after taking extra Furosemide fluid level returned to normal.  Confirmed he resumed prescribed dosage of Furosemide 40 mg every other day.   He asked for refill for Lasix due to he is running out since increasing the Lasix as recommended.  Confirmed  [id number] - [place], [place] - [address] AT [facility] [address] [place] [address] is preferred pharmacy.  He requested 30 day supply.  Next ICM remote transmission 09/09/2016.

## 2016-09-09 ENCOUNTER — Ambulatory Visit (INDEPENDENT_AMBULATORY_CARE_PROVIDER_SITE_OTHER): Payer: Medicare Other

## 2016-09-09 DIAGNOSIS — I5022 Chronic systolic (congestive) heart failure: Secondary | ICD-10-CM | POA: Diagnosis not present

## 2016-09-09 DIAGNOSIS — Z95810 Presence of automatic (implantable) cardiac defibrillator: Secondary | ICD-10-CM

## 2016-09-10 NOTE — Progress Notes (Signed)
EPIC Encounter for ICM Monitoring  Patient Name: Chad Christensen is a 50 y.o. male Date: 09/10/2016 Primary Care Physican: Betti Cruz, PA-C Primary Cardiologist: Allred Electrophysiologist: Allred Dry Weight:  unknown        Heart Failure questions reviewed, pt asymptomatic   Thoracic impedance abnormal suggesting fluid accumulation.  Recommendations:   Take Furosemide 40 mg 1 tablet x 5 days and then return to every other day as prescribed.   Patient prefer to take med everyday rather than doubling for a few days.   Follow-up plan: ICM clinic phone appointment on 09/16/2016 to recheck fluid levels.  Copy of ICM check sent to device physician.   ICM trend: 09/09/2016      Karie Soda, RN 09/10/2016 3:02 PM

## 2016-09-16 ENCOUNTER — Ambulatory Visit (INDEPENDENT_AMBULATORY_CARE_PROVIDER_SITE_OTHER): Payer: Medicare Other

## 2016-09-16 DIAGNOSIS — Z95810 Presence of automatic (implantable) cardiac defibrillator: Secondary | ICD-10-CM | POA: Diagnosis not present

## 2016-09-16 DIAGNOSIS — I5022 Chronic systolic (congestive) heart failure: Secondary | ICD-10-CM | POA: Diagnosis not present

## 2016-09-17 ENCOUNTER — Telehealth: Payer: Self-pay

## 2016-09-17 ENCOUNTER — Telehealth: Payer: Self-pay | Admitting: Internal Medicine

## 2016-09-17 NOTE — Telephone Encounter (Signed)
Remote ICM transmission received.  Attempted patient call and voice mail is not set up

## 2016-09-17 NOTE — Progress Notes (Signed)
EPIC Encounter for ICM Monitoring  Patient Name: Chad Christensen is a 50 y.o. male Date: 09/17/2016 Primary Care Physican: Betti Cruz, PA-C Primary Cardiologist:Allred Electrophysiologist: Allred Dry Weight:  unknown        Attempted ICM call and unable to reach.  Transmission reviewed.   Thoracic impedance returned to normal after taking Furosemide dosage x 5 consecutive days.  Follow-up plan: ICM clinic phone appointment on 10/13/2016.  Copy of ICM check sent to device physician.   ICM trend: 09/17/2016       Karie Soda, RN 09/17/2016 2:13 PM

## 2016-09-17 NOTE — Telephone Encounter (Signed)
New Message ° °Pt voiced he is returning nurses call. ° °Please f/u °

## 2016-09-21 NOTE — Progress Notes (Signed)
Returned patient call and reviewed transmission.  He stated he is doing well and denied any fluid symptoms.  No changes today and confirmed he returned to Furosemide prescribed dosage of 40 mg every other day.  Next ICM remote transmission 10/13/2016.

## 2016-09-21 NOTE — Telephone Encounter (Signed)
Spoke with patient.. See ICM note 

## 2016-10-13 ENCOUNTER — Ambulatory Visit (INDEPENDENT_AMBULATORY_CARE_PROVIDER_SITE_OTHER): Payer: Medicare Other

## 2016-10-13 DIAGNOSIS — Z95810 Presence of automatic (implantable) cardiac defibrillator: Secondary | ICD-10-CM | POA: Diagnosis not present

## 2016-10-13 DIAGNOSIS — I5022 Chronic systolic (congestive) heart failure: Secondary | ICD-10-CM

## 2016-10-14 ENCOUNTER — Telehealth: Payer: Self-pay

## 2016-10-14 NOTE — Progress Notes (Signed)
EPIC Encounter for ICM Monitoring  Patient Name: Chad Christensen is a 50 y.o. male Date: 10/14/2016 Primary Care Physican: Betti Cruz, PA-C Primary Cardiologist:Allred Electrophysiologist: Allred Dry Weight:     unknown                                                     Attempted ICM call and unable to reach.   Will attempt to contact patient again. Transmission reviewed.   Thoracic impedance abnormal suggesting fluid accumulation.  Labs: 06/12/2016 Creatinine 0.77, BUN 7, Potassium 5.1, Sodium 144  Follow-up plan: ICM clinic phone appointment on 10/22/2016 to repeat fluid level check.  Copy of ICM check sent to cardiologist and device physician.   ICM trend: 10/14/2016       Karie Soda, RN 10/14/2016 1:34 PM

## 2016-10-14 NOTE — Telephone Encounter (Signed)
Remote ICM transmission received.  Attempted patient call and voice mail option not set up.

## 2016-10-22 ENCOUNTER — Ambulatory Visit (INDEPENDENT_AMBULATORY_CARE_PROVIDER_SITE_OTHER): Payer: Medicare Other

## 2016-10-22 DIAGNOSIS — I5022 Chronic systolic (congestive) heart failure: Secondary | ICD-10-CM | POA: Diagnosis not present

## 2016-10-22 DIAGNOSIS — Z95810 Presence of automatic (implantable) cardiac defibrillator: Secondary | ICD-10-CM | POA: Diagnosis not present

## 2016-10-23 ENCOUNTER — Telehealth: Payer: Self-pay

## 2016-10-23 NOTE — Progress Notes (Signed)
EPIC Encounter for ICM Monitoring  Patient Name: Chad Christensen is a 50 y.o. male Date: 10/23/2016 Primary Care Physican: Betti Cruz, PA-C Primary Cardiologist:Allred Electrophysiologist: Allred Dry Weight:unknown       Attempted ICM call and unable to reach.  Transmission reviewed.   Thoracic impedance normal   Follow-up plan: ICM clinic phone appointment on 11/23/2016.  Copy of ICM check sent to device physician.   ICM trend: 10/22/2016       Karie Soda, RN 10/23/2016 10:59 AM

## 2016-10-23 NOTE — Telephone Encounter (Signed)
Remote ICM transmission received.  Attempted patient call and voice mail box has not been set up yet.

## 2016-11-23 ENCOUNTER — Ambulatory Visit (INDEPENDENT_AMBULATORY_CARE_PROVIDER_SITE_OTHER): Payer: Medicare Other

## 2016-11-23 DIAGNOSIS — Z95810 Presence of automatic (implantable) cardiac defibrillator: Secondary | ICD-10-CM

## 2016-11-23 DIAGNOSIS — I5022 Chronic systolic (congestive) heart failure: Secondary | ICD-10-CM | POA: Diagnosis not present

## 2016-11-23 NOTE — Progress Notes (Signed)
EPIC Encounter for ICM Monitoring  Patient Name: Chad Christensen is a 50 y.o. male Date: 11/23/2016 Primary Care Physican: Betti Cruz, PA-C Primary Cardiologist:Allred Electrophysiologist: Allred Dry Weight:unknown       Heart Failure questions reviewed, pt asymptomatic.  He stated he fell a couple of days ago and next day started feeling dizzy.  He has been in bed for a couple of days.  He stated the Warfarin dose was increased to 7 mg and he wonders if that is making him dizzy and he was going to adjust it to 5 mg.    Thoracic impedance normal.  Was abnormal suggesting fluid accumulation from 11/06/2016 to 11/15/2016.  Recommendations:    Advised not change Coumadin dosage without consulting the Coumadin clinic and/or his physician.  Advised to call doctor to follow up on fall or he should go to ER.  He verbalized understanding of recommendations.   Follow-up plan: ICM clinic phone appointment on 12/24/2016.  Copy of ICM check sent to device physician.   ICM trend: 11/23/2016       Karie Soda, RN 11/23/2016 3:51 PM

## 2016-12-07 DIAGNOSIS — I639 Cerebral infarction, unspecified: Secondary | ICD-10-CM

## 2016-12-07 HISTORY — DX: Cerebral infarction, unspecified: I63.9

## 2016-12-24 ENCOUNTER — Ambulatory Visit (INDEPENDENT_AMBULATORY_CARE_PROVIDER_SITE_OTHER): Payer: Medicare Other

## 2016-12-24 DIAGNOSIS — Z95810 Presence of automatic (implantable) cardiac defibrillator: Secondary | ICD-10-CM

## 2016-12-24 DIAGNOSIS — I5022 Chronic systolic (congestive) heart failure: Secondary | ICD-10-CM | POA: Diagnosis not present

## 2016-12-25 ENCOUNTER — Telehealth: Payer: Self-pay

## 2016-12-25 NOTE — Progress Notes (Signed)
EPIC Encounter for ICM Monitoring  Patient Name: Chad Christensen is a 51 y.o. male Date: 12/25/2016 Primary Care Physican: Betti Cruz, PA-C Primary Cardiologist:Allred Electrophysiologist: Allred Dry Weight:does not weigh         Heart Failure questions reviewed, pt asymptomatic   Thoracic impedance abnormal suggesting fluid accumulation since 12/21/2016 and also during time 11/28/2016 to 12/05/2016.  Labs 06/12/2016 Creatinine 0.77, BUN 7, Potassium 5.1, Sodium 144   Recommendations:  Advised to increase Furosemide 40 mg 1 tablet bid x 2 days and then 3rd day take 1 tablet every other day as prescribed.    Follow-up plan: ICM clinic phone appointment on 12/29/2016 to recheck fluid levels.  Advised he is due to make appointment with Dr [name].   Copy of ICM check sent to device physician.   3 month ICM trend: 12/24/2016    1 Year ICM trend:      Karie Soda, RN 12/25/2016 3:48 PM

## 2016-12-25 NOTE — Telephone Encounter (Signed)
Remote ICM transmission received.  Attempted patient call and no voice mail has been set up.

## 2016-12-29 ENCOUNTER — Telehealth: Payer: Self-pay

## 2016-12-29 ENCOUNTER — Ambulatory Visit (INDEPENDENT_AMBULATORY_CARE_PROVIDER_SITE_OTHER): Payer: Medicare Other

## 2016-12-29 DIAGNOSIS — I5022 Chronic systolic (congestive) heart failure: Secondary | ICD-10-CM

## 2016-12-29 DIAGNOSIS — Z95810 Presence of automatic (implantable) cardiac defibrillator: Secondary | ICD-10-CM

## 2016-12-29 NOTE — Telephone Encounter (Signed)
Call to patient and requested to send remote transmission to recheck fluid levels today.

## 2016-12-29 NOTE — Progress Notes (Signed)
EPIC Encounter for ICM Monitoring  Patient Name: Chad Christensen is a 51 y.o. male Date: 12/29/2016 Primary Care Physican: Betti Cruz, PA-C Primary Cardiologist:Allred Electrophysiologist: Allred Dry Weight:does not weigh       Heart Failure questions reviewed, pt asymptomatic   Thoracic impedance returned to normal after increase in Furosemide  Labs 06/12/2016 Creatinine 0.77, BUN 7, Potassium 5.1, Sodium 144  Recommendations: No changes. Reminded to limit dietary salt intake to 2000 mg/day and fluid intake to < 2 liters/day. Encouraged to call for fluid symptoms.  Follow-up plan: ICM clinic phone appointment on 01/26/2017.  Copy of ICM check sent to device physician.   3 month ICM trend: 12/29/2016   1 Year ICM trend:      Karie Soda, RN 12/29/2016 4:43 PM

## 2017-01-23 ENCOUNTER — Other Ambulatory Visit: Payer: Self-pay | Admitting: Internal Medicine

## 2017-01-23 DIAGNOSIS — I5022 Chronic systolic (congestive) heart failure: Secondary | ICD-10-CM

## 2017-01-25 NOTE — Telephone Encounter (Signed)
Medication Detail    Disp Refills Start End   furosemide (LASIX) 40 MG tablet 30 tablet 11 08/11/2016    Sig - Route: Take 1 tablet (40 mg total) by mouth every other day. - Oral   E-Prescribing Status: Receipt confirmed by pharmacy (08/11/2016 10:28 AM EDT)   Associated Diagnoses   Chronic systolic congestive heart failure [facility])     Pharmacy   [id number] - [place], [place] - [address] AT [facility] [address]

## 2017-01-26 ENCOUNTER — Ambulatory Visit (INDEPENDENT_AMBULATORY_CARE_PROVIDER_SITE_OTHER): Payer: Medicare Other

## 2017-01-26 DIAGNOSIS — I5022 Chronic systolic (congestive) heart failure: Secondary | ICD-10-CM

## 2017-01-26 DIAGNOSIS — Z95810 Presence of automatic (implantable) cardiac defibrillator: Secondary | ICD-10-CM

## 2017-01-26 NOTE — Progress Notes (Signed)
EPIC Encounter for ICM Monitoring  Patient Name: Chad Christensen is a 51 y.o. male Date: 01/26/2017 Primary Care Physican: Betti Cruz, PA-C Primary Cardiologist:Allred Electrophysiologist: Allred Dry Weight:does not weigh       Heart Failure questions reviewed, pt asymptomatic.  He has had the flu in the last 3 weeks.   Thoracic impedance normal with lot of peaks and valleys.  Current prescribed dose of Furosemide 40 mg 1 tablet every other day.   Labs 06/12/2016 Creatinine 0.77, BUN 7, Potassium 5.1, Sodium 144  Recommendations:  No changes. Reminded to limit dietary salt intake to 2000 mg/day and fluid intake to < 2 liters/day. Encouraged to call for fluid symptoms.  Follow-up plan: ICM clinic phone appointment on 02/26/2017.  Copy of ICM check sent to device physician.   3 month ICM trend: 01/26/2017   1 Year ICM trend:      Karie Soda, RN 01/26/2017 7:45 AM

## 2017-02-04 ENCOUNTER — Other Ambulatory Visit: Payer: Self-pay | Admitting: Internal Medicine

## 2017-02-15 NOTE — Progress Notes (Signed)
ICM remote transmission rescheduled from 02/26/2017 to 03/02/2017.  

## 2017-03-02 ENCOUNTER — Ambulatory Visit (INDEPENDENT_AMBULATORY_CARE_PROVIDER_SITE_OTHER): Payer: Medicare Other

## 2017-03-02 ENCOUNTER — Telehealth: Payer: Self-pay | Admitting: Cardiology

## 2017-03-02 DIAGNOSIS — Z95810 Presence of automatic (implantable) cardiac defibrillator: Secondary | ICD-10-CM | POA: Diagnosis not present

## 2017-03-02 DIAGNOSIS — I5022 Chronic systolic (congestive) heart failure: Secondary | ICD-10-CM | POA: Diagnosis not present

## 2017-03-02 NOTE — Progress Notes (Signed)
EPIC Encounter for ICM Monitoring  Patient Name: Chad Christensen is a 51 y.o. male Date: 03/02/2017 Primary Care Physican: Betti Cruz, PA-C Primary Cardiologist:Allred Electrophysiologist: Allred Dry Weight:does not weigh           Heart Failure questions reviewed, pt asymptomatic.   Thoracic impedance normal.  Current prescribed dose of Furosemide 40 mg 1 tablet every other day.   Labs 06/12/2016 Creatinine 0.77, BUN 7, Potassium 5.1, Sodium 144  Recommendations: No changes. Reminded to limit dietary salt intake to 2000 mg/day and fluid intake to < 2 liters/day. Encouraged to call for fluid symptoms.  Follow-up plan: ICM clinic phone appointment on 04/02/2017.  Copy of ICM check sent to device physician.   3 month ICM trend: 03/02/2017   1 Year ICM trend:      Karie Soda, RN 03/02/2017 3:32 PM

## 2017-03-02 NOTE — Telephone Encounter (Signed)
Spoke with pt and reminded pt of remote transmission that is due today. Pt verbalized understanding.   

## 2017-03-07 ENCOUNTER — Other Ambulatory Visit: Payer: Self-pay | Admitting: Internal Medicine

## 2017-03-23 ENCOUNTER — Telehealth: Payer: Self-pay | Admitting: *Deleted

## 2017-03-23 NOTE — Telephone Encounter (Signed)
Chad Christensen ICD alert received this morning for 2 VF episodes terminated with a shock on 03/21/17 and 03/22/17.  Mr. Chad Christensen said both times that he was sitting and heard ringing in his ears then a cool breeze near his head then he felt the shock. He denies syncope. He reports that he taking his Toprol XL once daily d/t feeling cold all the time when he took it twice daily. Other meds taking as prescribed.  Mr. Chad Christensen does not drive but was made aware of [employer] driving restrictions x 6 months. I will review with Dr. Johney Christensen next time he's in the office and call Mr. Boot back with recommendations. He is unable to schedule OVs at this time d/t lack of transportation and insurance. He is awaiting a call from social services for assistance.

## 2017-03-24 NOTE — Telephone Encounter (Signed)
Reviewed with Dr. [name]- VO for Dig level, TSH, BMET, and Mag to be drawn. F/u with general cardiology or JA next available.  I made Chad Christensen aware of Dr. Jenel Lucks recommendations- he reports that he had labs drawn last week at [facility] and would rather have those results sent to [place] than to be drawn again. Requested records via phone from [facility].   Chad Christensen declines f/u with Dr. Purvis Sheffield (saw last in June 2016). F/u with JA 03/30/17.

## 2017-03-24 NOTE — Telephone Encounter (Signed)
TSH, CMP, CBC and Lipid panel as well as office note from 03/19/17 received via fax

## 2017-03-26 ENCOUNTER — Other Ambulatory Visit: Payer: Self-pay | Admitting: Internal Medicine

## 2017-03-30 ENCOUNTER — Ambulatory Visit (INDEPENDENT_AMBULATORY_CARE_PROVIDER_SITE_OTHER): Payer: Medicare Other | Admitting: Internal Medicine

## 2017-03-30 ENCOUNTER — Encounter: Payer: Self-pay | Admitting: Internal Medicine

## 2017-03-30 VITALS — BP 116/68 | HR 89 | Ht 72.0 in | Wt 122.4 lb

## 2017-03-30 DIAGNOSIS — I472 Ventricular tachycardia, unspecified: Secondary | ICD-10-CM

## 2017-03-30 DIAGNOSIS — I420 Dilated cardiomyopathy: Secondary | ICD-10-CM

## 2017-03-30 DIAGNOSIS — I10 Essential (primary) hypertension: Secondary | ICD-10-CM

## 2017-03-30 MED ORDER — METOPROLOL SUCCINATE ER 25 MG PO TB24: 50.0000 mg | ORAL_TABLET | Freq: Every day | ORAL | 6 refills | Status: DC

## 2017-03-30 NOTE — Patient Instructions (Addendum)
Medication Instructions:   Change Toprol XL to taking 2 of the 25mg  tabs daily.  Continue all other medications.    Labwork: none  Testing/Procedures: none  Follow-Up: 3 months - Dr. [name].   Any Other Special Instructions Will Be Listed Below (If Applicable). Remote monitoring is used to monitor your Pacemaker of ICD from home. This monitoring reduces the number of office visits required to check your device to one time per year. It allows [place] to keep an eye on the functioning of your device to ensure it is working properly. You are scheduled for a device check from home on 06/29/2017. You may send your transmission at any time that day. If you have a wireless device, the transmission will be sent automatically. After your physician reviews your transmission, you will receive a postcard with your next transmission date.  If you need a refill on your cardiac medications before your next appointment, please call your pharmacy.

## 2017-03-30 NOTE — Progress Notes (Unsigned)
ICM remote transmission rescheduled from 04/02/2017 due to patient has defib check in office today with Dr [name] to 04/30/2017.

## 2017-03-30 NOTE — Progress Notes (Signed)
Primary Cardiologist:  None (previously Dr Purvis Sheffield).   Chad Christensen is a 51 y.o. male who presents today for routine electrophysiology followup.   Remains active.  SOB is stable.  Continues to smoke.   Recent appropriate therapy for very fast VT.  He was sitting on his porch morning of 03/21/17 and had an appropriate ICD shock.  Similar event the next day.  Today, he denies symptoms of palpitations, exertional chest pain,  lower extremity edema, dizziness, presyncope, or syncope.  The patient is otherwise without complaint today.   Past Medical History:  Diagnosis Date  . Gout   . Hypertension   . Polysubstance abuse    In remission since 2003  . Renal insufficiency   . Systolic heart failure    Secondary to nonischemic cardiomyopathy  . Tobacco user   . Ventricular tachycardia (HCC) 04/23/12   CL with appropriate ICD shock delivered   Past Surgical History:  Procedure Laterality Date  . CARDIAC DEFIBRILLATOR PLACEMENT     SJM ICD implanted by [facility] 08/15/10  . CERVICAL SPINE SURGERY    . HERNIA REPAIR     Bilateral  . LUMBAR SPINE SURGERY    . THORACOTOMY  9/09   Left; for rib fracture and associated pneumothorax    Current Outpatient Prescriptions  Medication Sig Dispense Refill  . albuterol (ACCUNEB) 1.25 MG/3ML nebulizer solution Take 1 ampule by nebulization every 6 (six) hours as needed for wheezing.    Marland Kitchen aspirin 81 MG EC tablet Take 81 mg by mouth daily.      . clonazePAM (KLONOPIN) 0.5 MG tablet Take 0.5 mg by mouth 2 (two) times daily as needed for anxiety.    . digoxin (DIGOX) 0.25 MG tablet Take 1 tablet (250 mcg total) by mouth daily. *Patient is overdue for an appointment and needs to call and schedule for further refills* 15 tablet 0  . furosemide (LASIX) 40 MG tablet Take 1 tablet (40 mg total) by mouth every other day. 30 tablet 11  . Melatonin 5 MG TABS Take 3 tablets by mouth daily.     . metoprolol succinate (TOPROL-XL) 25 MG 24 hr tablet Take 1  tablet (25 mg total) by mouth 2 (two) times daily. *Patient overdue for an appt and needs to call and schedule for further refills* 30 tablet 0  . multivitamin (THERAGRAN) per tablet Take 1 tablet by mouth daily.      . simvastatin (ZOCOR) 5 MG tablet Take 5 mg by mouth daily.    Marland Kitchen spironolactone (ALDACTONE) 25 MG tablet Take 1 tablet (25 mg total) by mouth daily. *Patient is overdue for an appointment and needs to call and schedule for further refills* 15 tablet 0  . warfarin (COUMADIN) 5 MG tablet Take 5 mg by mouth. Take as directed    . COLCRYS 0.6 MG tablet     . warfarin (COUMADIN) 1 MG tablet      No current facility-administered medications for this visit.     Physical Exam: Vitals:   03/30/17 1158  BP: 116/68  Pulse: 89  SpO2: 98%  Weight: 122 lb 6.4 oz (55.5 kg)  Height: 6' (1.829 m)    GEN- The patient is thin appearing, alert and oriented x 3 today.   Head- normocephalic, atraumatic Eyes-  Sclera clear, conjunctiva pink Ears- hearing intact Oropharynx- clear Lungs- Clear to ausculation bilaterally, normal work of breathing Chest- ICD pocket is well healed Heart- Regular rate and rhythm, no murmurs, rubs or  gallops, PMI not laterally displaced GI- soft, NT, ND, + BS Extremities- no clubbing, cyanosis, or edema  ICD interrogation- personally reviewed in detail today,  See PACEART report  Assessment and Plan:  1.  Chronic systolic dysfunction euvolemic today Stable on an appropriate medical regimen Normal ICD function See Arita Miss Art report Recent labs reviewed  2. VT/VF He had appropriate therapy delivered 03/21/17 and 03/22/17 for VT with CL of 220 msec. Increase toprol to 50mg  daily Recent labs reviewed He does not drive  3. HTN Stable No change required today  4. Tobacco Cessation advsied He is not ready to quit  Merlin Return to see me in 3 months in [place] I have stressed importance of compliance He is instructed to establish with general cardiology  in [facility] MD, [facility] 03/30/2017 12:27 PM

## 2017-04-15 ENCOUNTER — Other Ambulatory Visit: Payer: Self-pay | Admitting: Internal Medicine

## 2017-04-15 ENCOUNTER — Telehealth: Payer: Self-pay

## 2017-04-15 NOTE — Telephone Encounter (Signed)
Spoke with pt regarding shock from 5/9 at 7:40 am, pt doesn't remember shock only that his left shoulder was sore. Pt stated that he had taken his Toprol-XL and Digoxin at 5:30 am. Pt aware of driving restriction and shock plan. Informed pt that episode would be reviewed with Dr. Ladona Ridgel  And would call back with any recommendations. Pt voiced understanding.

## 2017-04-30 ENCOUNTER — Telehealth: Payer: Self-pay

## 2017-04-30 ENCOUNTER — Ambulatory Visit (INDEPENDENT_AMBULATORY_CARE_PROVIDER_SITE_OTHER): Payer: Medicare Other

## 2017-04-30 DIAGNOSIS — Z95810 Presence of automatic (implantable) cardiac defibrillator: Secondary | ICD-10-CM

## 2017-04-30 DIAGNOSIS — I5022 Chronic systolic (congestive) heart failure: Secondary | ICD-10-CM | POA: Diagnosis not present

## 2017-04-30 NOTE — Progress Notes (Signed)
EPIC Encounter for ICM Monitoring  Patient Name: Chad Christensen is a 51 y.o. male Date: 04/30/2017 Primary Care Physican: Julio Sicks Primary Cardiologist:Allred Electrophysiologist: Allred Dry Weight:does not weigh       Patient stated he is feeling well and denied any fluid symptoms.    Thoracic impedance normal.  Current prescribed dose of Furosemide 40 mg 1 tablet every other day.   Labs 03/19/2017 Creatinine 0.71, BUN 8, Potassium 4.8, Sodium 137, EGFR >57 06/12/2016 Creatinine 0.77, BUN 7, Potassium 5.1, Sodium 144  Recommendations: No changes. Discussed to limit salt intake to 2000 mg/day and fluid intake to < 2 liters/day.  Encouraged to call for fluid symptoms or use local ER for any urgent symptoms.  Follow-up plan: ICM clinic phone appointment on 05/31/2017.    Copy of ICM check sent to device physician.   3 month ICM trend: 04/30/2017   1 Year ICM trend:      Rosalene Billings, RN 04/30/2017 9:06 AM

## 2017-04-30 NOTE — Telephone Encounter (Signed)
Remote ICM transmission received.  Attempted patient call and mail box has not been set up.

## 2017-05-31 ENCOUNTER — Ambulatory Visit (INDEPENDENT_AMBULATORY_CARE_PROVIDER_SITE_OTHER): Payer: Medicare Other

## 2017-05-31 ENCOUNTER — Telehealth: Payer: Self-pay | Admitting: Cardiology

## 2017-05-31 DIAGNOSIS — I5022 Chronic systolic (congestive) heart failure: Secondary | ICD-10-CM | POA: Diagnosis not present

## 2017-05-31 DIAGNOSIS — Z95810 Presence of automatic (implantable) cardiac defibrillator: Secondary | ICD-10-CM | POA: Diagnosis not present

## 2017-05-31 NOTE — Telephone Encounter (Signed)
Confirmed remote transmission w/ pt wife.   

## 2017-06-01 MED ORDER — FUROSEMIDE 40 MG PO TABS: ORAL_TABLET | ORAL | 3 refills | Status: DC

## 2017-06-01 NOTE — Progress Notes (Signed)
EPIC Encounter for ICM Monitoring  Patient Name: Chad Christensen is a 51 y.o. male Date: 06/01/2017 Primary Care Physican: Julio Sicks Primary Cardiologist:Allred Electrophysiologist: Allred Dry Weight:does not weigh         Heart Failure questions reviewed, pt asymptomatic for fluid symptoms.    Patient had a fall at home and discharged from [facility] 05/31/2017.  He had IV fluids x 2 days at the hospital.     Thoracic impedance abnormal suggesting fluid accumulation starting 05/19/2017 which some days correlate with hospitalization.  Prescribed: Furosemide 40 mg 1 tablet every other day.   Labs 03/19/2017 Creatinine 0.71, BUN 8, Potassium 4.8, Sodium 137, EGFR >57 06/12/2016 Creatinine 0.77, BUN 7, Potassium 5.1, Sodium 144  Recommendations: Advised to take Furosemide 40 mg 1 tablet every day for next 5 days and then resume 40 mg every other day.  Patient prefers Furosemide be a 90 day supply and script sent electronically to pharmacy. .   Follow-up plan: ICM clinic phone appointment on 06/10/2017.  Office appointment scheduled 07/02/2017 with Dr. Rayann Heman.  Copy of ICM check sent to device physician.   3 month ICM trend: 06/01/2017   1 Year ICM trend:      Rosalene Billings, RN 06/01/2017 8:30 AM

## 2017-06-04 NOTE — Progress Notes (Signed)
Received: 2 days ago  Message Contents  Allred, James, MD  Georgeann Brinkman S, RN        Good     

## 2017-06-10 ENCOUNTER — Ambulatory Visit (INDEPENDENT_AMBULATORY_CARE_PROVIDER_SITE_OTHER): Payer: Medicare Other

## 2017-06-10 DIAGNOSIS — Z95810 Presence of automatic (implantable) cardiac defibrillator: Secondary | ICD-10-CM

## 2017-06-10 DIAGNOSIS — I5022 Chronic systolic (congestive) heart failure: Secondary | ICD-10-CM

## 2017-06-10 NOTE — Progress Notes (Signed)
EPIC Encounter for ICM Monitoring  Patient Name: Chad Christensen is a 51 y.o. male Date: 06/10/2017 Primary Care Physican: Julio Sicks Primary Cardiologist:Allred Electrophysiologist: Allred Dry Weight:does not weigh      Heart Failure questions reviewed, pt asymptomatic.   Thoracic impedance returned to normal after taking Furosemide 40 mg x 5 days.   Prescribed: Furosemide 40 mg 1 tablet every other day.   Labs 03/19/2017 Creatinine 0.71, BUN 8, Potassium 4.8, Sodium 137, EGFR >57 06/12/2016 Creatinine 0.77, BUN 7, Potassium 5.1, Sodium 144  Recommendations: No changes.  Encouraged to call for fluid symptoms.  Follow-up plan: ICM clinic phone appointment on 08/02/2017 since he has an office appointment scheduled 07/02/2017 with Dr. Rayann Heman.  Copy of ICM check sent to device physician.   3 month ICM trend: 06/10/2017   1 Year ICM trend:      Rosalene Billings, RN 06/10/2017 9:23 AM

## 2017-07-02 ENCOUNTER — Encounter: Payer: Self-pay | Admitting: *Deleted

## 2017-07-02 ENCOUNTER — Ambulatory Visit (INDEPENDENT_AMBULATORY_CARE_PROVIDER_SITE_OTHER): Payer: Medicare Other | Admitting: Internal Medicine

## 2017-07-02 ENCOUNTER — Encounter: Payer: Self-pay | Admitting: Internal Medicine

## 2017-07-02 VITALS — BP 100/58 | HR 57 | Ht 72.0 in | Wt 121.0 lb

## 2017-07-02 DIAGNOSIS — I5022 Chronic systolic (congestive) heart failure: Secondary | ICD-10-CM | POA: Diagnosis not present

## 2017-07-02 DIAGNOSIS — I420 Dilated cardiomyopathy: Secondary | ICD-10-CM | POA: Diagnosis not present

## 2017-07-02 DIAGNOSIS — I472 Ventricular tachycardia, unspecified: Secondary | ICD-10-CM

## 2017-07-02 DIAGNOSIS — I10 Essential (primary) hypertension: Secondary | ICD-10-CM | POA: Diagnosis not present

## 2017-07-02 LAB — CUP PACEART INCLINIC DEVICE CHECK
Battery Remaining Longevity: 45 mo
Brady Statistic RV Percent Paced: 0.09 %
Date Time Interrogation Session: 20180727144813
HighPow Impedance: 83.25 Ohm
Implantable Lead Implant Date: 20110909
Implantable Lead Location: 753860
Implantable Lead Model: 7122
Implantable Pulse Generator Implant Date: 20110909
Lead Channel Impedance Value: 575 Ohm
Lead Channel Pacing Threshold Amplitude: 0.75 V
Lead Channel Pacing Threshold Amplitude: 0.75 V
Lead Channel Pacing Threshold Pulse Width: 0.5 ms
Lead Channel Pacing Threshold Pulse Width: 0.5 ms
Lead Channel Sensing Intrinsic Amplitude: 10.5 mV
Lead Channel Setting Pacing Amplitude: 2.5 V
Lead Channel Setting Pacing Pulse Width: 0.5 ms
Lead Channel Setting Sensing Sensitivity: 0.5 mV
Pulse Gen Serial Number: 614505

## 2017-07-02 NOTE — Progress Notes (Addendum)
PCP: Betti Cruz, PA-C Primary Cardiologist: previously Dr Paralee Cancel is a 51 y.o. male who presents today for routine electrophysiology followup.  Since last being seen in our clinic, the patient reports doing reasonably well.  He did have one ICD shock for fast VT 5/18.  He has had no other symptoms of arrhythmia.  His primary concern is with unsteadiness and dizziness.  He has vertigo s/p prior stroke.  He was seen at [facility] ED 05/31/17 after being found down in his yard by EMS.  He was intoxicated and had fallen, hitting his head.  He tells me today that he is no longer drinking.  Today, he denies symptoms of palpitations, chest pain, shortness of breath,  lower extremity edema, dizziness, presyncope, syncope, or ICD shocks.  The patient is otherwise without complaint today.   Past Medical History:  Diagnosis Date  . Gout   . Hypertension   . Paroxysmal atrial fibrillation (HCC)   . Polysubstance abuse    In remission since 2003  . Renal insufficiency   . Systolic heart failure    Secondary to nonischemic cardiomyopathy  . Tobacco user   . Ventricular tachycardia (HCC) 04/23/12   CL with appropriate ICD shock delivered   Past Surgical History:  Procedure Laterality Date  . CARDIAC DEFIBRILLATOR PLACEMENT     SJM ICD implanted by [facility] 08/15/10  . CERVICAL SPINE SURGERY    . HERNIA REPAIR     Bilateral  . LUMBAR SPINE SURGERY    . THORACOTOMY  9/09   Left; for rib fracture and associated pneumothorax    ROS- all systems are reviewed and negative except as per HPI above  Current Outpatient Prescriptions  Medication Sig Dispense Refill  . albuterol (ACCUNEB) 1.25 MG/3ML nebulizer solution Take 1 ampule by nebulization every 6 (six) hours as needed for wheezing.    Marland Kitchen aspirin 81 MG EC tablet Take 81 mg by mouth daily.      . clonazePAM (KLONOPIN) 0.5 MG tablet Take 0.5 mg by mouth 2 (two) times daily as needed for anxiety.    . COLCRYS 0.6  MG tablet     . digoxin (DIGOX) 0.25 MG tablet Take 1 tablet (250 mcg total) by mouth daily. *Patient is overdue for an appointment and needs to call and schedule for further refills* 15 tablet 0  . furosemide (LASIX) 40 MG tablet Take 1 tablet (40 mg total) every other day. 45 tablet 3  . Melatonin 5 MG TABS Take 3 tablets by mouth daily.     . metoprolol succinate (TOPROL-XL) 25 MG 24 hr tablet Take 2 tablets (50 mg total) by mouth daily. 60 tablet 6  . multivitamin (THERAGRAN) per tablet Take 1 tablet by mouth daily.      . simvastatin (ZOCOR) 5 MG tablet Take 5 mg by mouth daily.    Marland Kitchen spironolactone (ALDACTONE) 25 MG tablet Take 1 tablet (25 mg total) by mouth daily. *Patient is overdue for an appointment and needs to call and schedule for further refills* 15 tablet 0  . warfarin (COUMADIN) 1 MG tablet     . warfarin (COUMADIN) 5 MG tablet Take 5 mg by mouth. Take as directed     No current facility-administered medications for this visit.     Physical Exam: Vitals:   07/02/17 1037  BP: (!) 100/58  Pulse: (!) 57  SpO2: 95%  Weight: 121 lb (54.9 kg)  Height: 6' (1.829 m)  GEN- The patient is well appearing, alert and oriented x 3 today.   Head- normocephalic, atraumatic Eyes-  Sclera clear, conjunctiva pink Ears- hearing intact Oropharynx- clear Lungs- Clear to ausculation bilaterally, normal work of breathing Chest- ICD pocket is well healed Heart- Regular rate and rhythm, no murmurs, rubs or gallops, PMI not laterally displaced GI- soft, NT, ND, + BS Extremities- no clubbing, cyanosis, or edema  ICD interrogation- reviewed in detail today,  See PACEART report 26 pages of records from [place] are reviewed today  Assessment and Plan:  1.  Chronic systolic dysfunction euvolemic today Stable on an appropriate medical regimen Normal ICD function See Pace Art report No changes today 2 gram sodium diet is encouraged coreview is slightly abnormal today.  He will  avoid salt. Randon Goldsmith will follow-up on this with him.  2. VT/VF Single episode of fast VT requiring an ICD shock since I saw him last Normal ICD function  3. HTN Stable No change required today  4. Tobacco He is not ready to quit  5. Atrial fibrillation No recent episodes May need to consider stopping anticoagulation if further falls, though given prior stroke (1 year ago), I am reluctant to do so ETOH avoidance would be beneficial   [facility] device clinic is following Return to see me in 6 months in [place] I have stressed importance of compliance and avoidance of ETOH He is again instructed to establish with general cardiology in [facility] MD, [facility] 07/02/2017 11:38 AM

## 2017-07-02 NOTE — Patient Instructions (Addendum)
Medication Instructions:   Your physician recommends that you continue on your current medications as directed. Please refer to the Current Medication list given to you today.  Labwork:  NONE  Testing/Procedures:  NONE  Follow-Up: Your physician recommends that you schedule a follow-up appointment in: 6 months. Please schedule this appointment today before leaving the office.  Any Other Special Instructions Will Be Listed Below (If Applicable).  Your next device check from home is on 10/04/17.  Your physician has recommended that you follow a 2000 mg low sodium diet per day. Please use the information given to you today as a guideline.  If you need a refill on your cardiac medications before your next appointment, please call your pharmacy. Low-Sodium Eating Plan Sodium, which is an element that makes up salt, helps you maintain a healthy balance of fluids in your body. Too much sodium can increase your blood pressure and cause fluid and waste to be held in your body. Your health care provider or dietitian may recommend following this plan if you have high blood pressure (hypertension), kidney disease, liver disease, or heart failure. Eating less sodium can help lower your blood pressure, reduce swelling, and protect your heart, liver, and kidneys. What are tips for following this plan? General guidelines  Most people on this plan should limit their sodium intake to 1,500-2,000 mg (milligrams) of sodium each day. Reading food labels  The Nutrition Facts label lists the amount of sodium in one serving of the food. If you eat more than one serving, you must multiply the listed amount of sodium by the number of servings.  Choose foods with less than 140 mg of sodium per serving.  Avoid foods with 300 mg of sodium or more per serving. Shopping  Look for lower-sodium products, often labeled as "low-sodium" or "no salt added."  Always check the sodium content even if foods are  labeled as "unsalted" or "no salt added".  Buy fresh foods. ? Avoid canned foods and premade or frozen meals. ? Avoid canned, cured, or processed meats  Buy breads that have less than 80 mg of sodium per slice. Cooking  Eat more home-cooked food and less restaurant, buffet, and fast food.  Avoid adding salt when cooking. Use salt-free seasonings or herbs instead of table salt or sea salt. Check with your health care provider or pharmacist before using salt substitutes.  Cook with plant-based oils, such as canola, sunflower, or olive oil. Meal planning  When eating at a restaurant, ask that your food be prepared with less salt or no salt, if possible.  Avoid foods that contain MSG (monosodium glutamate). MSG is sometimes added to [place] food, bouillon, and some canned foods. What foods are recommended? The items listed may not be a complete list. Talk with your dietitian about what dietary choices are best for you. Grains Low-sodium cereals, including oats, puffed wheat and rice, and shredded wheat. Low-sodium crackers. Unsalted rice. Unsalted pasta. Low-sodium bread. Whole-grain breads and whole-grain pasta. Vegetables Fresh or frozen vegetables. "No salt added" canned vegetables. "No salt added" tomato sauce and paste. Low-sodium or reduced-sodium tomato and vegetable juice. Fruits Fresh, frozen, or canned fruit. Fruit juice. Meats and other protein foods Fresh or frozen (no salt added) meat, poultry, seafood, and fish. Low-sodium canned tuna and salmon. Unsalted nuts. Dried peas, beans, and lentils without added salt. Unsalted canned beans. Eggs. Unsalted nut butters. Dairy Milk. Soy milk. Cheese that is naturally low in sodium, such as ricotta cheese, fresh mozzarella, or  Swiss cheese Low-sodium or reduced-sodium cheese. Cream cheese. Yogurt. Fats and oils Unsalted butter. Unsalted margarine with no trans fat. Vegetable oils such as canola or olive oils. Seasonings and other  foods Fresh and dried herbs and spices. Salt-free seasonings. Low-sodium mustard and ketchup. Sodium-free salad dressing. Sodium-free light mayonnaise. Fresh or refrigerated horseradish. Lemon juice. Vinegar. Homemade, reduced-sodium, or low-sodium soups. Unsalted popcorn and pretzels. Low-salt or salt-free chips. What foods are not recommended? The items listed may not be a complete list. Talk with your dietitian about what dietary choices are best for you. Grains Instant hot cereals. Bread stuffing, pancake, and biscuit mixes. Croutons. Seasoned rice or pasta mixes. Noodle soup cups. Boxed or frozen macaroni and cheese. Regular salted crackers. Self-rising flour. Vegetables Sauerkraut, pickled vegetables, and relishes. Olives. [place] fries. Onion rings. Regular canned vegetables (not low-sodium or reduced-sodium). Regular canned tomato sauce and paste (not low-sodium or reduced-sodium). Regular tomato and vegetable juice (not low-sodium or reduced-sodium). Frozen vegetables in sauces. Meats and other protein foods Meat or fish that is salted, canned, smoked, spiced, or pickled. Bacon, ham, sausage, hotdogs, corned beef, chipped beef, packaged lunch meats, salt pork, jerky, pickled herring, anchovies, regular canned tuna, sardines, salted nuts. Dairy Processed cheese and cheese spreads. Cheese curds. Blue cheese. Feta cheese. String cheese. Regular cottage cheese. Buttermilk. Canned milk. Fats and oils Salted butter. Regular margarine. Ghee. Bacon fat. Seasonings and other foods Onion salt, garlic salt, seasoned salt, table salt, and sea salt. Canned and packaged gravies. Worcestershire sauce. Tartar sauce. Barbecue sauce. Teriyaki sauce. Soy sauce, including reduced-sodium. Steak sauce. Fish sauce. Oyster sauce. Cocktail sauce. Horseradish that you find on the shelf. Regular ketchup and mustard. Meat flavorings and tenderizers. Bouillon cubes. Hot sauce and Tabasco sauce. Premade or packaged  marinades. Premade or packaged taco seasonings. Relishes. Regular salad dressings. Salsa. Potato and tortilla chips. Corn chips and puffs. Salted popcorn and pretzels. Canned or dried soups. Pizza. Frozen entrees and pot pies. Summary  Eating less sodium can help lower your blood pressure, reduce swelling, and protect your heart, liver, and kidneys.  Most people on this plan should limit their sodium intake to 1,500-2,000 mg (milligrams) of sodium each day.  Canned, boxed, and frozen foods are high in sodium. Restaurant foods, fast foods, and pizza are also very high in sodium. You also get sodium by adding salt to food.  Try to cook at home, eat more fresh fruits and vegetables, and eat less fast food, canned, processed, or prepared foods. This information is not intended to replace advice given to you by your health care provider. Make sure you discuss any questions you have with your health care provider. Document Released: 05/15/2002 Document Revised: 11/16/2016 Document Reviewed: 11/16/2016 Elsevier Interactive Patient Education  2017 [employer].

## 2017-08-02 ENCOUNTER — Ambulatory Visit (INDEPENDENT_AMBULATORY_CARE_PROVIDER_SITE_OTHER): Payer: Medicare Other

## 2017-08-02 DIAGNOSIS — Z95810 Presence of automatic (implantable) cardiac defibrillator: Secondary | ICD-10-CM

## 2017-08-02 DIAGNOSIS — I5022 Chronic systolic (congestive) heart failure: Secondary | ICD-10-CM | POA: Diagnosis not present

## 2017-08-02 NOTE — Progress Notes (Signed)
EPIC Encounter for ICM Monitoring  Patient Name: Chad Christensen is a 51 y.o. male Date: 08/02/2017 Primary Care Physican: Julio Sicks Primary Cardiologist:Allred Electrophysiologist: Allred Dry Weight:does not weigh      Heart Failure questions reviewed, pt asymptomatic.  Patient has increased fluid intake due to Gout flare which is starting to improve.   Thoracic impedance abnormal suggesting fluid accumulation since 07/29/2017 but almost at baseline today.  Prescribed dosage: Furosemide 40 mg 1 tablet every other day.   Labs 05/31/2017 Creatinine 0.62, BUN 10, Potassium 3.4, Sodium 138, EGFR 115-133 03/19/2017 Creatinine 0.71, BUN 8,   Potassium 4.8, Sodium 137, EGFR >57 06/12/2016 Creatinine 0.77, BUN 7,   Potassium 5.1, Sodium 144  Recommendations: No changes today due to recent gout flare and is cutting back on fluid intake sincegout is improving.  Impedance close to baseline today.  Advised to limit fluid intake to < 2 liters/day.  Encouraged to call for fluid symptoms.  Follow-up plan: ICM clinic phone appointment on 08/17/2017 to recheck fluid levels.  Copy of ICM check sent to Dr. Rayann Heman.   3 month ICM trend: 08/02/2017   1 Year ICM trend:      Rosalene Billings, RN 08/02/2017 11:24 AM

## 2017-08-17 ENCOUNTER — Ambulatory Visit (INDEPENDENT_AMBULATORY_CARE_PROVIDER_SITE_OTHER): Payer: Self-pay

## 2017-08-17 ENCOUNTER — Telehealth: Payer: Self-pay

## 2017-08-17 DIAGNOSIS — Z95810 Presence of automatic (implantable) cardiac defibrillator: Secondary | ICD-10-CM

## 2017-08-17 DIAGNOSIS — I5022 Chronic systolic (congestive) heart failure: Secondary | ICD-10-CM

## 2017-08-17 NOTE — Telephone Encounter (Signed)
Remote ICM transmission received.  Attempted call to patient and voice mail box has not been set up.  

## 2017-08-17 NOTE — Progress Notes (Signed)
EPIC Encounter for ICM Monitoring  Patient Name: Chad Christensen is a 51 y.o. male Date: 08/17/2017 Primary Care Physican: Julio Sicks Primary Cardiologist:Allred Electrophysiologist: Allred Dry Weight:does not weigh         Attempted call to patient and unable to reach.   Transmission reviewed.    Thoracic impedance abnormal suggesting fluid accumulation since 08/14/2017 and trending back to baseline.  Prescribed dosage: Furosemide 40 mg 1 tablet every other day.   Labs 05/31/2017 Creatinine 0.62, BUN 10, Potassium 3.4, Sodium 138, EGFR 115-133 03/19/2017 Creatinine 0.71, BUN 8,   Potassium 4.8, Sodium 137, EGFR >57 06/12/2016 Creatinine 0.77, BUN 7,   Potassium 5.1, Sodium 144  Recommendations: NONE - Unable to reach patient   Follow-up plan: ICM clinic phone appointment on 09/09/2017.    Copy of ICM check sent to Dr. Rayann Heman.   3 month ICM trend: 08/17/2017   1 Year ICM trend:      Rosalene Billings, RN 08/17/2017 9:11 AM

## 2017-09-09 ENCOUNTER — Ambulatory Visit (INDEPENDENT_AMBULATORY_CARE_PROVIDER_SITE_OTHER): Payer: Medicare Other

## 2017-09-09 DIAGNOSIS — Z95810 Presence of automatic (implantable) cardiac defibrillator: Secondary | ICD-10-CM

## 2017-09-09 DIAGNOSIS — I5022 Chronic systolic (congestive) heart failure: Secondary | ICD-10-CM | POA: Diagnosis not present

## 2017-09-09 NOTE — Progress Notes (Signed)
EPIC Encounter for ICM Monitoring  Patient Name: Chad Christensen is a 51 y.o. male Date: 09/09/2017 Primary Care Physican: Julio Sicks Primary Cardiologist:Allred Electrophysiologist: Allred Dry Weight:does not weigh      Heart Failure questions reviewed, pt asymptomatic.   Thoracic impedance normal.  Prescribed dosage: Furosemide 40 mg 1 tablet every other day.   Labs 05/31/2017 Creatinine 0.62, BUN 10, Potassium 3.4, Sodium 138, EGFR 115-133 03/19/2017 Creatinine 0.71, BUN 8, Potassium 4.8, Sodium 137, EGFR >57 06/12/2016 Creatinine 0.77, BUN 7, Potassium 5.1, Sodium 144  Recommendations: No changes.   Encouraged to call for fluid symptoms.  Follow-up plan: ICM clinic phone appointment on 10/12/2017.   Copy of ICM check sent to Dr. Rayann Heman.   3 month ICM trend: 09/09/2017   1 Year ICM trend:      Rosalene Billings, RN 09/09/2017 9:00 AM

## 2017-10-12 ENCOUNTER — Ambulatory Visit (INDEPENDENT_AMBULATORY_CARE_PROVIDER_SITE_OTHER): Payer: Medicare Other | Admitting: *Deleted

## 2017-10-12 DIAGNOSIS — I5022 Chronic systolic (congestive) heart failure: Secondary | ICD-10-CM

## 2017-10-12 DIAGNOSIS — Z95810 Presence of automatic (implantable) cardiac defibrillator: Secondary | ICD-10-CM | POA: Diagnosis not present

## 2017-10-12 DIAGNOSIS — I420 Dilated cardiomyopathy: Secondary | ICD-10-CM

## 2017-10-12 NOTE — Progress Notes (Signed)
Remote ICD transmission.   

## 2017-10-12 NOTE — Progress Notes (Signed)
EPIC Encounter for ICM Monitoring  Patient Name: Chad Christensen is a 51 y.o. male Date: 10/12/2017 Primary Care Physican: Julio Sicks Primary Cardiologist:Allred Electrophysiologist: Allred Dry Weight:does not weigh      Heart Failure questions reviewed, pt asymptomatic for fluid symptoms.  He reported he thought he had a device shock last week that woke him up while he was sleeping.  He reported feeling fine the next day. I confirmed shock occurred on 10/03/2017.   He confirmed taking all medications and no skipping any dosages.  Advised per DMV he cannot drive x 6 months from date of shock, 10-03-2017.  He reported he has not driving in 14 years.  Transmission was reviewed by Dr Rayann Heman per device nurse Memory Dance, RN.  No changes recommended.    Corvue: Thoracic impedance normal but was abnormal suggesting fluid accumulation from 09/11/2017 - 09/19/2017, 09/23/2017 - 09/29/2017 and 10/04/2017-10/09/2017.  Prescribed dosage: Furosemide 40 mg 1 tablet every other day.   Labs 05/31/2017 Creatinine 0.62, BUN 10, Potassium 3.4, Sodium 138, EGFR 115-133 03/19/2017 Creatinine 0.71, BUN 8, Potassium 4.8, Sodium 137, EGFR >57 06/12/2016 Creatinine 0.77, BUN 7, Potassium 5.1, Sodium 144  Recommendations:  No changes. Advised to call for any problems or fluid symptoms.  Advised to limit salt intake.   Follow-up plan: ICM clinic phone appointment on 11/15/2017.  Office appointment scheduled 12/10/2017 with Dr. Rayann Heman.  Copy of ICM check sent to Dr. Rayann Heman.   3 month ICM trend: 10/12/2017    1 Year ICM trend:       Rosalene Billings, RN 10/12/2017 2:17 PM

## 2017-10-14 ENCOUNTER — Encounter: Payer: Self-pay | Admitting: Cardiology

## 2017-10-19 ENCOUNTER — Telehealth: Payer: Self-pay | Admitting: Internal Medicine

## 2017-10-19 NOTE — Telephone Encounter (Signed)
Alert reviewed. Presenting rhythm: Vs. (1) VT episode recorded x 14sec @226bpm , treated successfully with 25J shock. Stable thoracic impedance. Normal device function. Patient states that he was in bed during the time of the episode. Patient denies any sx's of CP, ShOB, N/V, dizziness, or syncope prior to the shock. Patient states that he's been taking all of his medications as Rx'd. Patient aware of [place] driving restriction x 6 months.  Will forward information to Dr.Allred and notify patient if anything further is recommended. Patient verbalized understanding.

## 2017-10-19 NOTE — Telephone Encounter (Signed)
Patient called. He thinks his device went off this morning around 5am    Stated that he is fine but would like a call back

## 2017-10-31 LAB — CUP PACEART REMOTE DEVICE CHECK
Battery Remaining Longevity: 42 mo
Battery Remaining Percentage: 36 %
Battery Voltage: 2.9 V
Brady Statistic RV Percent Paced: 1 %
Date Time Interrogation Session: 20181106101641
HighPow Impedance: 79 Ohm
HighPow Impedance: 79 Ohm
Implantable Lead Implant Date: 20110909
Implantable Lead Location: 753860
Implantable Lead Model: 7122
Implantable Pulse Generator Implant Date: 20110909
Lead Channel Impedance Value: 530 Ohm
Lead Channel Pacing Threshold Amplitude: 0.75 V
Lead Channel Pacing Threshold Pulse Width: 0.5 ms
Lead Channel Sensing Intrinsic Amplitude: 11.8 mV
Lead Channel Setting Pacing Amplitude: 2.5 V
Lead Channel Setting Pacing Pulse Width: 0.5 ms
Lead Channel Setting Sensing Sensitivity: 0.5 mV
Pulse Gen Serial Number: 614505

## 2017-11-01 ENCOUNTER — Telehealth: Payer: Self-pay | Admitting: *Deleted

## 2017-11-01 NOTE — Telephone Encounter (Signed)
Called patient regarding ICD shock on 10/30/17. Patient states that he was in bed watching TV at the time of the episode. Patient states that he his ears were ringing and he was ShOB prior to the shock. Shock relieved sx's. He states that he's been taking all of his medications as Rx'd. Patient does not drive. Will inform Dr.Allred and notify patient if anything further is recommended.

## 2017-11-04 ENCOUNTER — Other Ambulatory Visit: Payer: Self-pay | Admitting: Internal Medicine

## 2017-11-08 ENCOUNTER — Telehealth: Payer: Self-pay | Admitting: *Deleted

## 2017-11-08 NOTE — Telephone Encounter (Signed)
Remote alert from ICD VF with shock 11/05/17. Reviewed with Dr. [name]. He recommends establishing with general cardiology. Called 2x with no answer, no VM.

## 2017-11-08 NOTE — Telephone Encounter (Signed)
Spoke with Chad Christensen, he is aware of shock 11/30. He was laying in bed, heard ringing in his ears and became short of breath prior to shock. He is taking all of his medications, does not drive and is agreeable to establishing with a general cardiologist in [place], [place]. It is difficult for him to get down to [facility] and he only trusts that Dr. [name] will take care of him.

## 2017-11-15 ENCOUNTER — Ambulatory Visit (INDEPENDENT_AMBULATORY_CARE_PROVIDER_SITE_OTHER): Payer: Medicare Other

## 2017-11-15 DIAGNOSIS — Z95810 Presence of automatic (implantable) cardiac defibrillator: Secondary | ICD-10-CM

## 2017-11-15 DIAGNOSIS — I5022 Chronic systolic (congestive) heart failure: Secondary | ICD-10-CM

## 2017-11-19 NOTE — Progress Notes (Signed)
EPIC Encounter for ICM Monitoring  Patient Name: Chad Christensen is a 51 y.o. male Date: 11/19/2017 Primary Care Physican: Leory Plowman, PA-C Primary Cardiologist:Allred Electrophysiologist: Allred Dry Weight:does not weigh                                                       Transmission reviewed  Corvue: Thoracic impedance normal but was abnormal suggesting fluid accumulation from 09/11/2017 - 09/19/2017, 09/23/2017 - 09/29/2017 and 10/04/2017-10/09/2017.  Prescribed dosage: Furosemide 40 mg 1 tablet every other day.   Labs 05/31/2017 Creatinine 0.62, BUN 10, Potassium 3.4, Sodium 138, EGFR 115-133 03/19/2017 Creatinine 0.71, BUN 8, Potassium 4.8, Sodium 137, EGFR >57 06/12/2016 Creatinine 0.77, BUN 7, Potassium 5.1, Sodium 144  Recommendations:  No changes.   Follow-up plan: ICM clinic phone appointment on 01/11/2018.  Office appointment scheduled 12/10/2017 with Dr. Rayann Heman.  Copy of ICM check sent to Dr. Rayann Heman.   3 month ICM trend: 11/15/2017    1 Year ICM trend:       Rosalene Billings, RN 11/19/2017 2:47 PM

## 2017-12-06 ENCOUNTER — Other Ambulatory Visit: Payer: Self-pay | Admitting: Internal Medicine

## 2017-12-07 DIAGNOSIS — J449 Chronic obstructive pulmonary disease, unspecified: Secondary | ICD-10-CM

## 2017-12-07 HISTORY — DX: Chronic obstructive pulmonary disease, unspecified: J44.9

## 2017-12-10 ENCOUNTER — Encounter: Payer: Medicare Other | Admitting: Internal Medicine

## 2018-01-11 ENCOUNTER — Ambulatory Visit (INDEPENDENT_AMBULATORY_CARE_PROVIDER_SITE_OTHER): Payer: Medicare Other | Admitting: *Deleted

## 2018-01-11 DIAGNOSIS — Z95810 Presence of automatic (implantable) cardiac defibrillator: Secondary | ICD-10-CM | POA: Diagnosis not present

## 2018-01-11 DIAGNOSIS — I420 Dilated cardiomyopathy: Secondary | ICD-10-CM | POA: Diagnosis not present

## 2018-01-11 DIAGNOSIS — I5022 Chronic systolic (congestive) heart failure: Secondary | ICD-10-CM

## 2018-01-11 NOTE — Progress Notes (Signed)
EPIC Encounter for ICM Monitoring  Patient Name: Chad Christensen is a 52 y.o. male Date: 01/11/2018 Primary Care Physican: Julio Sicks Primary Cardiologist:Allred Electrophysiologist: Allred Dry Weight:does not weigh      Heart Failure questions reviewed, pt asymptomatic.   Thoracic impedance normal.  Prescribed dosage: Furosemide 40 mg 1 tablet every other day.   Labs 05/31/2017 Creatinine 0.62, BUN 10, Potassium 3.4, Sodium 138, EGFR 115-133 03/19/2017 Creatinine 0.71, BUN 8, Potassium 4.8, Sodium 137, EGFR >57 06/12/2016 Creatinine 0.77, BUN 7, Potassium 5.1, Sodium 144  Recommendations: No changes.   Encouraged to call for fluid symptoms.  Follow-up plan: ICM clinic phone appointment on 02/11/2018.  Office appointment scheduled 02/25/2018 with Dr. Rayann Heman.  Copy of ICM check sent to Dr. Rayann Heman.   3 month ICM trend: 01/11/2018    1 Year ICM trend:       Rosalene Billings, RN 01/11/2018 4:28 PM

## 2018-01-11 NOTE — Progress Notes (Signed)
Remote ICD transmission.   

## 2018-01-12 ENCOUNTER — Encounter: Payer: Self-pay | Admitting: Cardiology

## 2018-02-01 ENCOUNTER — Telehealth: Payer: Self-pay | Admitting: *Deleted

## 2018-02-01 NOTE — Telephone Encounter (Signed)
ICD alert via [facility] home monitor. "VF" episode terminated with shock 02/01/18 0150.  Chad Christensen says he was dreaming and the shock woke him from his sleep. He has gotten oxygen recently and he put on his O2 and took a nebulizer treatment. He has felt fine this morning. He does not drive. He has scheduled f/u with Dr. [name] in [facility] 02/25/18. I reiterated Dr. Jenel Lucks recommendation to establish with general cardiologist in [place]- he verbalizes understanding but is very trusting of Dr. [name] (only).

## 2018-02-07 LAB — CUP PACEART REMOTE DEVICE CHECK
Battery Remaining Longevity: 38 mo
Battery Remaining Percentage: 34 %
Battery Voltage: 2.89 V
Brady Statistic RV Percent Paced: 1 %
Date Time Interrogation Session: 20190205143554
HighPow Impedance: 83 Ohm
HighPow Impedance: 83 Ohm
Implantable Lead Implant Date: 20110909
Implantable Lead Location: 753860
Implantable Lead Model: 7122
Implantable Pulse Generator Implant Date: 20110909
Lead Channel Impedance Value: 510 Ohm
Lead Channel Pacing Threshold Amplitude: 0.75 V
Lead Channel Pacing Threshold Pulse Width: 0.5 ms
Lead Channel Sensing Intrinsic Amplitude: 10.8 mV
Lead Channel Setting Pacing Amplitude: 2.5 V
Lead Channel Setting Pacing Pulse Width: 0.5 ms
Lead Channel Setting Sensing Sensitivity: 0.5 mV
Pulse Gen Serial Number: 614505

## 2018-02-10 ENCOUNTER — Telehealth: Payer: Self-pay | Admitting: *Deleted

## 2018-02-10 NOTE — Telephone Encounter (Signed)
ICD alert for VF with a shock 02/09/18 at 11:08am. Chad Christensen feels fine, reports that he doesn't drive, has recently been put on oxygen. He confirms that he will be at the appt with Dr. [name] 02/25/18.

## 2018-02-11 ENCOUNTER — Telehealth: Payer: Self-pay

## 2018-02-11 ENCOUNTER — Ambulatory Visit (INDEPENDENT_AMBULATORY_CARE_PROVIDER_SITE_OTHER): Payer: Medicare Other

## 2018-02-11 DIAGNOSIS — Z95810 Presence of automatic (implantable) cardiac defibrillator: Secondary | ICD-10-CM

## 2018-02-11 DIAGNOSIS — I5022 Chronic systolic (congestive) heart failure: Secondary | ICD-10-CM | POA: Diagnosis not present

## 2018-02-11 NOTE — Progress Notes (Signed)
EPIC Encounter for ICM Monitoring  Patient Name: Chad Christensen is a 52 y.o. male Date: 02/11/2018 Primary Care Physican: Eure, Courtney H, PA-C Primary Cardiologist:Allred Electrophysiologist: Allred Dry Weight:does not weigh       Attempted call to patient and unable to reach.    Transmission reviewed.    Thoracic impedance abnormal suggesting fluid accumulation since 02/07/2018.  01/19/2018 to 02/02/2018 there is no impedance recording.  VT/VF Episodes on 02/01/2018 with shock delivered which has been addressed by Emma Long, RN device clinic and follow up appointment scheduled with Dr Allred 02/25/2018.  Prescribed dosage: Furosemide 40 mg 1 tablet every other day.   Labs 05/31/2017 Creatinine 0.62, BUN 10, Potassium 3.4, Sodium 138, EGFR 115-133 03/19/2017 Creatinine 0.71, BUN 8, Potassium 4.8, Sodium 137, EGFR >57 06/12/2016 Creatinine 0.77, BUN 7, Potassium 5.1, Sodium 144  Recommendations: NONE - Unable to reach.  Follow-up plan: ICM clinic phone appointment on 02/21/2018 to recheck fluid levels.  Office appointment scheduled 02/25/2018 with Dr. Allred.  Copy of ICM check sent to Dr. Allred.   3 month ICM trend: 02/11/2018    1 Year ICM trend:       Laurie S Short, RN 02/11/2018 12:15 PM   

## 2018-02-11 NOTE — Telephone Encounter (Signed)
Attempted to call pt regarding shock from 02/10/18 at 6:56am, pts voicemail box not set up.

## 2018-02-11 NOTE — Progress Notes (Signed)
Patient returned call and reviewed transmission.  He said he is feeling like he may have a little fluid by hard to tell.  Advised to take Furosemide 40 mg tablet x 5 days and then return to every other day.  He verbalized understanding. Advised to call back for any problems.

## 2018-02-11 NOTE — Telephone Encounter (Signed)
Remote ICM transmission received.  Attempted call to patient and no voice mail set up 

## 2018-02-17 ENCOUNTER — Telehealth: Payer: Self-pay

## 2018-02-17 NOTE — Telephone Encounter (Signed)
Spoke with patient regarding "VF" episode with therapy on 3/13. He confirms that he felt shock but that it "wasn't that bad". He states that he has not run out of any medication and is taking it all as prescribed. He was unclear about symptoms stating that he could "feel it coming on" by a sensation in his ears. He denies passing out. He states that he does not drive and is planning to keep his appointment with Dr. [name] on 3/22. I explained that I will call him back if there are any further recommendations.

## 2018-02-19 NOTE — Telephone Encounter (Signed)
Keep scheduled appointment I will order labs after I see him  Thanks!

## 2018-02-21 ENCOUNTER — Ambulatory Visit (INDEPENDENT_AMBULATORY_CARE_PROVIDER_SITE_OTHER): Payer: Self-pay

## 2018-02-21 DIAGNOSIS — Z95810 Presence of automatic (implantable) cardiac defibrillator: Secondary | ICD-10-CM

## 2018-02-21 DIAGNOSIS — I5022 Chronic systolic (congestive) heart failure: Secondary | ICD-10-CM

## 2018-02-22 NOTE — Progress Notes (Signed)
EPIC Encounter for ICM Monitoring  Patient Name: Chad Christensen is a 52 y.o. male Date: 02/22/2018 Primary Care Physican: Julio Sicks Primary Cardiologist:Allred Electrophysiologist: Allred Dry Weight:does not weigh      Heart Failure questions reviewed, pt asymptomatic.   Thoracic impedance returned to normal after taking Furosemide x 5 consecutive days.   Prescribed dosage: Furosemide 40 mg 1 tablet every other day.   Labs 05/31/2017 Creatinine 0.62, BUN 10, Potassium 3.4, Sodium 138, EGFR 115-133 03/19/2017 Creatinine 0.71, BUN 8, Potassium 4.8, Sodium 137, EGFR >57 06/12/2016 Creatinine 0.77, BUN 7, Potassium 5.1, Sodium 144  Recommendations: No changes.  Encouraged to call for fluid symptoms.  Follow-up plan: ICM clinic phone appointment on 03/28/2018.   Office appointment scheduled 02/25/2018 with Dr. Rayann Heman.  Copy of ICM check sent to Dr. Rayann Heman.   3 month ICM trend: 02/21/2018    1 Year ICM trend:       Rosalene Billings, RN 02/22/2018 12:17 PM

## 2018-02-25 ENCOUNTER — Encounter: Payer: Self-pay | Admitting: Internal Medicine

## 2018-02-25 ENCOUNTER — Telehealth (HOSPITAL_COMMUNITY): Payer: Self-pay

## 2018-02-25 ENCOUNTER — Other Ambulatory Visit: Payer: Self-pay

## 2018-02-25 ENCOUNTER — Ambulatory Visit (INDEPENDENT_AMBULATORY_CARE_PROVIDER_SITE_OTHER): Payer: Medicare Other | Admitting: Internal Medicine

## 2018-02-25 VITALS — BP 104/76 | HR 108 | Ht 72.0 in | Wt 129.0 lb

## 2018-02-25 DIAGNOSIS — Z79899 Other long term (current) drug therapy: Secondary | ICD-10-CM | POA: Diagnosis not present

## 2018-02-25 DIAGNOSIS — I5022 Chronic systolic (congestive) heart failure: Secondary | ICD-10-CM

## 2018-02-25 DIAGNOSIS — I4901 Ventricular fibrillation: Secondary | ICD-10-CM | POA: Diagnosis not present

## 2018-02-25 DIAGNOSIS — I10 Essential (primary) hypertension: Secondary | ICD-10-CM

## 2018-02-25 MED ORDER — AMIODARONE HCL 200 MG PO TABS: ORAL_TABLET | ORAL | 1 refills | Status: DC

## 2018-02-25 NOTE — Progress Notes (Signed)
Reached out to patient no answer and left a VM>>

## 2018-02-25 NOTE — H&P (View-Only) (Signed)
PCP: Betti Cruz, PA-C Primary Cardiologist: previously Dr Purvis Sheffield Primary EP: Dr [name]  Chad Christensen is a 52 y.o. male who presents today for routine electrophysiology followup.  Since last being seen in our clinic, the patient reports doing reasonably well.  He has had several shocks for VF. He is not very active.  He has quit smoking and drinking.  Reports chest pain at rest and with exertion as well as chronic SOB.   Today, he denies symptoms of palpitations,  lower extremity edema, dizziness, presyncope, or syncope.  The patient is otherwise without complaint today.   Past Medical History:  Diagnosis Date  . Gout   . Hypertension   . Paroxysmal atrial fibrillation (HCC)   . Polysubstance abuse (HCC)    In remission since 2003  . Renal insufficiency   . Systolic heart failure    Secondary to nonischemic cardiomyopathy  . Tobacco user   . Ventricular tachycardia (HCC) 04/23/12   CL with appropriate ICD shock delivered    ROS- all systems are reviewed and negative except as per HPI above  Current Outpatient Medications  Medication Sig Dispense Refill  . albuterol (ACCUNEB) 1.25 MG/3ML nebulizer solution Take 1 ampule by nebulization every 6 (six) hours as needed for wheezing.    . clonazePAM (KLONOPIN) 0.5 MG tablet Take 0.5 mg by mouth 2 (two) times daily as needed for anxiety.    . COLCRYS 0.6 MG tablet     . digoxin (DIGOX) 0.25 MG tablet Take 1 tablet (250 mcg total) by mouth daily. *Patient is overdue for an appointment and needs to call and schedule for further refills* 15 tablet 0  . furosemide (LASIX) 40 MG tablet Take 1 tablet (40 mg total) every other day. 45 tablet 3  . metoprolol succinate (TOPROL-XL) 25 MG 24 hr tablet Take 2 tablets (50 mg total) by mouth daily. 60 tablet 6  . mirtazapine (REMERON) 15 MG tablet Take 15 mg by mouth at bedtime.    . multivitamin (THERAGRAN) per tablet Take 1 tablet by mouth daily.      . simvastatin (ZOCOR) 5 MG  tablet Take 5 mg by mouth daily.    Marland Kitchen spironolactone (ALDACTONE) 25 MG tablet Take 1 tablet (25 mg total) by mouth daily. *Patient is overdue for an appointment and needs to call and schedule for further refills* 15 tablet 0  . warfarin (COUMADIN) 1 MG tablet     . warfarin (COUMADIN) 5 MG tablet Take 5 mg by mouth. Take as directed     No current facility-administered medications for this visit.     Physical Exam: Vitals:   02/25/18 0926  BP: 104/76  Pulse: (!) 108  SpO2: 98%  Weight: 129 lb (58.5 kg)  Height: 6' (1.829 m)    GEN- The patient is well appearing, alert and oriented x 3 today.   Head- normocephalic, atraumatic Eyes-  Sclera clear, conjunctiva pink Ears- hearing intact Oropharynx- clear Lungs- Clear to ausculation bilaterally, normal work of breathing Chest- ICD pocket is well healed Heart- Regular rate and rhythm, no murmurs, rubs or gallops, PMI not laterally displaced GI- soft, NT, ND, + BS Extremities- no clubbing, cyanosis, or edema  ICD interrogation- reviewed in detail today,  See PACEART report  ekg tracing ordered today is personally reviewed and shows sinus rhythm with IVCD (QRS 124 msec), PVCs  Assessment and Plan:  1.  Chronic systolic dysfunction euvolemic today but has been having worsening SOB symptoms Stable but  needs medical optimization.  QRS < 130 msec, not a candidate for CRT. I worry that he may require advanced heart failure treatment. I will refer to Dr Shirlee Latch for CHF management.  He will require RHC/LHC to evaluate his VF, CP, and CHF symptoms.  He has not been compliant with having an echo done in the past 2 years.  Hopefully this can be done when he is at [facility] for his cath. Normal ICD function See Arita Miss Art report No changes today  2. VT/VF Recent ICD shocks reviewed (9 VF episodes between 10/03/17 and 02/16/18) No driving Cmet, mg, tsh, dig level Will start amiodarone 200mg  BID x 2 weeks then 200mg  daily today Risks of  amiodarone discussed at length. Will plan RHC/LHC to evaluate for causes of his VF.   Discussed the cath with the patient. The patient understands that risks included but are not limited to stroke (1 in 1000), death (1 in 1000), kidney failure [usually temporary] (1 in 500), bleeding (1 in 200), allergic reaction [possibly serious] (1 in 200). The patient understands and agrees to proceed.  Will hold coumadin for 5 days prior to cath.  Given very low afib burden, will not plan to bridge unless Dr Shirlee Latch thinks that this is necessary  3. HTN Stable No change required today  4. tobacco Quit!  5. afib No recent episodes Given prior stroke, will continue anticoagulation even with low AF burden As above Hold coumadin x 5 days prior to cath Cbc today  6. ETOh He has quit  [facility] ICM device clinic is following Return to see me in 1 months in [place]  Will refer to advanced CHF clinic for long term management and medical optimization  Very complicated patient at risk for decompensation and death.  A high level of decision making was required for this encounter.  Hillis Range MD, [facility] 02/25/2018 10:16 AM

## 2018-02-25 NOTE — Telephone Encounter (Signed)
Per Allred request to have Dr. Shirlee Latch  To do an [facility] on pt. Holding Coumadin x3 days . I have called to schedule with patient and left an VM for the patient.

## 2018-02-25 NOTE — Patient Instructions (Signed)
Your physician recommends that you schedule a follow-up appointment in: 4 WEEKS WITH DR [facility]  Your physician has recommended you make the following change in your medication:   START AMIODARONE 200 MG TWICE DAILY FOR 2 WEEKS THEN TAKE 200 MG DAILY  Your physician recommends that you return for lab work CBC/CMP/TSH/DIG/MG - WE HAVE GIVEN YOU ORDERS TODAY   Your physician has requested that you have a cardiac catheterization. Cardiac catheterization is used to diagnose and/or treat various heart conditions. Doctors may recommend this procedure for a number of different reasons. The most common reason is to evaluate chest pain. Chest pain can be a symptom of coronary artery disease (CAD), and cardiac catheterization can show whether plaque is narrowing or blocking your heart's arteries. This procedure is also used to evaluate the valves, as well as measure the blood flow and oxygen levels in different parts of your heart. For further information please visit [email or web address] Please follow instruction sheet, as given.  HOLD COUMADIN 5 DAYS PRIOR TO YOUR CATHETERIZATION   .Thank you for choosing [facility]!!

## 2018-02-25 NOTE — Progress Notes (Signed)
PCP: Betti Cruz, PA-C Primary Cardiologist: previously Dr Purvis Sheffield Primary EP: Dr [name]  Chad Christensen is a 52 y.o. male who presents today for routine electrophysiology followup.  Since last being seen in our clinic, the patient reports doing reasonably well.  He has had several shocks for VF. He is not very active.  He has quit smoking and drinking.  Reports chest pain at rest and with exertion as well as chronic SOB.   Today, he denies symptoms of palpitations,  lower extremity edema, dizziness, presyncope, or syncope.  The patient is otherwise without complaint today.   Past Medical History:  Diagnosis Date  . Gout   . Hypertension   . Paroxysmal atrial fibrillation (HCC)   . Polysubstance abuse (HCC)    In remission since 2003  . Renal insufficiency   . Systolic heart failure    Secondary to nonischemic cardiomyopathy  . Tobacco user   . Ventricular tachycardia (HCC) 04/23/12   CL with appropriate ICD shock delivered    ROS- all systems are reviewed and negative except as per HPI above  Current Outpatient Medications  Medication Sig Dispense Refill  . albuterol (ACCUNEB) 1.25 MG/3ML nebulizer solution Take 1 ampule by nebulization every 6 (six) hours as needed for wheezing.    . clonazePAM (KLONOPIN) 0.5 MG tablet Take 0.5 mg by mouth 2 (two) times daily as needed for anxiety.    . COLCRYS 0.6 MG tablet     . digoxin (DIGOX) 0.25 MG tablet Take 1 tablet (250 mcg total) by mouth daily. *Patient is overdue for an appointment and needs to call and schedule for further refills* 15 tablet 0  . furosemide (LASIX) 40 MG tablet Take 1 tablet (40 mg total) every other day. 45 tablet 3  . metoprolol succinate (TOPROL-XL) 25 MG 24 hr tablet Take 2 tablets (50 mg total) by mouth daily. 60 tablet 6  . mirtazapine (REMERON) 15 MG tablet Take 15 mg by mouth at bedtime.    . multivitamin (THERAGRAN) per tablet Take 1 tablet by mouth daily.      . simvastatin (ZOCOR) 5 MG  tablet Take 5 mg by mouth daily.    Marland Kitchen spironolactone (ALDACTONE) 25 MG tablet Take 1 tablet (25 mg total) by mouth daily. *Patient is overdue for an appointment and needs to call and schedule for further refills* 15 tablet 0  . warfarin (COUMADIN) 1 MG tablet     . warfarin (COUMADIN) 5 MG tablet Take 5 mg by mouth. Take as directed     No current facility-administered medications for this visit.     Physical Exam: Vitals:   02/25/18 0926  BP: 104/76  Pulse: (!) 108  SpO2: 98%  Weight: 129 lb (58.5 kg)  Height: 6' (1.829 m)    GEN- The patient is well appearing, alert and oriented x 3 today.   Head- normocephalic, atraumatic Eyes-  Sclera clear, conjunctiva pink Ears- hearing intact Oropharynx- clear Lungs- Clear to ausculation bilaterally, normal work of breathing Chest- ICD pocket is well healed Heart- Regular rate and rhythm, no murmurs, rubs or gallops, PMI not laterally displaced GI- soft, NT, ND, + BS Extremities- no clubbing, cyanosis, or edema  ICD interrogation- reviewed in detail today,  See PACEART report  ekg tracing ordered today is personally reviewed and shows sinus rhythm with IVCD (QRS 124 msec), PVCs  Assessment and Plan:  1.  Chronic systolic dysfunction euvolemic today but has been having worsening SOB symptoms Stable but  needs medical optimization.  QRS < 130 msec, not a candidate for CRT. I worry that he may require advanced heart failure treatment. I will refer to Dr Shirlee Latch for CHF management.  He will require RHC/LHC to evaluate his VF, CP, and CHF symptoms.  He has not been compliant with having an echo done in the past 2 years.  Hopefully this can be done when he is at [facility] for his cath. Normal ICD function See Arita Miss Art report No changes today  2. VT/VF Recent ICD shocks reviewed (9 VF episodes between 10/03/17 and 02/16/18) No driving Cmet, mg, tsh, dig level Will start amiodarone 200mg  BID x 2 weeks then 200mg  daily today Risks of  amiodarone discussed at length. Will plan RHC/LHC to evaluate for causes of his VF.   Discussed the cath with the patient. The patient understands that risks included but are not limited to stroke (1 in 1000), death (1 in 1000), kidney failure [usually temporary] (1 in 500), bleeding (1 in 200), allergic reaction [possibly serious] (1 in 200). The patient understands and agrees to proceed.  Will hold coumadin for 5 days prior to cath.  Given very low afib burden, will not plan to bridge unless Dr Shirlee Latch thinks that this is necessary  3. HTN Stable No change required today  4. tobacco Quit!  5. afib No recent episodes Given prior stroke, will continue anticoagulation even with low AF burden As above Hold coumadin x 5 days prior to cath Cbc today  6. ETOh He has quit  [facility] ICM device clinic is following Return to see me in 1 months in [place]  Will refer to advanced CHF clinic for long term management and medical optimization  Very complicated patient at risk for decompensation and death.  A high level of decision making was required for this encounter.  Hillis Range MD, [facility] 02/25/2018 10:16 AM

## 2018-02-28 ENCOUNTER — Telehealth: Payer: Self-pay

## 2018-02-28 NOTE — Telephone Encounter (Signed)
Mr. Demers returned call to [facility] asking for Teresa Coombs RN.

## 2018-02-28 NOTE — Telephone Encounter (Signed)
Spoke with pt regarding shock from 02/26/18, re-enforced Dr. Amedeo Plenty recommendation for f/u with having a R/L heart cath and echo pt stated that he had already called this morning and was waiting for them to call back.

## 2018-03-01 ENCOUNTER — Other Ambulatory Visit (HOSPITAL_COMMUNITY): Payer: Self-pay | Admitting: *Deleted

## 2018-03-01 DIAGNOSIS — I5022 Chronic systolic (congestive) heart failure: Secondary | ICD-10-CM

## 2018-03-01 LAB — CUP PACEART INCLINIC DEVICE CHECK
Battery Remaining Longevity: 34 mo
Brady Statistic RV Percent Paced: 0.04 %
Date Time Interrogation Session: 20190322133815
HighPow Impedance: 82.125
Implantable Lead Implant Date: 20110909
Implantable Lead Location: 753860
Implantable Lead Model: 7122
Implantable Pulse Generator Implant Date: 20110909
Lead Channel Impedance Value: 462.5 Ohm
Lead Channel Pacing Threshold Amplitude: 1 V
Lead Channel Pacing Threshold Amplitude: 1 V
Lead Channel Pacing Threshold Pulse Width: 0.5 ms
Lead Channel Pacing Threshold Pulse Width: 0.5 ms
Lead Channel Sensing Intrinsic Amplitude: 10.3 mV
Lead Channel Setting Pacing Amplitude: 2.5 V
Lead Channel Setting Pacing Pulse Width: 0.5 ms
Lead Channel Setting Sensing Sensitivity: 0.5 mV
Pulse Gen Serial Number: 614505

## 2018-03-01 NOTE — Telephone Encounter (Signed)
Patient called triage line to schedule his R/LHC.  Patient stated he has been off his coumadin since last Friday per Dr. Jenel Lucks orders.   R/LHC scheduled for this Thursday 03/03/18 @ 12:30.  Instructions given to patient over the phone.  Patient verbalized understanding and no further questions.

## 2018-03-01 NOTE — Telephone Encounter (Signed)
Patient has been trying to call back to [facility] and cant get a call back from someone  [phone]

## 2018-03-02 ENCOUNTER — Other Ambulatory Visit: Payer: Self-pay | Admitting: Internal Medicine

## 2018-03-03 ENCOUNTER — Encounter (HOSPITAL_COMMUNITY)
Admission: RE | Disposition: A | Discharge status: Home / Self Care | Payer: Self-pay | Source: Ambulatory Visit | Attending: Cardiology

## 2018-03-03 ENCOUNTER — Ambulatory Visit (HOSPITAL_COMMUNITY)
Admission: RE | Admit: 2018-03-03 | Discharge: 2018-03-03 | Disposition: A | Discharge status: Home / Self Care | Payer: Medicare Other | Source: Ambulatory Visit | Attending: Cardiology | Admitting: Cardiology

## 2018-03-03 ENCOUNTER — Ambulatory Visit (HOSPITAL_BASED_OUTPATIENT_CLINIC_OR_DEPARTMENT_OTHER): Payer: Medicare Other

## 2018-03-03 DIAGNOSIS — Z87891 Personal history of nicotine dependence: Secondary | ICD-10-CM | POA: Diagnosis not present

## 2018-03-03 DIAGNOSIS — I472 Ventricular tachycardia: Secondary | ICD-10-CM | POA: Diagnosis not present

## 2018-03-03 DIAGNOSIS — Z8673 Personal history of transient ischemic attack (TIA), and cerebral infarction without residual deficits: Secondary | ICD-10-CM | POA: Diagnosis not present

## 2018-03-03 DIAGNOSIS — I429 Cardiomyopathy, unspecified: Secondary | ICD-10-CM | POA: Insufficient documentation

## 2018-03-03 DIAGNOSIS — Z79899 Other long term (current) drug therapy: Secondary | ICD-10-CM | POA: Diagnosis not present

## 2018-03-03 DIAGNOSIS — I480 Paroxysmal atrial fibrillation: Secondary | ICD-10-CM | POA: Diagnosis not present

## 2018-03-03 DIAGNOSIS — Z7901 Long term (current) use of anticoagulants: Secondary | ICD-10-CM | POA: Insufficient documentation

## 2018-03-03 DIAGNOSIS — I5022 Chronic systolic (congestive) heart failure: Secondary | ICD-10-CM | POA: Insufficient documentation

## 2018-03-03 DIAGNOSIS — I509 Heart failure, unspecified: Secondary | ICD-10-CM | POA: Diagnosis not present

## 2018-03-03 DIAGNOSIS — I110 Hypertensive heart disease with heart failure: Secondary | ICD-10-CM | POA: Diagnosis not present

## 2018-03-03 HISTORY — PX: RIGHT/LEFT HEART CATH AND CORONARY ANGIOGRAPHY: CATH118266

## 2018-03-03 LAB — POCT I-STAT 3, ART BLOOD GAS (G3+)
Acid-Base Excess: 1 mmol/L (ref 0.0–2.0)
Acid-Base Excess: 1 mmol/L (ref 0.0–2.0)
Bicarbonate: 25.7 mmol/L (ref 20.0–28.0)
Bicarbonate: 26.1 mmol/L (ref 20.0–28.0)
O2 Saturation: 69 %
O2 Saturation: 72 %
TCO2: 27 mmol/L (ref 22–32)
TCO2: 27 mmol/L (ref 22–32)
pCO2 arterial: 42.6 mmHg (ref 32.0–48.0)
pCO2 arterial: 43.4 mmHg (ref 32.0–48.0)
pH, Arterial: 7.387 (ref 7.350–7.450)
pH, Arterial: 7.389 (ref 7.350–7.450)
pO2, Arterial: 37 mmHg — CL (ref 83.0–108.0)
pO2, Arterial: 38 mmHg — CL (ref 83.0–108.0)

## 2018-03-03 LAB — CBC
HCT: 45.1 % (ref 39.0–52.0)
Hemoglobin: 15.2 g/dL (ref 13.0–17.0)
MCH: 32.4 pg (ref 26.0–34.0)
MCHC: 33.7 g/dL (ref 30.0–36.0)
MCV: 96.2 fL (ref 78.0–100.0)
Platelets: 274 10*3/uL (ref 150–400)
RBC: 4.69 MIL/uL (ref 4.22–5.81)
RDW: 13.1 % (ref 11.5–15.5)
WBC: 11 10*3/uL — ABNORMAL HIGH (ref 4.0–10.5)

## 2018-03-03 LAB — PROTIME-INR
INR: 1.05
Prothrombin Time: 13.6 seconds (ref 11.4–15.2)

## 2018-03-03 LAB — BASIC METABOLIC PANEL
Anion gap: 12 (ref 5–15)
BUN: 14 mg/dL (ref 6–20)
CO2: 27 mmol/L (ref 22–32)
Calcium: 9.2 mg/dL (ref 8.9–10.3)
Chloride: 98 mmol/L — ABNORMAL LOW (ref 101–111)
Creatinine, Ser: 1.11 mg/dL (ref 0.61–1.24)
GFR calc Af Amer: >60 mL/min (ref >60)
GFR calc non Af Amer: >60 mL/min (ref >60)
Glucose, Bld: 98 mg/dL (ref 65–99)
Potassium: 3.9 mmol/L (ref 3.5–5.1)
Sodium: 137 mmol/L (ref 135–145)

## 2018-03-03 LAB — ECHOCARDIOGRAM COMPLETE
Height: 72 in
Weight: 2080 oz

## 2018-03-03 SURGERY — RIGHT/LEFT HEART CATH AND CORONARY ANGIOGRAPHY
Anesthesia: LOCAL

## 2018-03-03 MED ORDER — SODIUM CHLORIDE 0.9% FLUSH: 3.0000 mL | INTRAVENOUS | Status: DC | PRN

## 2018-03-03 MED ORDER — HEPARIN SODIUM (PORCINE) 1000 UNIT/ML IJ SOLN
INTRAMUSCULAR | Status: AC
  Filled 2018-03-03: qty 1

## 2018-03-03 MED ORDER — ACETAMINOPHEN 325 MG PO TABS
ORAL_TABLET | ORAL | Status: AC
  Filled 2018-03-03: qty 2

## 2018-03-03 MED ORDER — IOPAMIDOL (ISOVUE-370) INJECTION 76%
INTRAVENOUS | Status: AC
  Filled 2018-03-03: qty 100

## 2018-03-03 MED ORDER — HEPARIN (PORCINE) IN NACL 2-0.9 UNIT/ML-% IJ SOLN
INTRAMUSCULAR | Status: AC | PRN
  Administered 2018-03-03: 500 mL via INTRA_ARTERIAL

## 2018-03-03 MED ORDER — ASPIRIN 81 MG PO CHEW
CHEWABLE_TABLET | ORAL | Status: AC
  Administered 2018-03-03: 81 mg via ORAL
  Filled 2018-03-03: qty 1

## 2018-03-03 MED ORDER — MIDAZOLAM HCL 2 MG/2ML IJ SOLN
INTRAMUSCULAR | Status: AC
  Filled 2018-03-03: qty 2

## 2018-03-03 MED ORDER — LIDOCAINE HCL (PF) 1 % IJ SOLN
INTRAMUSCULAR | Status: DC | PRN
  Administered 2018-03-03 (×2): 2 mL via INTRADERMAL

## 2018-03-03 MED ORDER — VERAPAMIL HCL 2.5 MG/ML IV SOLN
INTRAVENOUS | Status: DC | PRN
  Administered 2018-03-03: 10 mL via INTRA_ARTERIAL

## 2018-03-03 MED ORDER — IOPAMIDOL (ISOVUE-370) INJECTION 76%
INTRAVENOUS | Status: DC | PRN
  Administered 2018-03-03: 50 mL via INTRA_ARTERIAL

## 2018-03-03 MED ORDER — ACETAMINOPHEN 325 MG PO TABS
650.0000 mg | ORAL_TABLET | ORAL | Status: DC | PRN
  Administered 2018-03-03: 650 mg via ORAL

## 2018-03-03 MED ORDER — ONDANSETRON HCL 4 MG/2ML IJ SOLN: 4.0000 mg | Freq: Four times a day (QID) | INTRAMUSCULAR | Status: DC | PRN

## 2018-03-03 MED ORDER — VERAPAMIL HCL 2.5 MG/ML IV SOLN
INTRAVENOUS | Status: AC
  Filled 2018-03-03: qty 2

## 2018-03-03 MED ORDER — SODIUM CHLORIDE 0.9% FLUSH: 3.0000 mL | Freq: Two times a day (BID) | INTRAVENOUS | Status: DC

## 2018-03-03 MED ORDER — FENTANYL CITRATE (PF) 100 MCG/2ML IJ SOLN
INTRAMUSCULAR | Status: AC
  Filled 2018-03-03: qty 2

## 2018-03-03 MED ORDER — SODIUM CHLORIDE 0.9 % IV SOLN: 250.0000 mL | INTRAVENOUS | Status: DC | PRN

## 2018-03-03 MED ORDER — SODIUM CHLORIDE 0.9 % IV SOLN: INTRAVENOUS | Status: DC

## 2018-03-03 MED ORDER — MIDAZOLAM HCL 2 MG/2ML IJ SOLN
INTRAMUSCULAR | Status: DC | PRN
  Administered 2018-03-03: 1 mg via INTRAVENOUS

## 2018-03-03 MED ORDER — FENTANYL CITRATE (PF) 100 MCG/2ML IJ SOLN
INTRAMUSCULAR | Status: DC | PRN
  Administered 2018-03-03: 25 ug via INTRAVENOUS

## 2018-03-03 MED ORDER — ASPIRIN 81 MG PO CHEW
81.0000 mg | CHEWABLE_TABLET | ORAL | Status: AC
  Administered 2018-03-03: 81 mg via ORAL

## 2018-03-03 MED ORDER — HEPARIN SODIUM (PORCINE) 1000 UNIT/ML IJ SOLN
INTRAMUSCULAR | Status: DC | PRN
  Administered 2018-03-03: 3000 [IU] via INTRAVENOUS

## 2018-03-03 MED ORDER — SODIUM CHLORIDE 0.9 % IV SOLN
INTRAVENOUS | Status: DC
  Administered 2018-03-03: 11:00:00 via INTRAVENOUS

## 2018-03-03 MED ORDER — LIDOCAINE HCL (PF) 1 % IJ SOLN
INTRAMUSCULAR | Status: AC
  Filled 2018-03-03: qty 30

## 2018-03-03 MED ORDER — HEPARIN (PORCINE) IN NACL 2-0.9 UNIT/ML-% IJ SOLN
INTRAMUSCULAR | Status: AC
  Filled 2018-03-03: qty 1000

## 2018-03-03 SURGICAL SUPPLY — 14 items
BAND CMPR LRG ZPHR (HEMOSTASIS) ×1
BAND ZEPHYR COMPRESS 30 LONG (HEMOSTASIS) ×1 IMPLANT
CATH BALLN WEDGE 5F 110CM (CATHETERS) ×1 IMPLANT
CATH IMPULSE 5F ANG/FL3.5 (CATHETERS) ×1 IMPLANT
GUIDEWIRE INQWIRE 1.5J.035X260 (WIRE) IMPLANT
INQWIRE 1.5J .035X260CM (WIRE) ×2
KIT HEART LEFT (KITS) ×2 IMPLANT
NDL PERC 21GX4CM (NEEDLE) IMPLANT
NEEDLE PERC 21GX4CM (NEEDLE) ×2 IMPLANT
PACK CARDIAC CATHETERIZATION (CUSTOM PROCEDURE TRAY) ×2 IMPLANT
SHEATH RAIN 4/5FR (SHEATH) ×1 IMPLANT
SHEATH RAIN RADIAL 21G 6FR (SHEATH) ×1 IMPLANT
TRANSDUCER W/STOPCOCK (MISCELLANEOUS) ×2 IMPLANT
TUBING CIL FLEX 10 FLL-RA (TUBING) ×2 IMPLANT

## 2018-03-03 NOTE — Interval H&P Note (Signed)
History and Physical Interval Note:  03/03/2018 1:11 PM  Chad Christensen  has presented today for surgery, with the diagnosis of hf  The various methods of treatment have been discussed with the patient and family. After consideration of risks, benefits and other options for treatment, the patient has consented to  Procedure(s): RIGHT/LEFT HEART CATH AND CORONARY ANGIOGRAPHY (N/A) as a surgical intervention .  The patient's history has been reviewed, patient examined, no change in status, stable for surgery.  I have reviewed the patient's chart and labs.  Questions were answered to the patient's satisfaction.     Deandrew Hoecker [employer]

## 2018-03-03 NOTE — Progress Notes (Signed)
  Echocardiogram 2D Echocardiogram has been performed.  Chad Christensen 03/03/2018, 4:29 PM

## 2018-03-03 NOTE — Discharge Instructions (Signed)

## 2018-03-04 ENCOUNTER — Encounter (HOSPITAL_COMMUNITY): Payer: Self-pay | Admitting: Cardiology

## 2018-03-28 ENCOUNTER — Ambulatory Visit (INDEPENDENT_AMBULATORY_CARE_PROVIDER_SITE_OTHER): Payer: Medicare Other

## 2018-03-28 DIAGNOSIS — I5022 Chronic systolic (congestive) heart failure: Secondary | ICD-10-CM | POA: Diagnosis not present

## 2018-03-28 DIAGNOSIS — Z95810 Presence of automatic (implantable) cardiac defibrillator: Secondary | ICD-10-CM | POA: Diagnosis not present

## 2018-03-28 NOTE — Progress Notes (Signed)
EPIC Encounter for ICM Monitoring  Patient Name: Chad Christensen is a 52 y.o. male Date: 03/28/2018 Primary Care Physican: Julio Sicks Primary Cardiologist:Allred Electrophysiologist: Allred Dry Weight:does not weigh       Heart Failure questions reviewed, pt asymptomatic.    Thoracic impedance normal but was abnormal suggesting fluid accumulation from 03/19/2018 - 03/26/2018.  Prescribed dosage: Furosemide 40 mg 1 tablet every other day.   Labs 03/04/2018 Creatinine 1.11, BUN 14, Potassium 3.9, Sodium 137, EGR >60 05/31/2017 Creatinine 0.62, BUN 10, Potassium 3.4, Sodium 138, EGFR 115-133 03/19/2017 Creatinine 0.71, BUN 8, Potassium 4.8, Sodium 137, EGFR >57 06/12/2016 Creatinine 0.77, BUN 7, Potassium 5.1, Sodium 144  Recommendations: No changes.   Encouraged to call for fluid symptoms.  Follow-up plan: ICM clinic phone appointment on 04/28/2018.  Office appointment scheduled 06/10/2018 with Dr. Rayann Heman.  Copy of ICM check sent to Dr. Rayann Heman.   3 month ICM trend: 03/28/2018    1 Year ICM trend:       Rosalene Billings, RN 03/28/2018 2:33 PM

## 2018-04-08 ENCOUNTER — Encounter: Payer: Medicare Other | Admitting: Internal Medicine

## 2018-04-28 ENCOUNTER — Ambulatory Visit (INDEPENDENT_AMBULATORY_CARE_PROVIDER_SITE_OTHER): Payer: Medicare Other | Admitting: *Deleted

## 2018-04-28 DIAGNOSIS — I10 Essential (primary) hypertension: Secondary | ICD-10-CM | POA: Diagnosis not present

## 2018-04-28 DIAGNOSIS — I472 Ventricular tachycardia, unspecified: Secondary | ICD-10-CM

## 2018-04-28 DIAGNOSIS — I5022 Chronic systolic (congestive) heart failure: Secondary | ICD-10-CM

## 2018-04-28 DIAGNOSIS — Z95810 Presence of automatic (implantable) cardiac defibrillator: Secondary | ICD-10-CM | POA: Diagnosis not present

## 2018-04-28 NOTE — Progress Notes (Signed)
Remote ICD transmission.   

## 2018-04-29 NOTE — Progress Notes (Addendum)
EPIC Encounter for ICM Monitoring  Patient Name: Chad Christensen is a 52 y.o. male Date: 04/29/2018 Primary Care Physican: Julio Sicks Primary Cardiologist:Allred Electrophysiologist: Allred Dry Weight:does not weigh                                                  Heart Failure questions reviewed, pt asymptomatic.    Thoracic impedance normal but was abnormal suggesting fluid accumulation from 03/19/2018 - 03/26/2018.  Prescribed dosage: Furosemide 40 mg 1 tablet every other day.   Labs 03/04/2018 Creatinine 1.11, BUN 14, Potassium 3.9, Sodium 137, EGR >60 05/31/2017 Creatinine 0.62, BUN 10, Potassium 3.4, Sodium 138, EGFR 115-133 03/19/2017 Creatinine 0.71, BUN 8, Potassium 4.8, Sodium 137, EGFR >57 06/12/2016 Creatinine 0.77, BUN 7, Potassium 5.1, Sodium 144  Recommendations:  No changes.   Encouraged to call for fluid symptoms.  Follow-up plan: ICM clinic phone appointment on 05/31/2018.  Office appointment scheduled 06/10/2018 with Dr. Rayann Heman.  Copy of ICM check sent to Dr. Rayann Heman.   3 month ICM trend: 04/29/2018    1 Year ICM trend:       Rosalene Billings, RN 04/29/2018 2:36 PM

## 2018-05-16 LAB — CUP PACEART REMOTE DEVICE CHECK
Battery Remaining Longevity: 35 mo
Battery Remaining Percentage: 30 %
Battery Voltage: 2.86 V
Brady Statistic RV Percent Paced: 1 %
Date Time Interrogation Session: 20190523061401
HighPow Impedance: 89 Ohm
HighPow Impedance: 89 Ohm
Implantable Lead Implant Date: 20110909
Implantable Lead Location: 753860
Implantable Lead Model: 7122
Implantable Pulse Generator Implant Date: 20110909
Lead Channel Impedance Value: 510 Ohm
Lead Channel Pacing Threshold Amplitude: 1 V
Lead Channel Pacing Threshold Pulse Width: 0.5 ms
Lead Channel Sensing Intrinsic Amplitude: 11.8 mV
Lead Channel Setting Pacing Amplitude: 2.5 V
Lead Channel Setting Pacing Pulse Width: 0.5 ms
Lead Channel Setting Sensing Sensitivity: 0.5 mV
Pulse Gen Serial Number: 614505

## 2018-05-31 ENCOUNTER — Ambulatory Visit (INDEPENDENT_AMBULATORY_CARE_PROVIDER_SITE_OTHER): Payer: Medicare Other

## 2018-05-31 ENCOUNTER — Telehealth: Payer: Self-pay

## 2018-05-31 DIAGNOSIS — Z95810 Presence of automatic (implantable) cardiac defibrillator: Secondary | ICD-10-CM

## 2018-05-31 DIAGNOSIS — I5022 Chronic systolic (congestive) heart failure: Secondary | ICD-10-CM

## 2018-05-31 NOTE — Telephone Encounter (Signed)
Remote ICM transmission received.  Attempted call to patient and left detailed message, per DPR, regarding transmission and next ICM scheduled for 07/11/2018.  Advised to return call for any fluid symptoms or questions.    

## 2018-05-31 NOTE — Progress Notes (Signed)
EPIC Encounter for ICM Monitoring  Patient Name: Chad Christensen is a 52 y.o. male Date: 05/31/2018 Primary Care Physican: Julio Sicks Primary Cardiologist:Allred Electrophysiologist: Allred Dry Weight:does not weigh       Attempted call to patient and unable to reach.  Left detailed message, per DPR, regarding transmission.  Transmission reviewed.    Thoracic impedance normal.  Prescribed dosage: Furosemide 40 mg 1 tablet every other day.   Labs 03/04/2018 Creatinine 1.11, BUN 14, Potassium 3.9, Sodium 137, EGR >60 05/31/2017 Creatinine 0.62, BUN 10, Potassium 3.4, Sodium 138, EGFR 115-133 03/19/2017 Creatinine 0.71, BUN 8, Potassium 4.8, Sodium 137, EGFR >57 06/12/2016 Creatinine 0.77, BUN 7, Potassium 5.1, Sodium 144   Recommendations: Left voice mail with ICM number and encouraged to call if experiencing any fluid symptoms.  Follow-up plan: ICM clinic phone appointment on 07/11/2018.  Office appointment scheduled 06/10/2018 with Dr. Lamount Cohen.   Copy of ICM check sent to Dr. Rayann Heman.   3 month ICM trend: 05/31/2018    1 Year ICM trend:       Rosalene Billings, RN 05/31/2018 5:27 PM

## 2018-06-10 ENCOUNTER — Other Ambulatory Visit: Payer: Self-pay

## 2018-06-10 ENCOUNTER — Ambulatory Visit (INDEPENDENT_AMBULATORY_CARE_PROVIDER_SITE_OTHER): Payer: Medicare Other | Admitting: Internal Medicine

## 2018-06-10 ENCOUNTER — Encounter: Payer: Self-pay | Admitting: Internal Medicine

## 2018-06-10 VITALS — BP 115/73 | HR 56 | Ht 72.0 in | Wt 125.0 lb

## 2018-06-10 DIAGNOSIS — I472 Ventricular tachycardia, unspecified: Secondary | ICD-10-CM

## 2018-06-10 DIAGNOSIS — I480 Paroxysmal atrial fibrillation: Secondary | ICD-10-CM | POA: Diagnosis not present

## 2018-06-10 DIAGNOSIS — I10 Essential (primary) hypertension: Secondary | ICD-10-CM

## 2018-06-10 DIAGNOSIS — I5022 Chronic systolic (congestive) heart failure: Secondary | ICD-10-CM | POA: Diagnosis not present

## 2018-06-10 LAB — CUP PACEART INCLINIC DEVICE CHECK
Battery Remaining Longevity: 34 mo
Brady Statistic RV Percent Paced: 0.96 %
Date Time Interrogation Session: 20190705130401
HighPow Impedance: 88.875
Implantable Lead Implant Date: 20110909
Implantable Lead Location: 753860
Implantable Lead Model: 7122
Implantable Pulse Generator Implant Date: 20110909
Lead Channel Impedance Value: 575 Ohm
Lead Channel Pacing Threshold Amplitude: 1 V
Lead Channel Pacing Threshold Amplitude: 1 V
Lead Channel Pacing Threshold Pulse Width: 0.5 ms
Lead Channel Pacing Threshold Pulse Width: 0.5 ms
Lead Channel Sensing Intrinsic Amplitude: 11.8 mV
Lead Channel Setting Pacing Amplitude: 2.5 V
Lead Channel Setting Pacing Pulse Width: 0.5 ms
Lead Channel Setting Sensing Sensitivity: 0.5 mV
Pulse Gen Serial Number: 614505

## 2018-06-10 MED ORDER — AMIODARONE HCL 200 MG PO TABS: 200.0000 mg | ORAL_TABLET | Freq: Every day | ORAL | Status: DC

## 2018-06-10 NOTE — Patient Instructions (Addendum)
Medication Instructions:  Continue all current medications.  Labwork:  CMET, TSH, free T4, Digoxin level - orders given today.  Office will contact with results via phone or letter.    Testing/Procedures: none  Follow-Up: Your physician wants you to follow up in: 6 months.  You will receive a reminder letter in the mail one-two months in advance.  If you don't receive a letter, please call our office to schedule the follow up appointment   Any Other Special Instructions Will Be Listed Below (If Applicable). Remote monitoring is used to monitor your Pacemaker of ICD from home. This monitoring reduces the number of office visits required to check your device to one time per year. It allows [place] to keep an eye on the functioning of your device to ensure it is working properly. You are scheduled for a device check from home on 07/28/2018. You may send your transmission at any time that day. If you have a wireless device, the transmission will be sent automatically. After your physician reviews your transmission, you will receive a postcard with your next transmission date.  If you need a refill on your cardiac medications before your next appointment, please call your pharmacy.

## 2018-06-10 NOTE — Progress Notes (Signed)
PCP: Betti Cruz, PA-C Primary Cardiologist: Dr Shirlee Latch Primary EP: Dr [name]  Chad Christensen is a 52 y.o. male who presents today for routine electrophysiology followup.  Since last being seen in our clinic, the patient reports doing reasonably well.  No further ventricular arrhythmias.  SOB is also better. Today, he denies symptoms of palpitations, chest pain,,  lower extremity edema, dizziness, presyncope, syncope, or ICD shocks.  The patient is otherwise without complaint today.   Past Medical History:  Diagnosis Date  . Gout   . Hypertension   . Paroxysmal atrial fibrillation (HCC)   . Polysubstance abuse (HCC)    In remission since 2003  . Renal insufficiency   . Systolic heart failure    Secondary to nonischemic cardiomyopathy  . Tobacco user   . Ventricular tachycardia (HCC) 04/23/12   CL with appropriate ICD shock delivered   ROS- all systems are reviewed and negative except as per HPI above  Current Outpatient Medications  Medication Sig Dispense Refill  . albuterol (ACCUNEB) 1.25 MG/3ML nebulizer solution Take 1.25 mg by nebulization every 6 (six) hours as needed for wheezing.     Marland Kitchen amiodarone (PACERONE) 200 MG tablet TAKE 1 TABLET 2 TIMES DAILY FOR 2 WEEKS THEN TAKE 1 TABLET DAILY (Patient taking differently: Take 200 mg by mouth See admin instructions. TAKE 1 TABLET 2 TIMES DAILY FOR 2 WEEKS THEN TAKE 1 TABLET DAILY) 180 tablet 1  . clonazePAM (KLONOPIN) 0.5 MG tablet Take 0.5 mg by mouth 2 (two) times daily.     Marland Kitchen COLCRYS 0.6 MG tablet Take 0.6 mg by mouth 2 (two) times daily as needed (for gout flare up).     . digoxin (DIGOX) 0.25 MG tablet Take 1 tablet (250 mcg total) by mouth daily. *Patient is overdue for an appointment and needs to call and schedule for further refills* 15 tablet 0  . furosemide (LASIX) 40 MG tablet Take 1 tablet (40 mg total) every other day. 45 tablet 3  . metoprolol succinate (TOPROL-XL) 25 MG 24 hr tablet Take 2 tablets (50 mg  total) by mouth daily. (Patient taking differently: Take 25 mg by mouth daily. ) 60 tablet 6  . mirtazapine (REMERON) 15 MG tablet Take 15 mg by mouth at bedtime.    . multivitamin (THERAGRAN) per tablet Take 1 tablet by mouth daily.      . OXYGEN Inhale 2 L into the lungs continuous.    Marland Kitchen rOPINIRole (REQUIP) 0.5 MG tablet Take 0.5 mg by mouth at bedtime.  4  . simvastatin (ZOCOR) 5 MG tablet Take 5 mg by mouth daily.    Marland Kitchen spironolactone (ALDACTONE) 25 MG tablet Take 1 tablet (25 mg total) by mouth daily. *Patient is overdue for an appointment and needs to call and schedule for further refills* 15 tablet 0  . warfarin (COUMADIN) 1 MG tablet     . warfarin (COUMADIN) 5 MG tablet Take 5 mg by mouth daily.      No current facility-administered medications for this visit.     Physical Exam: Vitals:   06/10/18 0919  BP: 115/73  Pulse: (!) 56  SpO2: 96%  Weight: 125 lb (56.7 kg)  Height: 6' (1.829 m)    GEN- The patient is chronically ill appearing, alert and oriented x 3 today.   Walks with a cane Head- normocephalic, atraumatic Eyes-  Sclera clear, conjunctiva pink Ears- hearing intact Oropharynx- clear Lungs- Clear to ausculation bilaterally, normal work of breathing Chest- ICD  pocket is well healed Heart- Regular rate and rhythm, no murmurs, rubs or gallops, PMI not laterally displaced GI- soft, NT, ND, + BS Extremities- no clubbing, cyanosis, or edema  ICD interrogation- reviewed in detail today,  See PACEART report    Wt Readings from Last 3 Encounters:  06/10/18 125 lb (56.7 kg)  03/03/18 130 lb (59 kg)  02/25/18 129 lb (58.5 kg)    Assessment and Plan:  1.  Chronic systolic dysfunction euvolemic today RHC/LHC 03/03/18 reviewed and reveals nonischemic CM, well compensated Echo from 03/03/18 reviewed Appreciate Dr Alford Highland input Stable on an appropriate medical regimen Normal ICD function See Arita Miss Art report No changes today followed in ICM device clinic  2.  VT/VF  Well controlled since March (amiodarone started at that time) Bmet, LFTs, TFTs, dig level today No further episodes No changes today Normal ICD function See Arita Miss Art report No changes today  3. HTN Stable No change required today  4. afib Well controlled Given prior stroke, would continue long term anticoagulation  5. Tobacco Remains quite  6. ETOh He has quite  [facility] Return to see me in 6 months  Hillis Range MD, [facility] 06/10/2018 9:40 AM

## 2018-07-11 ENCOUNTER — Ambulatory Visit (INDEPENDENT_AMBULATORY_CARE_PROVIDER_SITE_OTHER): Payer: Medicare Other

## 2018-07-11 DIAGNOSIS — I5022 Chronic systolic (congestive) heart failure: Secondary | ICD-10-CM | POA: Diagnosis not present

## 2018-07-11 DIAGNOSIS — Z95810 Presence of automatic (implantable) cardiac defibrillator: Secondary | ICD-10-CM

## 2018-07-12 ENCOUNTER — Telehealth: Payer: Self-pay

## 2018-07-12 NOTE — Telephone Encounter (Signed)
Remote ICM transmission received.  Attempted call to patient and left detailed message, per DPR, regarding transmission and next ICM scheduled for 08/11/2018.  Advised to return call for any fluid symptoms or questions.    

## 2018-07-12 NOTE — Telephone Encounter (Signed)
Attempted return call to patient as requested in voice mail message that he was woken up this morning by the call.  Left voice mail message apologizine

## 2018-07-12 NOTE — Progress Notes (Signed)
EPIC Encounter for ICM Monitoring  Patient Name: Chad Christensen is a 52 y.o. male Date: 07/12/2018 Primary Care Physican: Julio Sicks Primary Cardiologist:Allred Electrophysiologist: Allred Dry Weight:does not weigh       Attempted call to patient and unable to reach.  Left detailed message, per DPR, regarding transmission.  Transmission reviewed.    Thoracic impedance abnormal suggesting fluid accumulation starting 06/28/2018 but is back at baseline 07/11/2018.  Prescribed dosage: Furosemide 40 mg 1 tablet every other day.   Labs 03/04/2018 Creatinine 1.11, BUN 14, Potassium 3.9, Sodium 137, EGR >60 05/31/2017 Creatinine 0.62, BUN 10, Potassium 3.4, Sodium 138, EGFR 115-133 03/19/2017 Creatinine 0.71, BUN 8, Potassium 4.8, Sodium 137, EGFR >57 06/12/2016 Creatinine 0.77, BUN 7, Potassium 5.1, Sodium 144  Recommendations: Left voice mail with ICM number and encouraged to call if experiencing any fluid symptoms.  Follow-up plan: ICM clinic phone appointment on 08/11/2018.    Copy of ICM check sent to Dr. Rayann Heman.   3 month ICM trend: 07/12/2018    1 Year ICM trend:       Rosalene Billings, RN 07/12/2018 11:11 AM

## 2018-07-12 NOTE — Telephone Encounter (Signed)
LMOVM reminding pt to send remote transmission.   

## 2018-08-11 ENCOUNTER — Ambulatory Visit (INDEPENDENT_AMBULATORY_CARE_PROVIDER_SITE_OTHER): Payer: Medicare Other

## 2018-08-11 ENCOUNTER — Ambulatory Visit (INDEPENDENT_AMBULATORY_CARE_PROVIDER_SITE_OTHER): Payer: Medicare Other | Admitting: *Deleted

## 2018-08-11 DIAGNOSIS — Z95810 Presence of automatic (implantable) cardiac defibrillator: Secondary | ICD-10-CM

## 2018-08-11 DIAGNOSIS — I5022 Chronic systolic (congestive) heart failure: Secondary | ICD-10-CM | POA: Diagnosis not present

## 2018-08-11 DIAGNOSIS — I420 Dilated cardiomyopathy: Secondary | ICD-10-CM

## 2018-08-11 NOTE — Progress Notes (Signed)
EPIC Encounter for ICM Monitoring  Patient Name: Chad Christensen is a 51 y.o. male Date: 08/11/2018 Primary Care Physican: Eure, Courtney H, PA-C Primary Cardiologist:Allred Electrophysiologist: Allred Dry Weight:does not weigh       Attempted call to patient and unable to reach.   Transmission reviewed.    Thoracic impedance normal today but was abnormal suggesting fluid accumulation from 07/27/2018 - 08/10/2018.  Prescribed dosage: Furosemide 40 mg 1 tablet every other day.   Labs 03/04/2018 Creatinine 1.11, BUN 14, Potassium 3.9, Sodium 137, EGR >60 05/31/2017 Creatinine 0.62, BUN 10, Potassium 3.4, Sodium 138, EGFR 115-133 03/19/2017 Creatinine 0.71, BUN 8, Potassium 4.8, Sodium 137, EGFR >57 06/12/2016 Creatinine 0.77, BUN 7, Potassium 5.1, Sodium 144  Recommendations: NONE - Unable to reach.  Follow-up plan: ICM clinic phone appointment on 09/01/2018 to recheck fluid levels.    Copy of ICM check sent to Dr. Allred.   3 month ICM trend: 08/11/2018    1 Year ICM trend:       Laurie S Short, RN 08/11/2018 4:31 PM   

## 2018-08-11 NOTE — Progress Notes (Signed)
Remote ICD transmission.   

## 2018-08-12 ENCOUNTER — Telehealth: Payer: Self-pay

## 2018-08-12 NOTE — Telephone Encounter (Signed)
Remote ICM transmission received.  Attempted call to patient and number has been disconnected.

## 2018-09-01 ENCOUNTER — Ambulatory Visit (INDEPENDENT_AMBULATORY_CARE_PROVIDER_SITE_OTHER): Payer: Medicare Other

## 2018-09-01 DIAGNOSIS — Z95810 Presence of automatic (implantable) cardiac defibrillator: Secondary | ICD-10-CM

## 2018-09-01 DIAGNOSIS — I5022 Chronic systolic (congestive) heart failure: Secondary | ICD-10-CM

## 2018-09-02 NOTE — Progress Notes (Signed)
EPIC Encounter for ICM Monitoring  Patient Name: Chad Christensen is a 52 y.o. male Date: 09/02/2018 Primary Care Physican: Leory Plowman, PA-C Primary Cardiologist:Allred Electrophysiologist: Allred Dry Weight:does not weigh        Heart Failure questions reviewed, pt had ankle swelling at time of decreased impedance and takes extra Torsemide pill when needed.   Thoracic impedance has returned close to baseline on 09/01/2018.   Impedance also decreased from 07/27/2018 - 08/10/2018; 08/18/2018 - 08/23/2018.  Showing a pattern of valleys and peaks since July.   Prescribed: Furosemide 40 mg 1 tablet every other day.   Labs 03/04/2018 Creatinine 1.11, BUN 14, Potassium 3.9, Sodium 137, EGR >60 05/31/2017 Creatinine 0.62, BUN 10, Potassium 3.4, Sodium 138, EGFR 115-133 03/19/2017 Creatinine 0.71, BUN 8, Potassium 4.8, Sodium 137, EGFR >57 06/12/2016 Creatinine 0.77, BUN 7, Potassium 5.1, Sodium 144  Recommendations: No changes.    Encouraged to call for fluid symptoms.  Follow-up plan: ICM clinic phone appointment on 09/22/2018.    Copy of ICM check sent to Dr. Rayann Heman.   3 month ICM trend: 09/01/2018    1 Year ICM trend:       Rosalene Billings, RN 09/02/2018 9:09 AM

## 2018-09-06 LAB — CUP PACEART REMOTE DEVICE CHECK
Battery Remaining Longevity: 34 mo
Battery Remaining Percentage: 29 %
Battery Voltage: 2.84 V
Brady Statistic RV Percent Paced: 1 %
Date Time Interrogation Session: 20190905060009
HighPow Impedance: 78 Ohm
HighPow Impedance: 78 Ohm
Implantable Lead Implant Date: 20110909
Implantable Lead Location: 753860
Implantable Lead Model: 7122
Implantable Pulse Generator Implant Date: 20110909
Lead Channel Impedance Value: 450 Ohm
Lead Channel Pacing Threshold Amplitude: 1 V
Lead Channel Pacing Threshold Pulse Width: 0.5 ms
Lead Channel Sensing Intrinsic Amplitude: 8.8 mV
Lead Channel Setting Pacing Amplitude: 2.5 V
Lead Channel Setting Pacing Pulse Width: 0.5 ms
Lead Channel Setting Sensing Sensitivity: 0.5 mV
Pulse Gen Serial Number: 614505

## 2018-09-22 ENCOUNTER — Ambulatory Visit (INDEPENDENT_AMBULATORY_CARE_PROVIDER_SITE_OTHER): Payer: Medicare Other

## 2018-09-22 DIAGNOSIS — I5022 Chronic systolic (congestive) heart failure: Secondary | ICD-10-CM

## 2018-09-22 DIAGNOSIS — Z95810 Presence of automatic (implantable) cardiac defibrillator: Secondary | ICD-10-CM | POA: Diagnosis not present

## 2018-09-23 NOTE — Progress Notes (Signed)
EPIC Encounter for ICM Monitoring  Patient Name: Chad Christensen is a 52 y.o. male Date: 09/23/2018 Primary Care Physican: Daphine Deutscher Primary Cardiologist:Allred Electrophysiologist: Allred Dry Weight:does not weigh        Heart Failure questions reviewed, pt asymptomatic.   Thoracic impedance normal but was abnormal suggesting fluid accumulation from 08/28/2018 - 09/08/2018.   Prescribed: Furosemide 40 mg 1 tablet every other day.   Labs 03/04/2018 Creatinine 1.11, BUN 14, Potassium 3.9, Sodium 137, EGR >60  Recommendations: No changes.  Encouraged to call for fluid symptoms.  Follow-up plan: ICM clinic phone appointment on 10/24/2018.     Copy of ICM check sent to Dr. [name].   3 month ICM trend: 09/22/2018    1 Year ICM trend:       Karie Soda, RN 09/23/2018 10:04 AM

## 2018-10-24 ENCOUNTER — Ambulatory Visit (INDEPENDENT_AMBULATORY_CARE_PROVIDER_SITE_OTHER): Payer: Medicare Other

## 2018-10-24 DIAGNOSIS — Z95810 Presence of automatic (implantable) cardiac defibrillator: Secondary | ICD-10-CM | POA: Diagnosis not present

## 2018-10-24 DIAGNOSIS — I5022 Chronic systolic (congestive) heart failure: Secondary | ICD-10-CM | POA: Diagnosis not present

## 2018-10-25 ENCOUNTER — Telehealth: Payer: Self-pay

## 2018-10-25 NOTE — Progress Notes (Signed)
EPIC Encounter for ICM Monitoring  Patient Name: Chad Christensen is a 52 y.o. male Date: 10/25/2018 Primary Care Physican: Betti Cruz, PA-C Primary Cardiologist:Allred Electrophysiologist: Allred Last Weight:  unknown       Attempted call to patient and unable to reach.  Left detailed message, per DPR, regarding transmission.  Transmission reviewed.    Thoracic impedance normal but was abnormal suggesting fluid accumulation from 09/27/2018 - 10/09/2018.   Prescribed: Furosemide 40 mg 1 tablet every other day.   Labs 03/04/2018 Creatinine 1.11, BUN 14, Potassium 3.9, Sodium 137, EGR >60  Recommendations: Left voice mail with ICM number and encouraged to call if experiencing any fluid symptoms.  Follow-up plan: ICM clinic phone appointment on 11/24/2018.   Office appointment scheduled 12/16/2018 with Dr. [name].    Copy of ICM check sent to Dr. [name].   3 month ICM trend: 10/25/2018    1 Year ICM trend:       Karie Soda, RN 10/25/2018 11:49 AM

## 2018-10-25 NOTE — Telephone Encounter (Signed)
Remote ICM transmission received.  Attempted call to patient regarding ICM remote transmission and left detailed message, per DPR, with next ICM remote transmission date of 11/24/2018.  Advised to return call for any fluid symptoms or questions.    

## 2018-11-09 ENCOUNTER — Telehealth: Payer: Self-pay | Admitting: *Deleted

## 2018-11-10 ENCOUNTER — Ambulatory Visit (INDEPENDENT_AMBULATORY_CARE_PROVIDER_SITE_OTHER): Payer: Medicare Other

## 2018-11-10 DIAGNOSIS — I420 Dilated cardiomyopathy: Secondary | ICD-10-CM | POA: Diagnosis not present

## 2018-11-10 NOTE — Progress Notes (Signed)
Remote ICD transmission.   

## 2018-11-15 ENCOUNTER — Encounter: Payer: Self-pay | Admitting: Cardiology

## 2018-11-24 ENCOUNTER — Ambulatory Visit (INDEPENDENT_AMBULATORY_CARE_PROVIDER_SITE_OTHER): Payer: Medicare Other

## 2018-11-24 DIAGNOSIS — Z95810 Presence of automatic (implantable) cardiac defibrillator: Secondary | ICD-10-CM | POA: Diagnosis not present

## 2018-11-24 DIAGNOSIS — I5022 Chronic systolic (congestive) heart failure: Secondary | ICD-10-CM

## 2018-11-25 NOTE — Progress Notes (Signed)
EPIC Encounter for ICM Monitoring  Patient Name: Chad Christensen is a 52 y.o. male Date: 11/25/2018 Primary Care Physican: Betti Cruz, PA-C Primary Care Physican: Daphine Deutscher Primary Cardiologist:Allred Electrophysiologist: Allred Last Weight:  unknown                                                   Transmission reviewed.    Thoracic impedance normal but was abnormal suggesting fluid accumulation from 11/11/2018 - 11/22/2018.   Prescribed: Furosemide 40 mg 1 tablet every other day.   Labs 03/04/2018 Creatinine 1.11, BUN 14, Potassium 3.9, Sodium 137, EGR >60  Recommendations: None  Follow-up plan: ICM clinic phone appointment on 01/16/2019.   Office appointment scheduled 12/16/2018 with Dr. [name].    Copy of ICM check sent to Dr. [name].   3 month ICM trend: 11/24/2018   1 Year ICM trend:       Karie Soda, RN 11/25/2018 2:06 PM

## 2018-12-09 ENCOUNTER — Encounter: Payer: Medicare Other | Admitting: Internal Medicine

## 2018-12-16 ENCOUNTER — Ambulatory Visit (INDEPENDENT_AMBULATORY_CARE_PROVIDER_SITE_OTHER): Payer: Medicare Other | Admitting: Internal Medicine

## 2018-12-16 ENCOUNTER — Encounter: Payer: Self-pay | Admitting: Internal Medicine

## 2018-12-16 VITALS — BP 118/82 | HR 79 | Ht 72.0 in | Wt 115.0 lb

## 2018-12-16 DIAGNOSIS — Z95810 Presence of automatic (implantable) cardiac defibrillator: Secondary | ICD-10-CM | POA: Diagnosis not present

## 2018-12-16 DIAGNOSIS — I472 Ventricular tachycardia, unspecified: Secondary | ICD-10-CM

## 2018-12-16 DIAGNOSIS — I10 Essential (primary) hypertension: Secondary | ICD-10-CM

## 2018-12-16 DIAGNOSIS — Z79899 Other long term (current) drug therapy: Secondary | ICD-10-CM

## 2018-12-16 DIAGNOSIS — I5022 Chronic systolic (congestive) heart failure: Secondary | ICD-10-CM | POA: Diagnosis not present

## 2018-12-16 LAB — CUP PACEART INCLINIC DEVICE CHECK
Battery Remaining Longevity: 32 mo
Brady Statistic RV Percent Paced: 0.4 %
Date Time Interrogation Session: 20200110174256
HighPow Impedance: 87.75 Ohm
Implantable Lead Implant Date: 20110909
Implantable Lead Location: 753860
Implantable Lead Model: 7122
Implantable Pulse Generator Implant Date: 20110909
Lead Channel Impedance Value: 650 Ohm
Lead Channel Pacing Threshold Amplitude: 0.5 V
Lead Channel Pacing Threshold Pulse Width: 0.5 ms
Lead Channel Sensing Intrinsic Amplitude: 11.6 mV
Lead Channel Setting Pacing Amplitude: 2.5 V
Lead Channel Setting Pacing Pulse Width: 0.5 ms
Lead Channel Setting Sensing Sensitivity: 0.5 mV
Pulse Gen Serial Number: 614505

## 2018-12-16 NOTE — Progress Notes (Signed)
PCP: Betti Cruz, PA-C Primary Cardiologist: Dr Shirlee Latch Primary EP: Dr [name]  Chad Christensen is a 53 y.o. male who presents today for routine electrophysiology followup.  Since last being seen in our clinic, the patient reports doing very well.  He did have recent hospitalization for GI bleeding in the setting of colonic polyps and supratherapeutic INR.  Today, he denies symptoms of palpitations, chest pain, shortness of breath,  lower extremity edema, dizziness, presyncope, syncope, or ICD shocks.  The patient is otherwise without complaint today.   Past Medical History:  Diagnosis Date  . Gout   . Hypertension   . Paroxysmal atrial fibrillation (HCC)   . Polysubstance abuse (HCC)    In remission since 2003  . Renal insufficiency   . Systolic heart failure    Secondary to nonischemic cardiomyopathy  . Tobacco user   . Ventricular tachycardia (HCC) 04/23/12   CL with appropriate ICD shock delivered    ROS- all systems are reviewed and negative except as per HPI above  Current Outpatient Medications  Medication Sig Dispense Refill  . albuterol (ACCUNEB) 1.25 MG/3ML nebulizer solution Take 1.25 mg by nebulization every 6 (six) hours as needed for wheezing.     Marland Kitchen amiodarone (PACERONE) 200 MG tablet Take 1 tablet (200 mg total) by mouth daily.    . clonazePAM (KLONOPIN) 0.5 MG tablet Take 0.5 mg by mouth 2 (two) times daily.     Marland Kitchen COLCRYS 0.6 MG tablet Take 0.6 mg by mouth 2 (two) times daily as needed (for gout flare up).     . digoxin (DIGOX) 0.25 MG tablet Take 1 tablet (250 mcg total) by mouth daily. *Patient is overdue for an appointment and needs to call and schedule for further refills* 15 tablet 0  . furosemide (LASIX) 40 MG tablet Take 1 tablet (40 mg total) every other day. 45 tablet 3  . mirtazapine (REMERON) 15 MG tablet Take 15 mg by mouth at bedtime.    . multivitamin (THERAGRAN) per tablet Take 1 tablet by mouth daily.      . OXYGEN Inhale 2 L into the  lungs continuous.    Marland Kitchen rOPINIRole (REQUIP) 0.5 MG tablet Take 0.5 mg by mouth at bedtime.  4  . simvastatin (ZOCOR) 5 MG tablet Take 5 mg by mouth daily.    Marland Kitchen spironolactone (ALDACTONE) 25 MG tablet Take 1 tablet (25 mg total) by mouth daily. *Patient is overdue for an appointment and needs to call and schedule for further refills* 15 tablet 0  . warfarin (COUMADIN) 1 MG tablet      No current facility-administered medications for this visit.     Physical Exam: Vitals:   12/16/18 1142  BP: 118/82  Pulse: 79  SpO2: 98%  Weight: 115 lb (52.2 kg)  Height: 6' (1.829 m)    GEN- The patient is well appearing, alert and oriented x 3 today.   Head- normocephalic, atraumatic Eyes-  Sclera clear, conjunctiva pink Ears- hearing intact Oropharynx- clear Lungs- Clear to ausculation bilaterally, normal work of breathing Chest- ICD pocket is well healed Heart- Regular rate and rhythm, no murmurs, rubs or gallops, PMI not laterally displaced GI- soft, NT, ND, + BS Extremities- no clubbing, cyanosis, or edema  ICD interrogation- reviewed in detail today,  See PACEART report  ekg tracing ordered today is personally reviewed and shows sinus rhythm, narrow QRS  Wt Readings from Last 3 Encounters:  12/16/18 115 lb (52.2 kg)  06/10/18 125 lb (  56.7 kg)  03/03/18 130 lb (59 kg)    Assessment and Plan:  1.  Chronic systolic dysfunction/ nonischemic CM euvolemic today Stable on an appropriate medical regimen Normal ICD function See Pace Art report No changes today followed in ICM device clinic  2. VT/VF Currently improved On amiodarone Check  CMT, TSH, T4, dig level today  3. HTN Stable No change required today  4. afib Well controlled Continue long term anticoagulation given prior stroke He has INRs followed in [place] Check cbc today  5. Tobacco/ETOH Remains quit  Merlin Return in a year Follow-up with CHF clinic in 6 months  Hillis Range MD, [facility] 12/16/2018 12:58 PM

## 2018-12-16 NOTE — Patient Instructions (Addendum)
Medication Instructions:   Your physician recommends that you continue on your current medications as directed. Please refer to the Current Medication list given to you today.  Labwork:  Your physician recommends that you return for lab work in: TODAY to check your CMET, CBC, TSH, T4 and DIG level. Please go to [facility] when you leave the office today to have this done.  Testing/Procedures:  NONE  Follow-Up:  Your physician recommends that you schedule a follow-up appointment in: 1 year with Dr. [name].   Your physician recommends that you schedule a follow-up appointment in: 6 months with Dr. Shirlee Latch  Any Other Special Instructions Will Be Listed Below (If Applicable).  If you need a refill on your cardiac medications before your next appointment, please call your pharmacy.

## 2018-12-20 ENCOUNTER — Other Ambulatory Visit: Payer: Self-pay | Admitting: *Deleted

## 2018-12-20 ENCOUNTER — Telehealth: Payer: Self-pay | Admitting: *Deleted

## 2018-12-20 DIAGNOSIS — I480 Paroxysmal atrial fibrillation: Secondary | ICD-10-CM

## 2018-12-20 DIAGNOSIS — I5022 Chronic systolic (congestive) heart failure: Secondary | ICD-10-CM

## 2018-12-20 DIAGNOSIS — Z79899 Other long term (current) drug therapy: Secondary | ICD-10-CM

## 2018-12-20 MED ORDER — DIGOXIN 125 MCG PO TABS: 0.1250 mg | ORAL_TABLET | Freq: Every day | ORAL | 3 refills | Status: DC

## 2018-12-20 NOTE — Telephone Encounter (Signed)
Patient informed and verbalized understanding of plan. Copy sent to PCP and lab order mailed to home address. Per patient, he will have lab work done at [facility] in 2 weeks.

## 2018-12-20 NOTE — Telephone Encounter (Signed)
-----   Message from Hillis Range, MD sent at 12/19/2018  9:48 PM EST ----- Results reviewed.  Isabelle Course, please inform pt of result.  Hold digoxin x 48 hours then reduce digoxin to 0.125mg  daily.  Repeat dig level in 2 weeks.  He may need to do this closer to where he lives in [place] due to transportation issues.

## 2018-12-21 ENCOUNTER — Telehealth: Payer: Self-pay | Admitting: *Deleted

## 2018-12-21 NOTE — Telephone Encounter (Signed)
Spoke with pt about BeAT HF study. He is not interested at time.

## 2018-12-21 NOTE — Telephone Encounter (Signed)
Pt not interested in study at this time.

## 2018-12-22 ENCOUNTER — Telehealth: Payer: Self-pay | Admitting: Internal Medicine

## 2018-12-22 NOTE — Telephone Encounter (Signed)
° ° °  Lori from [facility] calling to request order to obtain labs during home visit. Patient has transportation issues.   Please call [employer] @ [phone]

## 2018-12-22 NOTE — Telephone Encounter (Signed)
Notified Lori with [facility] - Dig level not due till 01/03/19.  Lab order was mailed to patients home on 12/20/2018.  Lori verbalized understanding.

## 2018-12-31 LAB — CUP PACEART REMOTE DEVICE CHECK
Battery Remaining Longevity: 32 mo
Battery Remaining Percentage: 27 %
Battery Voltage: 2.83 V
Brady Statistic RV Percent Paced: 1 %
Date Time Interrogation Session: 20191204141813
HighPow Impedance: 91 Ohm
HighPow Impedance: 91 Ohm
Implantable Lead Implant Date: 20110909
Implantable Lead Location: 753860
Implantable Lead Model: 7122
Implantable Pulse Generator Implant Date: 20110909
Lead Channel Impedance Value: 660 Ohm
Lead Channel Pacing Threshold Amplitude: 1 V
Lead Channel Pacing Threshold Pulse Width: 0.5 ms
Lead Channel Sensing Intrinsic Amplitude: 11.8 mV
Lead Channel Setting Pacing Amplitude: 2.5 V
Lead Channel Setting Pacing Pulse Width: 0.5 ms
Lead Channel Setting Sensing Sensitivity: 0.5 mV
Pulse Gen Serial Number: 614505

## 2019-01-16 ENCOUNTER — Ambulatory Visit (INDEPENDENT_AMBULATORY_CARE_PROVIDER_SITE_OTHER): Payer: Medicare Other

## 2019-01-16 DIAGNOSIS — Z95810 Presence of automatic (implantable) cardiac defibrillator: Secondary | ICD-10-CM

## 2019-01-16 DIAGNOSIS — I5022 Chronic systolic (congestive) heart failure: Secondary | ICD-10-CM | POA: Diagnosis not present

## 2019-01-17 NOTE — Progress Notes (Signed)
EPIC Encounter for ICM Monitoring  Patient Name: Chad Christensen is a 53 y.o. male Date: 01/17/2019 Primary Care Physican: Betti Cruz, PA-C Primary Cardiologist:Allred Electrophysiologist: Allred Last Weight: 115 lbs (12/13/2018 office visit)   Heart failure questions reviewed.  Patient reported feeling well.  Thoracic impedance normal.  Prescribed:Furosemide 40 mg 1 tablet every other day.   Labs 12/19/2018 Creatinine 0.76, BUN 10, Potassium 4.4, Sodium 138, GFR >60 03/04/2018 Creatinine 1.11, BUN 14, Potassium 3.9, Sodium 137, GFR >60  Recommendations:No changes and encouraged to call for fluid symptoms.  Follow-up plan: ICM clinic phone appointment on 02/20/2019.     Copy of ICM check sent to Dr. [name].  3 month ICM trend: 01/16/2019    1 Year ICM trend:       Karie Soda, RN 01/17/2019 8:09 AM

## 2019-01-30 ENCOUNTER — Encounter: Payer: Self-pay | Admitting: *Deleted

## 2019-02-09 ENCOUNTER — Ambulatory Visit (INDEPENDENT_AMBULATORY_CARE_PROVIDER_SITE_OTHER): Payer: Medicare Other | Admitting: *Deleted

## 2019-02-09 DIAGNOSIS — I420 Dilated cardiomyopathy: Secondary | ICD-10-CM | POA: Diagnosis not present

## 2019-02-09 LAB — CUP PACEART REMOTE DEVICE CHECK
Battery Remaining Longevity: 28 mo
Battery Remaining Percentage: 24 %
Battery Voltage: 2.8 V
Brady Statistic RV Percent Paced: 1 %
Date Time Interrogation Session: 20200305074546
HighPow Impedance: 83 Ohm
HighPow Impedance: 83 Ohm
Implantable Lead Implant Date: 20110909
Implantable Lead Location: 753860
Implantable Lead Model: 7122
Implantable Pulse Generator Implant Date: 20110909
Lead Channel Impedance Value: 510 Ohm
Lead Channel Pacing Threshold Amplitude: 0.5 V
Lead Channel Pacing Threshold Pulse Width: 0.5 ms
Lead Channel Sensing Intrinsic Amplitude: 11.8 mV
Lead Channel Setting Pacing Amplitude: 2.5 V
Lead Channel Setting Pacing Pulse Width: 0.5 ms
Lead Channel Setting Sensing Sensitivity: 0.5 mV
Pulse Gen Serial Number: 614505

## 2019-02-11 ENCOUNTER — Other Ambulatory Visit: Payer: Self-pay | Admitting: Internal Medicine

## 2019-02-17 ENCOUNTER — Encounter: Payer: Self-pay | Admitting: Cardiology

## 2019-02-17 NOTE — Progress Notes (Signed)
Remote ICD transmission.   

## 2019-02-20 ENCOUNTER — Ambulatory Visit (INDEPENDENT_AMBULATORY_CARE_PROVIDER_SITE_OTHER): Payer: Medicare Other

## 2019-02-20 DIAGNOSIS — Z95810 Presence of automatic (implantable) cardiac defibrillator: Secondary | ICD-10-CM

## 2019-02-20 DIAGNOSIS — I5022 Chronic systolic (congestive) heart failure: Secondary | ICD-10-CM

## 2019-02-20 NOTE — Progress Notes (Signed)
EPIC Encounter for ICM Monitoring  Patient Name: Chad Christensen is a 53 y.o. male Date: 02/20/2019 Primary Care Physican: Betti Cruz, PA-C Primary Cardiologist:Allred Electrophysiologist: Allred Last Weight: (115 lbs (12/13/2018 office visit)   Heart failure questions reviewed.  Patiet asymptomatic.  Thoracic impedance normal.  Prescribed:Furosemide 40 mg 1 tablet every other day.   Labs 12/19/2018 Creatinine 0.76, BUN 10, Potassium 4.4, Sodium 138, GFR >60 03/04/2018 Creatinine 1.11, BUN 14, Potassium 3.9, Sodium 137, GFR >60  Recommendations:No changes and encouraged to call for fluid symptoms.  Follow-up plan: ICM clinic phone appointment on4/20/2020.   Copy of ICM check sent to Dr.Allred.  3 month ICM trend: 02/20/2019    1 Year ICM trend:       Karie Soda, RN 02/20/2019 10:44 AM

## 2019-03-27 ENCOUNTER — Ambulatory Visit (INDEPENDENT_AMBULATORY_CARE_PROVIDER_SITE_OTHER): Payer: Medicare Other

## 2019-03-27 ENCOUNTER — Other Ambulatory Visit: Payer: Self-pay

## 2019-03-27 DIAGNOSIS — I5022 Chronic systolic (congestive) heart failure: Secondary | ICD-10-CM | POA: Diagnosis not present

## 2019-03-27 DIAGNOSIS — Z95810 Presence of automatic (implantable) cardiac defibrillator: Secondary | ICD-10-CM

## 2019-03-28 NOTE — Progress Notes (Signed)
EPIC Encounter for ICM Monitoring  Patient Name: Chad Christensen is a 53 y.o. male Date: 03/28/2019 Primary Care Physican: Daphine Deutscher Primary Cardiologist:Allred Electrophysiologist: Allred 03/28/2019 Weight: 126 lbs   Heart failure questions reviewed. Patient asymptomatic.  [place] Thoracic impedance normal.  Prescribed:Furosemide 40 mg 1 tablet every other day.   Labs 12/19/2018 Creatinine 0.76, BUN 10, Potassium 4.4, Sodium 138, GFR >60 03/04/2018 Creatinine 1.11, BUN 14, Potassium 3.9, Sodium 137, GFR >60  Recommendations: No changes and encouraged to call for fluid symptoms.  Follow-up plan: ICM clinic phone appointment on6/02/2019.   Copy of ICM check sent to Dr.Allred.   3 month ICM trend: 03/27/2019    1 Year ICM trend:       Karie Soda, RN 03/28/2019 9:06 AM

## 2019-05-10 ENCOUNTER — Ambulatory Visit (INDEPENDENT_AMBULATORY_CARE_PROVIDER_SITE_OTHER): Payer: Medicare Other

## 2019-05-10 DIAGNOSIS — Z95810 Presence of automatic (implantable) cardiac defibrillator: Secondary | ICD-10-CM

## 2019-05-10 DIAGNOSIS — I5022 Chronic systolic (congestive) heart failure: Secondary | ICD-10-CM | POA: Diagnosis not present

## 2019-05-11 ENCOUNTER — Ambulatory Visit (INDEPENDENT_AMBULATORY_CARE_PROVIDER_SITE_OTHER): Payer: Medicare Other | Admitting: *Deleted

## 2019-05-11 DIAGNOSIS — I420 Dilated cardiomyopathy: Secondary | ICD-10-CM

## 2019-05-11 DIAGNOSIS — I5022 Chronic systolic (congestive) heart failure: Secondary | ICD-10-CM | POA: Diagnosis not present

## 2019-05-11 LAB — CUP PACEART REMOTE DEVICE CHECK
Battery Remaining Longevity: 26 mo
Battery Remaining Percentage: 22 %
Battery Voltage: 2.8 V
Brady Statistic RV Percent Paced: 1 %
Date Time Interrogation Session: 20200604102858
HighPow Impedance: 86 Ohm
HighPow Impedance: 86 Ohm
Implantable Lead Implant Date: 20110909
Implantable Lead Location: 753860
Implantable Lead Model: 7122
Implantable Pulse Generator Implant Date: 20110909
Lead Channel Impedance Value: 490 Ohm
Lead Channel Sensing Intrinsic Amplitude: 11.8 mV
Lead Channel Setting Pacing Amplitude: 2.5 V
Lead Channel Setting Pacing Pulse Width: 0.5 ms
Lead Channel Setting Sensing Sensitivity: 0.5 mV
Pulse Gen Serial Number: 614505

## 2019-05-12 NOTE — Progress Notes (Signed)
EPIC Encounter for ICM Monitoring  Patient Name: Chad Christensen is a 53 y.o. male Date: 05/12/2019 Primary Care Physican: Betti Cruz, PA-C Primary Cardiologist:Allred Electrophysiologist: Allred 03/28/2019 Weight: 126 lbs  05/12/2019 Weight: 127 lbs.  Heart failure questions reviewed. Patientasymptomatic.  [place] Thoracic impedance normal.  Prescribed:Furosemide 40 mg 1 tablet every other day.   Labs 12/19/2018 Creatinine 0.76, BUN 10, Potassium 4.4, Sodium 138, GFR >60 03/04/2018 Creatinine 1.11, BUN 14, Potassium 3.9, Sodium 137, GFR >60  Recommendations: No changes and encouraged to call for fluid symptoms.  Follow-up plan: ICM clinic phone appointment on7/05/2019.   Copy of ICM check sent to Dr.Allred.   3 month ICM trend: 05/11/2019    1 Year ICM trend:       Karie Soda, RN 05/12/2019 2:28 PM

## 2019-05-18 NOTE — Progress Notes (Signed)
Remote ICD transmission.   

## 2019-06-02 ENCOUNTER — Encounter (HOSPITAL_COMMUNITY): Payer: Self-pay | Admitting: Cardiology

## 2019-06-02 ENCOUNTER — Other Ambulatory Visit (HOSPITAL_COMMUNITY): Payer: Self-pay

## 2019-06-02 ENCOUNTER — Ambulatory Visit (HOSPITAL_COMMUNITY)
Admission: RE | Admit: 2019-06-02 | Discharge: 2019-06-02 | Disposition: A | Discharge status: Home / Self Care | Payer: Medicare Other | Source: Ambulatory Visit | Attending: Cardiology | Admitting: Cardiology

## 2019-06-02 ENCOUNTER — Other Ambulatory Visit: Payer: Self-pay

## 2019-06-02 VITALS — BP 100/74 | HR 78 | Wt 127.6 lb

## 2019-06-02 DIAGNOSIS — I110 Hypertensive heart disease with heart failure: Secondary | ICD-10-CM | POA: Insufficient documentation

## 2019-06-02 DIAGNOSIS — I480 Paroxysmal atrial fibrillation: Secondary | ICD-10-CM | POA: Diagnosis not present

## 2019-06-02 DIAGNOSIS — J449 Chronic obstructive pulmonary disease, unspecified: Secondary | ICD-10-CM | POA: Insufficient documentation

## 2019-06-02 DIAGNOSIS — I428 Other cardiomyopathies: Secondary | ICD-10-CM | POA: Diagnosis not present

## 2019-06-02 DIAGNOSIS — Z8719 Personal history of other diseases of the digestive system: Secondary | ICD-10-CM | POA: Diagnosis not present

## 2019-06-02 DIAGNOSIS — Z79899 Other long term (current) drug therapy: Secondary | ICD-10-CM | POA: Insufficient documentation

## 2019-06-02 DIAGNOSIS — Z95810 Presence of automatic (implantable) cardiac defibrillator: Secondary | ICD-10-CM | POA: Insufficient documentation

## 2019-06-02 DIAGNOSIS — M109 Gout, unspecified: Secondary | ICD-10-CM | POA: Diagnosis not present

## 2019-06-02 DIAGNOSIS — Z7901 Long term (current) use of anticoagulants: Secondary | ICD-10-CM | POA: Diagnosis not present

## 2019-06-02 DIAGNOSIS — F17290 Nicotine dependence, other tobacco product, uncomplicated: Secondary | ICD-10-CM | POA: Insufficient documentation

## 2019-06-02 DIAGNOSIS — I5022 Chronic systolic (congestive) heart failure: Secondary | ICD-10-CM | POA: Diagnosis present

## 2019-06-02 LAB — COMPREHENSIVE METABOLIC PANEL
ALT: 13 U/L (ref 0–44)
AST: 16 U/L (ref 15–41)
Albumin: 4.5 g/dL (ref 3.5–5.0)
Alkaline Phosphatase: 31 U/L — ABNORMAL LOW (ref 38–126)
Anion gap: 11 (ref 5–15)
BUN: 9 mg/dL (ref 6–20)
CO2: 23 mmol/L (ref 22–32)
Calcium: 9.4 mg/dL (ref 8.9–10.3)
Chloride: 107 mmol/L (ref 98–111)
Creatinine, Ser: 1.05 mg/dL (ref 0.61–1.24)
GFR calc Af Amer: >60 mL/min (ref >60)
GFR calc non Af Amer: >60 mL/min (ref >60)
Glucose, Bld: 80 mg/dL (ref 70–99)
Potassium: 4.9 mmol/L (ref 3.5–5.1)
Sodium: 141 mmol/L (ref 135–145)
Total Bilirubin: 0.4 mg/dL (ref 0.3–1.2)
Total Protein: 7.5 g/dL (ref 6.5–8.1)

## 2019-06-02 LAB — CBC
HCT: 44.4 % (ref 39.0–52.0)
Hemoglobin: 14.8 g/dL (ref 13.0–17.0)
MCH: 33 pg (ref 26.0–34.0)
MCHC: 33.3 g/dL (ref 30.0–36.0)
MCV: 99.1 fL (ref 80.0–100.0)
Platelets: 206 10*3/uL (ref 150–400)
RBC: 4.48 MIL/uL (ref 4.22–5.81)
RDW: 13.9 % (ref 11.5–15.5)
WBC: 8.9 10*3/uL (ref 4.0–10.5)
nRBC: 0 % (ref 0.0–0.2)

## 2019-06-02 LAB — TSH: TSH: 1.184 u[IU]/mL (ref 0.350–4.500)

## 2019-06-02 LAB — DIGOXIN LEVEL: Digoxin Level: 0.6 ng/mL — ABNORMAL LOW (ref 0.8–2.0)

## 2019-06-02 MED ORDER — LOSARTAN POTASSIUM 25 MG PO TABS: 12.5000 mg | ORAL_TABLET | Freq: Every day | ORAL | 3 refills | Status: DC

## 2019-06-02 NOTE — Patient Instructions (Addendum)
START Losartan 12.5mg  (1/2 tab) nightly  Labs today We will only contact you if something comes back abnormal or we need to make some changes. Otherwise no news is good news!  Your physician has requested that you have an echocardiogram. Echocardiography is a painless test that uses sound waves to create images of your heart. It provides your doctor with information about the size and shape of your heart and how well your heart's chambers and valves are working. This procedure takes approximately one hour. There are no restrictions for this procedure.  This will be done in January 2021.  Your physician has recommended that you have a pulmonary function test. Pulmonary Function Tests are a group of tests that measure how well air moves in and out of your lungs. This test will be scheduled in January 2021.    Your physician recommends that you schedule a follow-up appointment in: 1 year. Office will call you to schedule this appointment.

## 2019-06-04 NOTE — Progress Notes (Signed)
PCP: Betti Cruz, PA-C EP: Dr. [name] Cardiology: Dr. Shirlee Latch  53 yo with history of nonischemic dilated cardiomyopathy and paroxysmal atrial fibrillation presents for followup of CHF.  Patient lives in [place], [place].  He gets sporadic cardiology care as it is hard for him to get a ride to [place] and he does not want to see any of the cardiologists in [place]. He is followed by Dr. [name] for his [facility] ICD.  I last saw him back in 3/19, when he was referred by Dr. [name] for left/right heart cath.  This showed no coronary disease and low but not markedly low cardiac output (CI 2.22).  Echo in 3/19 showed a severely dilated LV with EF 20%.  He presents today for his "routine followup" post-cath.   He walks with a cane chronically due to arthritis (neck and back pain, has had back surgery) and poor balance, says this has actually been better recently (used to use a walker).  Generally no exertional dyspnea if we walks slow on flat ground, short of breath with fast walking or inclines.  No falls.  No chest pain.  No orthopnea/PND.  Occasional lightheadedness if he stands too fast.  He is smoking 1 cigar/day.   ECG (personally reviewed): NSR, IVCD 128 msec  St Jude device interrogation: Stable thoracic impedance, no VT.   Labs (1/20): digoxin 0.6, K 4.4, creatinine 0.76  PMH: 1. H/o VT 2. H/o GI bleeding 3. Colon polyps 4. Remote substance abuse. 5. HTN 6. Gout 7. Atrial fibrillation: Paroxysmal.  8. ?COPD: Smokes cigars.  9. Chronic systolic CHF: Nonischemic cardiomyopathy.  St Jude ICD.  - Echo (3/19): EF 20%, severe LV dilation, diffuse hypokinesis, mild-moderate MR, low normal RV function.  - LHC/RHC (3/19): No CAD; mean RA 2, PA 24/8, mean PCWP 13, CI 2.2.  10. Back surgery.   Social History   Socioeconomic History  . Marital status: Single    Spouse name: Not on file  . Number of children: Not on file  . Years of education: Not on file  . Highest education level:  Not on file  Occupational History  . Occupation: Disability  Social Needs  . Financial resource strain: Not on file  . Food insecurity    Worry: Not on file    Inability: Not on file  . Transportation needs    Medical: Not on file    Non-medical: Not on file  Tobacco Use  . Smoking status: Light Tobacco Smoker    Packs/day: 0.30    Years: 25.00    Pack years: 7.50    Types: Cigars    Start date: 09/13/1982    Last attempt to quit: 12/07/2017    Years since quitting: 1.4  . Smokeless tobacco: Never Used  Substance and Sexual Activity  . Alcohol use: Yes    Alcohol/week: 2.0 - 3.0 standard drinks    Types: 2 - 3 Cans of beer per week  . 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 Sexual activity: Not on file  Lifestyle  . Physical activity    Days per week: Not on file    Minutes per session: Not on file  . Stress: Not on file  Relationships  . Social Musician on phone: Not on file    Gets together: Not on file    Attends religious service: Not on file    Active member  of club or organization: Not on file    Attends meetings of clubs or organizations: Not on file    Relationship status: Not on file  . Intimate partner violence    Fear of current or ex partner: Not on file    Emotionally abused: Not on file    Physically abused: Not on file    Forced sexual activity: Not on file  Other Topics Concern  . Not on file  Social History Narrative   Lives in [place], [place].   Went to [employer] for 2.5 years.   Family History  Problem Relation Age of Onset  . Cardiomyopathy Brother        Dilated   ROS: All systems reviewed and negative except as per HPI.   Current Outpatient Medications  Medication Sig Dispense Refill  . albuterol (ACCUNEB) 1.25 MG/3ML nebulizer solution Take 1.25 mg by nebulization every 6 (six) hours as needed for wheezing.     Marland Kitchen amiodarone (PACERONE) 200 MG tablet TAKE 1 TABLET BY MOUTH TWICE DAILY FOR  2 WEEKS, THEN 1 TABLET BY MOUTH EVERY DAY THEREAFTER 90 tablet 3  . clonazePAM (KLONOPIN) 0.5 MG tablet Take 0.5 mg by mouth 2 (two) times daily.     Marland Kitchen COLCRYS 0.6 MG tablet Take 0.6 mg by mouth 2 (two) times daily as needed (for gout flare up).     . digoxin (LANOXIN) 0.125 MG tablet Take 1 tablet (0.125 mg total) by mouth daily. HOLD DIGOXIN FOR 2 DAYS, THEN START LOWER DOSE 90 tablet 3  . furosemide (LASIX) 40 MG tablet Take 1 tablet (40 mg total) every other day. 45 tablet 3  . mirtazapine (REMERON) 15 MG tablet Take 15 mg by mouth at bedtime.    . multivitamin (THERAGRAN) per tablet Take 1 tablet by mouth daily.      Marland Kitchen rOPINIRole (REQUIP) 0.5 MG tablet Take 0.5 mg by mouth at bedtime.  4  . simvastatin (ZOCOR) 5 MG tablet Take 5 mg by mouth daily.    Marland Kitchen spironolactone (ALDACTONE) 25 MG tablet Take 1 tablet (25 mg total) by mouth daily. *Patient is overdue for an appointment and needs to call and schedule for further refills* 15 tablet 0  . warfarin (COUMADIN) 5 MG tablet TK 1 T PO QD    . losartan (COZAAR) 25 MG tablet Take 0.5 tablets (12.5 mg total) by mouth at bedtime. 45 tablet 3   No current facility-administered medications for this encounter.    BP 100/74   Pulse 78   Wt 57.9 kg (127 lb 9.6 oz)   SpO2 98%   BMI 17.31 kg/m  General: NAD, thin Neck: No JVD, no thyromegaly or thyroid nodule.  Lungs: Clear to auscultation bilaterally with normal respiratory effort. CV: Nondisplaced PMI.  Heart regular S1/S2, no S3/S4, no murmur.  No peripheral edema.  No carotid bruit.  Normal pedal pulses.  Abdomen: Soft, nontender, no hepatosplenomegaly, no distention.  Skin: Intact without lesions or rashes.  Neurologic: Alert and oriented x 3.  Psych: Normal affect. Extremities: No clubbing or cyanosis.  HEENT: Normal.   Assessment/Plan: 1. Chronic systolic CHF: Echo in 5/80 with severely dilated LV, EF 20%.  LHC/RHC in 3/20 with no coronary disease, relatively preserved cardiac output.   Nonischemic cardiomyopathy with St Jude ICD.  Not CRT candidate. NYHA class II-III, seems most limited by arthritis and imbalance.  He is not volume overloaded on exam.  - He takes Lasix prn.  - Continue digoxin 0.125 daily,  check level today.  - Continue spironolactone 25 mg daily.  - BP is soft, will add losartan 12.5 mg qhs, check BMET in 2 wks (can do in [place] and fax down to me).  - I think that it would be helpful to see him more often for medication titration, CPX, echo, PFTs, and close followup.  However, he has a hard time getting a ride down here and is only willing to come once a year to see me and once a year to see Dr. Johney FrameAllred.  PCP should get BMET on him every 3 months.  It would be helpful if PCP would start Coreg at low dose if his BP tolerates losartan.  - He will get repeat echo when he follows up with me in 1 year.  2. Smoking/?COPD: Patient still smokes 1 cigar/day.  I recommended that he quit.  - I will have him get PFTs when he follows up with me again.  3. H/o VT: He is on amiodarone.  - Check LFTs, TSH.  He should get a regular eye exam.  4. Atrial fibrillation: Paroxysmal.  - Continue warfarin.  Followup in 1 year (he is only willing to come down once a year).   Chad Christensen 06/04/2019

## 2019-06-05 ENCOUNTER — Telehealth (HOSPITAL_COMMUNITY): Payer: Self-pay

## 2019-06-05 NOTE — Telephone Encounter (Signed)
Pt aware of lab results. Pt advised to follow low k diet. Pt did not endorse any questions. Verbalized understanding.

## 2019-06-05 NOTE — Telephone Encounter (Signed)
-----   Message from Larey Dresser, MD sent at 06/04/2019 10:46 PM EDT ----- Labs ok.  Keep K low in diet.

## 2019-06-07 ENCOUNTER — Telehealth: Payer: Self-pay | Admitting: Cardiology

## 2019-06-07 NOTE — Telephone Encounter (Signed)
Patient called [facility] -office stating that he needs to speak with Sharman Cheek, RN in regards to recent medications that were given to him. [phone].

## 2019-06-07 NOTE — Telephone Encounter (Signed)
Spoke with patient.  He said it is not clear why Dr Aundra Dubin prescribed Losartan for his BP since his BP readings are usually 110/60-70.  Also he received a warning from the pharmacist that he should not be taking Losartan because he is taking Spironolactone.  He is unsure if he should continue to take the Spironolactone and if he should start the Losartan and why since his BP is within normal limits.  Advised would send the note to Dr Claris Gladden nurse so she can follow up with him  regarding the medication questions.  He said that would be fine and appreciated the help.

## 2019-06-08 NOTE — Telephone Encounter (Signed)
Losartan is not for blood pressure.  It is for his weak heart/cardiomyopathy.  Pharmacist should also know that in patients with a dilated cardiomyopathy, spironolactone + ARB (losartan)/ACEI/ARNI is the standard of care.

## 2019-06-08 NOTE — Telephone Encounter (Signed)
Please advise.   (message sent to [place] for advice)

## 2019-06-12 ENCOUNTER — Ambulatory Visit (INDEPENDENT_AMBULATORY_CARE_PROVIDER_SITE_OTHER): Payer: Medicare Other

## 2019-06-12 DIAGNOSIS — I5022 Chronic systolic (congestive) heart failure: Secondary | ICD-10-CM

## 2019-06-12 DIAGNOSIS — Z95810 Presence of automatic (implantable) cardiac defibrillator: Secondary | ICD-10-CM | POA: Diagnosis not present

## 2019-06-12 NOTE — Progress Notes (Signed)
EPIC Encounter for ICM Monitoring  Patient Name: Chad Christensen is a 53 y.o. male Date: 06/12/2019 Primary Care Physican: Leory Plowman, PA-C Primary Cardiologist:Allred Electrophysiologist: Allred 05/12/2019 Weight: 127 lbs.  Attempted call to patient and unable to reach.  Phone number is not valid. Transmission reviewed.  Dr Claris Gladden responded to patient's question about taking Losartan.  Losartan is not for blood pressure.  It is for his weak heart/cardiomyopathy.  Pharmacist should know that in patients with a dilated cardiomyopathy, spironolactone + ARB (losartan)/ACEI/ARNI is the standard of care.  CorvueThoracic impedance normal.  Prescribed:Furosemide 40 mg 1 tablet every other day.   Labs 06/02/2019 Creatinine 1.05, BUN 9,   Potassium 4.9, Sodium 141, GFR >60 12/19/2018 Creatinine 0.76, BUN 10, Potassium 4.4, Sodium 138, GFR >60 03/04/2018 Creatinine 1.11, BUN 14, Potassium 3.9, Sodium 137, GFR >60  Recommendations:Unable to reach  Follow-up plan: ICM clinic phone appointment on8/09/2019.   Copy of ICM check sent to Dr.Allred.  3 month ICM trend: 06/12/2019    1 Year ICM trend:       Rosalene Billings, RN 06/12/2019 4:51 PM

## 2019-06-12 NOTE — Telephone Encounter (Signed)
Called pt left VM for pt to return call

## 2019-06-13 ENCOUNTER — Telehealth: Payer: Self-pay

## 2019-06-13 NOTE — Telephone Encounter (Signed)
Remote ICM transmission received.  Attempted call to patient regarding ICM remote transmission and listed phone number is no longer valid.

## 2019-07-17 ENCOUNTER — Ambulatory Visit (INDEPENDENT_AMBULATORY_CARE_PROVIDER_SITE_OTHER): Payer: Medicare Other

## 2019-07-17 DIAGNOSIS — I5022 Chronic systolic (congestive) heart failure: Secondary | ICD-10-CM

## 2019-07-17 DIAGNOSIS — Z95810 Presence of automatic (implantable) cardiac defibrillator: Secondary | ICD-10-CM

## 2019-07-18 NOTE — Progress Notes (Signed)
EPIC Encounter for ICM Monitoring  Patient Name: Chad Christensen is a 53 y.o. male Date: 07/18/2019 Primary Care Physican: Julio Sicks Primary [place] Electrophysiologist: Allred 05/12/2019 Weight: 127 lbs.  Spoke with patient. He said he is feeling fine and denies any fluid symptoms.  His coumadin level was 8.0 a few weeks ago but is back to normal.   CorvueThoracic impedance normal.  Prescribed:Furosemide 40 mg 1 tablet every other day.   Labs 06/02/2019 Creatinine 1.05, BUN 9,   Potassium 4.9, Sodium 141, GFR >60 12/19/2018 Creatinine 0.76, BUN 10, Potassium 4.4, Sodium 138, GFR >60 03/04/2018 Creatinine 1.11, BUN 14, Potassium 3.9, Sodium 137, GFR >60  Recommendations:No changes and encouraged to call if experiencing any fluid symptoms.  Follow-up plan: ICM clinic phone appointment on10/04/2019.   Copy of ICM check sent to Dr.Allred.  3 month ICM trend: 07/17/2019    1 Year ICM trend:       Rosalene Billings, RN 07/18/2019 3:42 PM

## 2019-08-10 ENCOUNTER — Ambulatory Visit (INDEPENDENT_AMBULATORY_CARE_PROVIDER_SITE_OTHER): Payer: Medicare Other | Admitting: *Deleted

## 2019-08-10 DIAGNOSIS — I420 Dilated cardiomyopathy: Secondary | ICD-10-CM

## 2019-08-10 LAB — CUP PACEART REMOTE DEVICE CHECK
Battery Remaining Longevity: 24 mo
Battery Remaining Percentage: 20 %
Battery Voltage: 2.77 V
Brady Statistic RV Percent Paced: 1 %
Date Time Interrogation Session: 20200902114019
HighPow Impedance: 88 Ohm
HighPow Impedance: 88 Ohm
Implantable Lead Implant Date: 20110909
Implantable Lead Location: 753860
Implantable Lead Model: 7122
Implantable Pulse Generator Implant Date: 20110909
Lead Channel Impedance Value: 460 Ohm
Lead Channel Pacing Threshold Amplitude: 0.5 V
Lead Channel Pacing Threshold Pulse Width: 0.5 ms
Lead Channel Sensing Intrinsic Amplitude: 10.4 mV
Lead Channel Setting Pacing Amplitude: 2.5 V
Lead Channel Setting Pacing Pulse Width: 0.5 ms
Lead Channel Setting Sensing Sensitivity: 0.5 mV
Pulse Gen Serial Number: 614505

## 2019-08-16 ENCOUNTER — Telehealth: Payer: Self-pay | Admitting: Internal Medicine

## 2019-08-16 NOTE — Telephone Encounter (Signed)
Checking on status of transmission he sent in on Sept 2, 2020

## 2019-08-16 NOTE — Telephone Encounter (Signed)
Confirmed that transmission  Received. No events and device function normal.

## 2019-08-23 ENCOUNTER — Encounter: Payer: Self-pay | Admitting: Cardiology

## 2019-08-23 NOTE — Progress Notes (Signed)
Remote ICD transmission.   

## 2019-09-11 ENCOUNTER — Ambulatory Visit (INDEPENDENT_AMBULATORY_CARE_PROVIDER_SITE_OTHER): Payer: Medicare Other

## 2019-09-11 DIAGNOSIS — Z95810 Presence of automatic (implantable) cardiac defibrillator: Secondary | ICD-10-CM

## 2019-09-11 DIAGNOSIS — I5022 Chronic systolic (congestive) heart failure: Secondary | ICD-10-CM | POA: Diagnosis not present

## 2019-09-11 NOTE — Progress Notes (Signed)
EPIC Encounter for ICM Monitoring  Patient Name: Chad Christensen is a 53 y.o. male Date: 09/11/2019 Primary Care Physican: Julio Sicks Primary Cardiologist:[place] Electrophysiologist: Allred 09/11/2019 Weight: 128 lbs.  Spoke with patient. He said he is feeling good and denies any fluid symptoms.  CorvueThoracic impedance normal.   Prescribed:Furosemide 40 mg 1 tablet every other day.   Labs 06/02/2019 Creatinine 1.05, BUN 9, Potassium 4.9, Sodium 141, GFR >60 12/19/2018 Creatinine 0.76, BUN 10, Potassium 4.4, Sodium 138, GFR >60 03/04/2018 Creatinine 1.11, BUN 14, Potassium 3.9, Sodium 137, GFR >60  Recommendations: Encouraged to call if experiencing fluid symptoms.  Follow-up plan: ICM clinic phone appointment on 10/12/2019.   91 day device clinic remote transmission 11/09/2019.    Copy of ICM check sent to Dr. Rayann Heman.   3 month ICM trend: 09/11/2019    1 Year ICM trend:       Rosalene Billings, RN 09/11/2019 3:10 PM

## 2019-10-12 ENCOUNTER — Ambulatory Visit (INDEPENDENT_AMBULATORY_CARE_PROVIDER_SITE_OTHER): Payer: Medicare Other

## 2019-10-12 DIAGNOSIS — I5022 Chronic systolic (congestive) heart failure: Secondary | ICD-10-CM | POA: Diagnosis not present

## 2019-10-12 DIAGNOSIS — Z95810 Presence of automatic (implantable) cardiac defibrillator: Secondary | ICD-10-CM | POA: Diagnosis not present

## 2019-10-13 NOTE — Progress Notes (Signed)
EPIC Encounter for ICM Monitoring  Patient Name: ACXEL DINGEE is a 53 y.o. male Date: 10/13/2019 Primary Care Physican: Julio Sicks Primary Cardiologist:McLean Electrophysiologist: Allred 09/11/2019 Weight: 128 lbs.  Spoke with patient. He is feeling well and without complaints today.  CorvueThoracic impedance normal.   Prescribed:Furosemide 40 mg 1 tablet every other day.   Labs 06/02/2019 Creatinine 1.05, BUN 9, Potassium 4.9, Sodium 141, GFR >60 12/19/2018 Creatinine 0.76, BUN 10, Potassium 4.4, Sodium 138, GFR >60  Recommendations: No changes and encouraged to call if experiencing any fluid symptoms.  Follow-up plan: ICM clinic phone appointment on 11/14/2019.   91 day device clinic remote transmission 11/13/2019.  Office appt 12/15/2019 with Dr. Rayann Heman.    Copy of ICM check sent to Dr. Rayann Heman.   3 month ICM trend: 10/12/2019    1 Year ICM trend:       Rosalene Billings, RN 10/13/2019 10:17 AM

## 2019-11-01 ENCOUNTER — Telehealth: Payer: Self-pay | Admitting: Internal Medicine

## 2019-11-01 NOTE — Telephone Encounter (Signed)
Spoke with [facility] - she reports pt has c/o getting alittle weak and having some dizziness.  Denies SOB.  He has not been weighing daily but has minimal edema at feet/ankles by [facility] report.  He has clear lung sounds bilaterally.  He has no been taking Furosemide but has been taking spironolactone.  He is not dehydrated.  BP 100/60 HR 88 on 11/24.  It is usually more around 110/60-70s.  HHN is going to f/u with pt as I have not been able to reach him by phone (fast busy signal).  Advised of fall precautions.  He is using his walker.  Suggested he wear knee high compression hose if he has them and to continue to hold Furosemide for now.  HHN will do a prn visit on Friday to f/u.  Will forward Sharman Cheek and Dr Rayann Heman for their f/u and review. (Of note - [facility] received this call at 5:40 pm 11/25.  The message was taken at 10:04 AM)

## 2019-11-01 NOTE — Telephone Encounter (Signed)
Lori with [facility] called in regards to patient. States that he has increased weakness with dizziness at times. He is not taking his Lasix. Please call [phone] or patient [phone].

## 2019-11-03 NOTE — Telephone Encounter (Signed)
Chad Christensen,  Please look at his coreview to make sure that it is stable.  I agree with current plan. He is due to see me in January but we can move this up if needed.  Also followed with Dr Aundra Dubin for CHF

## 2019-11-06 NOTE — Telephone Encounter (Signed)
Call back to patient.  Advised of remote transmission results suggesting possible fluid accumulation since 11/03/2019.  He stated he ate foods very high in salt over the holiday weekend.  Advised to decrease salt intake and to take a prescribed Furosemide for 2 days if he can tolerate it (he has not been taking his prescribed dose on a regular basis).   He stated that will be fine and agreed to plan. Will recheck fluid levels 11/13/2019.   11/06/2019 Corvue Trend

## 2019-11-06 NOTE — Telephone Encounter (Signed)
Spoke with patient.  He reports he has some general weakness and dizziness at times but does not feel like there these symptoms are urgent  He says he has not been feeling as well in the last month but is using his walker for ambulation.  He declined to move up the appointment he has with Dr Rayann Heman in January but will call the [facility] office if he changes his mind. He agreed to send remote device transmission today for review and will call back if abnormal.  He is taking Furosemide as needed instead of daily at this time. Waiting for remote transmission to review.

## 2019-11-07 ENCOUNTER — Telehealth: Payer: Self-pay | Admitting: Internal Medicine

## 2019-11-07 NOTE — Telephone Encounter (Signed)
Phone note routed to ICM.  I am not aware of any lab orders for patient.  I checked the fluid levels on device report as recommended by Dr Rayann Heman.

## 2019-11-07 NOTE — Telephone Encounter (Signed)
Returned call to [place] with [employer].  She was calling to see if we needed any lab work on Pt because she was heading out to check INR anyway.  Read LS note to her from 11/06/19.  Advised at this time not sure that Pt needs blood work, but advised I would forward to LS just to be certain.  Linwood Dibbles with [facility] we would call her IF we needed anything.

## 2019-11-07 NOTE — Telephone Encounter (Signed)
Received call from [facility] with [facility]. States that she spoke with someone last week in regards to patient having labs drawn. She is calling for an order.   Please call [phone].

## 2019-11-09 LAB — CUP PACEART REMOTE DEVICE CHECK
Battery Remaining Longevity: 17 mo
Battery Remaining Percentage: 14 %
Battery Voltage: 2.72 V
Brady Statistic RV Percent Paced: 1 %
Date Time Interrogation Session: 20201203081319
HighPow Impedance: 86 Ohm
HighPow Impedance: 86 Ohm
Implantable Lead Implant Date: 20110909
Implantable Lead Location: 753860
Implantable Lead Model: 7122
Implantable Pulse Generator Implant Date: 20110909
Lead Channel Impedance Value: 480 Ohm
Lead Channel Pacing Threshold Amplitude: 0.5 V
Lead Channel Pacing Threshold Pulse Width: 0.5 ms
Lead Channel Sensing Intrinsic Amplitude: 11.8 mV
Lead Channel Setting Pacing Amplitude: 2.5 V
Lead Channel Setting Pacing Pulse Width: 0.5 ms
Lead Channel Setting Sensing Sensitivity: 0.5 mV
Pulse Gen Serial Number: 614505

## 2019-11-13 ENCOUNTER — Ambulatory Visit (INDEPENDENT_AMBULATORY_CARE_PROVIDER_SITE_OTHER): Payer: Medicare Other | Admitting: *Deleted

## 2019-11-13 DIAGNOSIS — Z95810 Presence of automatic (implantable) cardiac defibrillator: Secondary | ICD-10-CM

## 2019-11-13 LAB — CUP PACEART REMOTE DEVICE CHECK
Battery Remaining Longevity: 17 mo
Battery Remaining Percentage: 14 %
Battery Voltage: 2.72 V
Brady Statistic RV Percent Paced: 1 %
Date Time Interrogation Session: 20201207020933
HighPow Impedance: 77 Ohm
HighPow Impedance: 77 Ohm
Implantable Lead Implant Date: 20110909
Implantable Lead Location: 753860
Implantable Lead Model: 7122
Implantable Pulse Generator Implant Date: 20110909
Lead Channel Impedance Value: 460 Ohm
Lead Channel Pacing Threshold Amplitude: 0.5 V
Lead Channel Pacing Threshold Pulse Width: 0.5 ms
Lead Channel Sensing Intrinsic Amplitude: 11.8 mV
Lead Channel Setting Pacing Amplitude: 2.5 V
Lead Channel Setting Pacing Pulse Width: 0.5 ms
Lead Channel Setting Sensing Sensitivity: 0.5 mV
Pulse Gen Serial Number: 614505

## 2019-11-14 ENCOUNTER — Ambulatory Visit (INDEPENDENT_AMBULATORY_CARE_PROVIDER_SITE_OTHER): Payer: Medicare Other

## 2019-11-14 DIAGNOSIS — Z95810 Presence of automatic (implantable) cardiac defibrillator: Secondary | ICD-10-CM

## 2019-11-14 DIAGNOSIS — I5022 Chronic systolic (congestive) heart failure: Secondary | ICD-10-CM | POA: Diagnosis not present

## 2019-11-15 NOTE — Progress Notes (Signed)
EPIC Encounter for ICM Monitoring  Patient Name: Chad Christensen is a 53 y.o. male Date: 11/15/2019 Primary Care Physican: Julio Sicks Primary [place] Electrophysiologist: Allred 09/11/2019 Weight: 128lbs.  Spoke with patient and he is doing okay.  Voices no complaints today.  Dr Rayann Heman aware patient's had dizziness and weakness in last month as identified by [facility] nurse.  Patient declines earlier office visit with Dr Rayann Heman and is scheduled for 12/15/2019.     CorvueThoracic impedance returned to normal after taking 2 days of Furosemide on 11/30 & 12/1.   Prescribed:Furosemide 40 mg 1 tablet every other day.   11/15/2019 Taking Differently: Takes Furosemide as needed instead of every other day  Labs 06/02/2019 Creatinine 1.05, BUN 9, Potassium 4.9, Sodium 141, GFR >60 12/19/2018 Creatinine 0.76, BUN 10, Potassium 4.4, Sodium 138, GFR >60  Recommendations:  No changes and encouraged to call if experiencing any fluid symptoms.  Follow-up plan: ICM clinic phone appointment on 12/18/2019.   91 day device clinic remote transmission 02/12/2020.  Office appt 12/15/2019 with Dr. Rayann Heman.    Copy of ICM check sent to Dr. Rayann Heman.   3 month ICM trend: 11/13/2019    1 Year ICM trend:       Rosalene Billings, RN 11/15/2019 9:34 AM

## 2019-12-12 ENCOUNTER — Telehealth: Payer: Self-pay | Admitting: Internal Medicine

## 2019-12-12 NOTE — Telephone Encounter (Signed)
Virtual Visit Pre-Appointment Phone Call  "(Name), I am calling you today to discuss your upcoming appointment. We are currently trying to limit exposure to the virus that causes COVID-19 by seeing patients at home rather than in the office."  1. "What is the BEST phone number to call the day of the visit?" -    [phone] 2.  3. Do you have or have access to (through a family member/friend) a smartphone with video capability that we can use for your visit?" a. If yes - list this number in appt notes as cell (if different from BEST phone #) and list the appointment type as a VIDEO visit in appointment notes b. If no - list the appointment type as a PHONE visit in appointment notes  4. Confirm consent - "In the setting of the current Covid19 crisis, you are scheduled for a (phone or video) visit with your provider on (date) at (time).  Just as we do with many in-office visits, in order for you to participate in this visit, we must obtain consent.  If you'd like, I can send this to your mychart (if signed up) or email for you to review.  Otherwise, I can obtain your verbal consent now.  All virtual visits are billed to your insurance company just like a normal visit would be.  By agreeing to a virtual visit, we'd like you to understand that the technology does not allow for your provider to perform an examination, and thus may limit your provider's ability to fully assess your condition. If your provider identifies any concerns that need to be evaluated in person, we will make arrangements to do so.  Finally, though the technology is pretty good, we cannot assure that it will always work on either your or our end, and in the setting of a video visit, we may have to convert it to a phone-only visit.  In either situation, we cannot ensure that we have a secure connection.  Are you willing to proceed?" STAFF: Did the patient verbally acknowledge consent to telehealth visit? Document YES/NO here:YES     5. Advise patient to be prepared - "Two hours prior to your appointment, go ahead and check your blood pressure, pulse, oxygen saturation, and your weight (if you have the equipment to check those) and write them all down. When your visit starts, your provider will ask you for this information. If you have an Apple Watch or Kardia device, please plan to have heart rate information ready on the day of your appointment. Please have a pen and paper handy nearby the day of the visit as well."  6. Give patient instructions for MyChart download to smartphone OR Doximity/Doxy.me as below if video visit (depending on what platform provider is using)  7. Inform patient they will receive a phone call 15 minutes prior to their appointment time (may be from unknown caller ID) so they should be prepared to answer    TELEPHONE CALL NOTE  PIUS BYROM has been deemed a candidate for a follow-up tele-health visit to limit community exposure during the Covid-19 pandemic. I spoke with the patient via phone to ensure availability of phone/video source, confirm preferred email & phone number, and discuss instructions and expectations.  I reminded ANDIE MORTIMER to be prepared with any vital sign and/or heart rhythm information that could potentially be obtained via home monitoring, at the time of his visit. I reminded Chad BRISCO to expect a phone call prior  to his visit.  Megan Salon 12/12/2019 2:46 PM   INSTRUCTIONS FOR DOWNLOADING THE MYCHART APP TO SMARTPHONE  - The patient must first make sure to have activated MyChart and know their login information - If Apple, go to [employer] and type in MyChart in the search bar and download the app. If Android, ask patient to go to [employer] and type in Johnson in the search bar and download the app. The app is free but as with any other app downloads, their phone may require them to verify saved payment information or Apple/Android password.  - The  patient will need to then log into the app with their MyChart username and password, and select  as their healthcare provider to link the account. When it is time for your visit, go to the MyChart app, find appointments, and click Begin Video Visit. Be sure to Select Allow for your device to access the Microphone and Camera for your visit. You will then be connected, and your provider will be with you shortly.  **If they have any issues connecting, or need assistance please contact MyChart service desk [phone])**  **If using a computer, in order to ensure the best quality for their visit they will need to use either of the following Internet Browsers: [employer] or Google Chrome**  IF USING DOXIMITY or DOXY.ME - The patient will receive a link just prior to their visit by text.     FULL LENGTH CONSENT FOR TELE-HEALTH VISIT   I hereby voluntarily request, consent and authorize [facility] and its employed or contracted physicians, physician assistants, nurse practitioners or other licensed health care professionals (the Practitioner), to provide me with telemedicine health care services (the Services") as deemed necessary by the treating Practitioner. I acknowledge and consent to receive the Services by the Practitioner via telemedicine. I understand that the telemedicine visit will involve communicating with the Practitioner through live audiovisual communication technology and the disclosure of certain medical information by electronic transmission. I acknowledge that I have been given the opportunity to request an in-person assessment or other available alternative prior to the telemedicine visit and am voluntarily participating in the telemedicine visit.  I understand that I have the right to withhold or withdraw my consent to the use of telemedicine in the course of my care at any time, without affecting my right to future care or treatment, and that the  Practitioner or I may terminate the telemedicine visit at any time. I understand that I have the right to inspect all information obtained and/or recorded in the course of the telemedicine visit and may receive copies of available information for a reasonable fee.  I understand that some of the potential risks of receiving the Services via telemedicine include:   Delay or interruption in medical evaluation due to technological equipment failure or disruption;  Information transmitted may not be sufficient (e.g. poor resolution of images) to allow for appropriate medical decision making by the Practitioner; and/or   In rare instances, security protocols could fail, causing a breach of personal health information.  Furthermore, I acknowledge that it is my responsibility to provide information about my medical history, conditions and care that is complete and accurate to the best of my ability. I acknowledge that Practitioner's advice, recommendations, and/or decision may be based on factors not within their control, such as incomplete or inaccurate data provided by me or distortions of diagnostic images or specimens that may result from electronic transmissions.  I understand that the practice of medicine is not an exact science and that Practitioner makes no warranties or guarantees regarding treatment outcomes. I acknowledge that I will receive a copy of this consent concurrently upon execution via email to the email address I last provided but may also request a printed copy by calling the office of [place].    I understand that my insurance will be billed for this visit.   I have read or had this consent read to me.  I understand the contents of this consent, which adequately explains the benefits and risks of the Services being provided via telemedicine.   I have been provided ample opportunity to ask questions regarding this consent and the Services and have had my questions answered to my  satisfaction.  I give my informed consent for the services to be provided through the use of telemedicine in my medical care  By participating in this telemedicine visit I agree to the above.

## 2019-12-15 ENCOUNTER — Encounter: Payer: Self-pay | Admitting: Internal Medicine

## 2019-12-15 ENCOUNTER — Telehealth (INDEPENDENT_AMBULATORY_CARE_PROVIDER_SITE_OTHER): Payer: Medicare Other | Admitting: Internal Medicine

## 2019-12-15 VITALS — Ht 72.0 in | Wt 123.0 lb

## 2019-12-15 DIAGNOSIS — I472 Ventricular tachycardia, unspecified: Secondary | ICD-10-CM

## 2019-12-15 DIAGNOSIS — I420 Dilated cardiomyopathy: Secondary | ICD-10-CM | POA: Diagnosis not present

## 2019-12-15 DIAGNOSIS — I480 Paroxysmal atrial fibrillation: Secondary | ICD-10-CM | POA: Diagnosis not present

## 2019-12-15 DIAGNOSIS — I5022 Chronic systolic (congestive) heart failure: Secondary | ICD-10-CM | POA: Diagnosis not present

## 2019-12-15 DIAGNOSIS — I10 Essential (primary) hypertension: Secondary | ICD-10-CM

## 2019-12-15 NOTE — Progress Notes (Signed)
Electrophysiology TeleHealth Note  Due to national recommendations of social distancing due to COVID 19, an audio telehealth visit is felt to be most appropriate for this patient at this time.  Verbal consent was obtained by me for the telehealth visit today.  The patient does not have capability for a virtual visit.  A phone visit is therefore required today.   Date:  12/15/2019   ID:  [name], DOB 08/04/1966, MRN [id number]  Location: patient's home  Provider location:  [place]  Evaluation Performed: Follow-up visit  PCP:  Betti Cruz, PA-C   Electrophysiologist:  Dr [name]  Chief Complaint:  palpitations  History of Present Illness:    Chad Christensen is a 54 y.o. male who presents via telehealth conferencing today.  Since last being seen in our clinic, the patient reports doing reasonably well.  He continues to struggle with CHF.  He has been to the ED recently with CHF and has had multiple prior hospitalizations, mostly in [place].  He has SOB with moderate activity.  + dizziness which limits medicines.  Today, he denies symptoms of palpitations, chest pain,  lower extremity edema, ICD shocks or syncope.  The patient is otherwise without complaint today.  The patient denies symptoms of fevers, chills, cough, or new SOB worrisome for COVID 19.  Past Medical History:  Diagnosis Date  . Gout   . Hypertension   . Paroxysmal atrial fibrillation (HCC)   . Polysubstance abuse (HCC)    In remission since 2003  . Renal insufficiency   . Systolic heart failure    Secondary to nonischemic cardiomyopathy  . Tobacco user   . Ventricular tachycardia (HCC) 04/23/12   CL with appropriate ICD shock delivered    Current Outpatient Medications  Medication Sig Dispense Refill  . albuterol (ACCUNEB) 1.25 MG/3ML nebulizer solution Take 1.25 mg by nebulization every 6 (six) hours as needed for wheezing.     Marland Kitchen amiodarone (PACERONE) 200 MG tablet Take 200 mg by mouth  daily.    . clonazePAM (KLONOPIN) 0.5 MG tablet Take 0.5 mg by mouth 2 (two) times daily.     Marland Kitchen COLCRYS 0.6 MG tablet Take 0.6 mg by mouth 2 (two) times daily as needed (for gout flare up).     . digoxin (LANOXIN) 0.125 MG tablet Take 1 tablet (0.125 mg total) by mouth daily. HOLD DIGOXIN FOR 2 DAYS, THEN START LOWER DOSE 90 tablet 3  . furosemide (LASIX) 40 MG tablet Take 40 mg by mouth as needed.    . mirtazapine (REMERON) 15 MG tablet Take 15 mg by mouth at bedtime.    . multivitamin (THERAGRAN) per tablet Take 1 tablet by mouth daily.      Marland Kitchen nystatin (MYCOSTATIN) 100000 UNIT/ML suspension Take 5 mLs by mouth daily.    Marland Kitchen rOPINIRole (REQUIP) 0.5 MG tablet Take 0.5 mg by mouth at bedtime.  4  . simvastatin (ZOCOR) 5 MG tablet Take 5 mg by mouth daily.    Marland Kitchen spironolactone (ALDACTONE) 25 MG tablet Take 1 tablet (25 mg total) by mouth daily. *Patient is overdue for an appointment and needs to call and schedule for further refills* 15 tablet 0  . warfarin (COUMADIN) 5 MG tablet pmd managing     No current facility-administered medications for this visit.    Allergies:   Morphine and related and Penicillins   Social History:  The patient  reports that he has been smoking cigars. He started smoking  about 37 years ago. He has a 7.50 pack-year smoking history. He has never used smokeless tobacco. He reports current alcohol use of about 2.0 - 3.0 standard drinks of alcohol per week. He reports current drug use. Drug: Marijuana.   Family History:  The patient's family history includes Cardiomyopathy in his brother.   ROS:  Please see the history of present illness.   All other systems are personally reviewed and negative.    Exam:    Vital Signs:  Ht 6' (1.829 m)   Wt 123 lb (55.8 kg)   BMI 16.68 kg/m   Well sounding, alert and conversant   Labs/Other Tests and Data Reviewed:    Recent Labs: 06/02/2019: ALT 13; BUN 9; Creatinine, Ser 1.05; Hemoglobin 14.8; Platelets 206; Potassium 4.9;  Sodium 141; TSH 1.184   Wt Readings from Last 3 Encounters:  12/15/19 123 lb (55.8 kg)  06/02/19 127 lb 9.6 oz (57.9 kg)  12/16/18 115 lb (52.2 kg)     Last device remote is reviewed from [employer] which reveals normal device function, no arrhythmias    ASSESSMENT & PLAN:    1.  Chronic systolic dysfunction/ nonischemic CM/ NYHA Class III CHF The patient has advanced heart failure.  QRS < 130 msec and therefore not a CRT candidate.  He has been treated with medical therapy through the CHF clinic though therapies are limited by finances and low BP. I have advised that he consider Barostim therapy.  He is very interested in this. He would likely be a good candidate for out Batwire study. I will refer him for consideration. Follows in [facility] device clinic with Sharman Cheek  2. VT/VF Doing well with amiodarone Remotes are uptodate Normal ICD function  3. HTN Stable No change required today  4. afib wel lcontrolled He has had a prior stroke  He is on coumadin with INRs followed at the [facility] for chads2vasc score of 4.  Follow-up:  with me in a year unless we are able to move forward with Batwire/ barostim in the interim.   Patient Risk:  after full review of this patients clinical status, I feel that they are at high risk at this time.  Today, I have spent 20 minutes with the patient with telehealth technology discussing arrhythmia management .    Army Fossa, MD  12/15/2019 10:02 AM     [facility] [address] [place] [address] [phone] (office) [phone] (fax)

## 2019-12-18 ENCOUNTER — Ambulatory Visit (INDEPENDENT_AMBULATORY_CARE_PROVIDER_SITE_OTHER): Payer: Medicare Other

## 2019-12-18 DIAGNOSIS — Z95810 Presence of automatic (implantable) cardiac defibrillator: Secondary | ICD-10-CM

## 2019-12-18 DIAGNOSIS — I5022 Chronic systolic (congestive) heart failure: Secondary | ICD-10-CM | POA: Diagnosis not present

## 2019-12-18 NOTE — Progress Notes (Signed)
EPIC Encounter for ICM Monitoring  Patient Name: Chad Christensen is a 54 y.o. male Date: 12/18/2019 Primary Care Physican: Betti Cruz, PA-C Primary Cardiologist:Allred Electrophysiologist: Allred 12/18/2019 Weight: 129lbs.  Spoke with patient and he is doing okay.  He discussed dizziness with Dr [name] on 1/8 telehealth visit.  He denies fluid symptoms at this time.  He reports he may be eating too much salt in his diet.  CorvueThoracic impedance suggesting possible ongoing fluid accumulation since 12/14/19.   Prescribed:Furosemide 40 mg 1 tablet as needed.  Labs 06/02/2019 Creatinine 1.05, BUN 9, Potassium 4.9, Sodium 141, GFR >60 12/19/2018 Creatinine 0.76, BUN 10, Potassium 4.4, Sodium 138, GFR >60  Recommendations:Advised to take Furosemide 0.5 tablet (20 mg total) x 2 days and then return to PRN.  Follow-up plan: ICM clinic phone appointment on1/15/2021 (manual send) to recheck fluid levels. 91 day device clinic remote transmission 02/12/2020.   Copy of ICM check sent to Dr.Allred.    3 month ICM trend: 12/18/2019    1 Year ICM trend:       Karie Soda, RN 12/18/2019 1:14 PM

## 2019-12-22 ENCOUNTER — Ambulatory Visit (INDEPENDENT_AMBULATORY_CARE_PROVIDER_SITE_OTHER): Payer: Medicare Other

## 2019-12-22 DIAGNOSIS — Z95810 Presence of automatic (implantable) cardiac defibrillator: Secondary | ICD-10-CM

## 2019-12-22 DIAGNOSIS — I5022 Chronic systolic (congestive) heart failure: Secondary | ICD-10-CM

## 2019-12-22 NOTE — Progress Notes (Signed)
EPIC Encounter for ICM Monitoring  Patient Name: Chad Christensen is a 54 y.o. male Date: 12/22/2019 Primary Care Physican: Betti Cruz, PA-C Primary Cardiologist:Allred Electrophysiologist: Allred 12/18/2019 Weight: 129lbs.  Spoke with patient and he is doing okay since taking extra Furosemide.   CorvueThoracic impedance returned to normal after taking PRN Furosemide.  Prescribed:Furosemide 40 mg 1 tablet as needed.  Labs 06/02/2019 Creatinine 1.05, BUN 9, Potassium 4.9, Sodium 141, GFR >60 12/19/2018 Creatinine 0.76, BUN 10, Potassium 4.4, Sodium 138, GFR >60  Recommendations: No changes and encouraged to call if experiencing any fluid symptoms.   Follow-up plan: ICM clinic phone appointment on2/15/2021. 91 day device clinic remote transmission3/07/2020.   Copy of ICM check sent to Dr.Allred.   3 month ICM trend: 12/21/2019    1 Year ICM trend:       Karie Soda, RN 12/22/2019 2:26 PM

## 2020-01-22 ENCOUNTER — Ambulatory Visit (INDEPENDENT_AMBULATORY_CARE_PROVIDER_SITE_OTHER): Payer: Medicare Other

## 2020-01-22 DIAGNOSIS — Z95810 Presence of automatic (implantable) cardiac defibrillator: Secondary | ICD-10-CM

## 2020-01-22 DIAGNOSIS — I5022 Chronic systolic (congestive) heart failure: Secondary | ICD-10-CM

## 2020-01-23 NOTE — Progress Notes (Signed)
EPIC Encounter for ICM Monitoring  Patient Name: Chad Christensen is a 54 y.o. male Date: 01/23/2020 Primary Care Physican: Daphine Deutscher Primary Cardiologist:Allred Electrophysiologist: Allred 2/17/2021Weight: 125lbs.  Spoke with patient and reports feeling well at this time.  Denies fluid symptoms.    CorvueThoracic impedancesuggesting possible fluid accumulation since 01/18/2020.  Prescribed:Furosemide 40 mg 1 tablet as needed.  Labs 06/02/2019 Creatinine 1.05, BUN 9, Potassium 4.9, Sodium 141, GFR >60 12/19/2018 Creatinine 0.76, BUN 10, Potassium 4.4, Sodium 138, GFR >60  Recommendations: Advised to take PRN Furosemide 1 tablet x 2 days and then return to PRN.   Follow-up plan: ICM clinic phone appointment on2/24/2021 to recheck fluid levels. 91 day device clinic remote transmission3/07/2020.   Copy of ICM check sent to Dr.Allred.  3 month ICM trend: 01/22/2020    1 Year ICM trend:       Karie Soda, RN 01/23/2020 11:47 AM

## 2020-01-31 ENCOUNTER — Ambulatory Visit (INDEPENDENT_AMBULATORY_CARE_PROVIDER_SITE_OTHER): Payer: Medicare Other

## 2020-01-31 DIAGNOSIS — Z95810 Presence of automatic (implantable) cardiac defibrillator: Secondary | ICD-10-CM

## 2020-01-31 DIAGNOSIS — I5022 Chronic systolic (congestive) heart failure: Secondary | ICD-10-CM

## 2020-02-02 NOTE — Progress Notes (Signed)
EPIC Encounter for ICM Monitoring  Patient Name: Chad Christensen is a 54 y.o. male Date: 02/02/2020 Primary Care Physican: Daphine Deutscher Primary Cardiologist:Allred Electrophysiologist: Allred 2/17/2021Weight: 125lbs.  Spoke with patient and reports feeling well at this time.  Denies fluid symptoms.    CorvueThoracic impedancereturned to normal after taking PRN Furosemide.  Prescribed:Furosemide 40 mg 1 tablet as needed.  Labs 06/02/2019 Creatinine 1.05, BUN 9, Potassium 4.9, Sodium 141, GFR >60 12/19/2018 Creatinine 0.76, BUN 10, Potassium 4.4, Sodium 138, GFR >60  Recommendations:No changes and encouraged to call if experiencing any fluid symptoms..  Follow-up plan: ICM clinic phone appointment on 3/32/2021 to recheck fluid levels. 91 day device clinic remote transmission3/07/2020.   Copy of ICM check sent to Dr.Allred.  3 month ICM trend: 01/31/2020    1 Year ICM trend:       Karie Soda, RN 02/02/2020 4:16 PM

## 2020-02-09 ENCOUNTER — Other Ambulatory Visit: Payer: Self-pay | Admitting: Internal Medicine

## 2020-02-09 ENCOUNTER — Telehealth: Payer: Self-pay | Admitting: *Deleted

## 2020-02-09 NOTE — Telephone Encounter (Signed)
error 

## 2020-02-09 NOTE — Telephone Encounter (Signed)
Called Chad Christensen to discuss the possibility of enrolling in out [employer] study, pt has talked to both Dr. [name] and Mardelle Matte about this recently.  He is very interested in talking to [place] about this but he lives in [place]. His son, who is a Runner, broadcasting/film/video, is his only means of transportation and can not get to [place] appts until early summer. He requests that I call him back in May to set up an appt to start the BATwire process.

## 2020-02-12 ENCOUNTER — Ambulatory Visit (INDEPENDENT_AMBULATORY_CARE_PROVIDER_SITE_OTHER): Payer: Medicare Other | Admitting: *Deleted

## 2020-02-12 DIAGNOSIS — Z95810 Presence of automatic (implantable) cardiac defibrillator: Secondary | ICD-10-CM

## 2020-02-12 LAB — CUP PACEART REMOTE DEVICE CHECK
Battery Remaining Longevity: 12 mo
Battery Remaining Percentage: 10 %
Battery Voltage: 2.68 V
Brady Statistic RV Percent Paced: 1 %
Date Time Interrogation Session: 20210308020009
HighPow Impedance: 78 Ohm
HighPow Impedance: 78 Ohm
Implantable Lead Implant Date: 20110909
Implantable Lead Location: 753860
Implantable Lead Model: 7122
Implantable Pulse Generator Implant Date: 20110909
Lead Channel Impedance Value: 480 Ohm
Lead Channel Pacing Threshold Amplitude: 0.5 V
Lead Channel Pacing Threshold Pulse Width: 0.5 ms
Lead Channel Sensing Intrinsic Amplitude: 11.2 mV
Lead Channel Setting Pacing Amplitude: 2.5 V
Lead Channel Setting Pacing Pulse Width: 0.5 ms
Lead Channel Setting Sensing Sensitivity: 0.5 mV
Pulse Gen Serial Number: 614505

## 2020-02-12 NOTE — Progress Notes (Signed)
ICD Remote  

## 2020-02-27 ENCOUNTER — Ambulatory Visit (INDEPENDENT_AMBULATORY_CARE_PROVIDER_SITE_OTHER): Payer: Medicare Other

## 2020-02-27 DIAGNOSIS — Z95810 Presence of automatic (implantable) cardiac defibrillator: Secondary | ICD-10-CM | POA: Diagnosis not present

## 2020-02-27 DIAGNOSIS — I5022 Chronic systolic (congestive) heart failure: Secondary | ICD-10-CM | POA: Diagnosis not present

## 2020-02-29 ENCOUNTER — Telehealth: Payer: Self-pay | Admitting: *Deleted

## 2020-02-29 NOTE — Telephone Encounter (Signed)
I called patient to see if he would be interested in coming to [facility] for his appt with Dr. Shirlee Latch and also to have his BATwire screening tests. He was very excited to hear this and is agreeable to any arrangements we make for transportation. The only day his is not available in on Friday 03-09-19; he gets his "SS check that day and pays his bills". We will call him back as soon as arrangements can be made.

## 2020-03-01 NOTE — Progress Notes (Signed)
EPIC Encounter for ICM Monitoring  Patient Name: Chad Christensen is a 54 y.o. male Date: 03/01/2020 Primary Care Physican: Daphine Deutscher Primary Cardiologist:Allred Electrophysiologist: Allred 2/17/2021Weight: 125lbs.  Transmission reviewed.   CorvueThoracic impedancenormal.  Prescribed:Furosemide 40 mg 1 tablet as needed.  Labs 06/02/2019 Creatinine 1.05, BUN 9, Potassium 4.9, Sodium 141, GFR >60 12/19/2018 Creatinine 0.76, BUN 10, Potassium 4.4, Sodium 138, GFR >60  Recommendations:None  Follow-up plan: ICM clinic phone appointment on 04/02/2020. 91 day device clinic remote transmission 05/13/2020.   Copy of ICM check sent to Dr.Allred.  3 month ICM trend: 02/27/2020    1 Year ICM trend:       Karie Soda, RN 03/01/2020 3:12 PM

## 2020-03-11 ENCOUNTER — Other Ambulatory Visit: Payer: Self-pay | Admitting: *Deleted

## 2020-03-11 MED ORDER — DIGOXIN 125 MCG PO TABS: 0.1250 mg | ORAL_TABLET | Freq: Every day | ORAL | 1 refills | Status: DC

## 2020-03-11 MED ORDER — AMIODARONE HCL 200 MG PO TABS: 200.0000 mg | ORAL_TABLET | Freq: Every day | ORAL | 1 refills | Status: DC

## 2020-04-02 ENCOUNTER — Ambulatory Visit (INDEPENDENT_AMBULATORY_CARE_PROVIDER_SITE_OTHER): Payer: Medicare Other

## 2020-04-02 DIAGNOSIS — Z95810 Presence of automatic (implantable) cardiac defibrillator: Secondary | ICD-10-CM | POA: Diagnosis not present

## 2020-04-02 DIAGNOSIS — I5022 Chronic systolic (congestive) heart failure: Secondary | ICD-10-CM | POA: Diagnosis not present

## 2020-04-05 NOTE — Progress Notes (Signed)
EPIC Encounter for ICM Monitoring  Patient Name: Chad Christensen is a 54 y.o. male Date: 04/05/2020 Primary Care Physican: Daphine Deutscher Primary Cardiologist:Allred Electrophysiologist: Allred 4/30/2021Weight: 113lbs.  Spoke with patient and reports his only complaint is he has very poor balance and his quality of life is decreased because he cannot do the things he used to be able to do.  Denies fluid symptoms.  He uses a rollator to walk.  CorvueThoracic impedancenormal but was suggesting possible fluid accumulation from 4/19 - 4/26.  Prescribed:Furosemide 40 mg 1 tablet as needed.  Labs 06/02/2019 Creatinine 1.05, BUN 9, Potassium 4.9, Sodium 141, GFR >60  12/19/2018 Creatinine 0.76, BUN 10, Potassium 4.4, Sodium 138, GFR >60  Recommendations:No changes and encouraged to call if experiencing any fluid symptoms.  Follow-up plan: ICM clinic phone appointment on6/07/2020. 91 day device clinic remote transmission 05/13/2020.   Copy of ICM check sent to Dr.Allred.  3 month ICM trend: 04/02/2020    1 Year ICM trend:       Karie Soda, RN 04/05/2020 2:17 PM

## 2020-04-19 ENCOUNTER — Encounter (HOSPITAL_COMMUNITY): Payer: Medicare Other | Admitting: Cardiology

## 2020-04-26 ENCOUNTER — Telehealth: Payer: Self-pay | Admitting: *Deleted

## 2020-04-26 NOTE — Telephone Encounter (Signed)
Discuss possible BATwire study with patient again. Chad Christensen will mail him an information packet to review prior to his appt with Chad Christensen on Christensen.

## 2020-04-29 ENCOUNTER — Other Ambulatory Visit: Payer: Self-pay | Admitting: *Deleted

## 2020-04-29 DIAGNOSIS — Z006 Encounter for examination for normal comparison and control in clinical research program: Secondary | ICD-10-CM

## 2020-04-29 NOTE — Progress Notes (Signed)
orders

## 2020-05-08 ENCOUNTER — Telehealth: Payer: Self-pay | Admitting: Internal Medicine

## 2020-05-08 NOTE — Telephone Encounter (Signed)
Patient needs to know exactly where he is to go Friday for his test and what time.  He has been given two different addresses.

## 2020-05-10 ENCOUNTER — Encounter: Payer: Medicare Other | Admitting: *Deleted

## 2020-05-10 ENCOUNTER — Encounter (HOSPITAL_COMMUNITY): Payer: Self-pay | Admitting: Cardiology

## 2020-05-10 ENCOUNTER — Other Ambulatory Visit: Payer: Self-pay

## 2020-05-10 ENCOUNTER — Other Ambulatory Visit (HOSPITAL_COMMUNITY)
Admission: RE | Admit: 2020-05-10 | Discharge: 2020-05-10 | Disposition: A | Discharge status: Home / Self Care | Payer: Medicare Other | Source: Ambulatory Visit | Attending: Cardiology | Admitting: Cardiology

## 2020-05-10 ENCOUNTER — Ambulatory Visit (HOSPITAL_COMMUNITY)
Admission: RE | Admit: 2020-05-10 | Discharge: 2020-05-10 | Disposition: A | Discharge status: Home / Self Care | Payer: Medicare Other | Source: Ambulatory Visit | Attending: Cardiology | Admitting: Cardiology

## 2020-05-10 ENCOUNTER — Encounter: Payer: Self-pay | Admitting: *Deleted

## 2020-05-10 ENCOUNTER — Other Ambulatory Visit (HOSPITAL_COMMUNITY): Payer: Self-pay | Admitting: *Deleted

## 2020-05-10 ENCOUNTER — Ambulatory Visit (HOSPITAL_BASED_OUTPATIENT_CLINIC_OR_DEPARTMENT_OTHER)
Admission: RE | Admit: 2020-05-10 | Discharge: 2020-05-10 | Disposition: A | Discharge status: Home / Self Care | Payer: Medicare Other | Source: Ambulatory Visit

## 2020-05-10 ENCOUNTER — Ambulatory Visit (HOSPITAL_BASED_OUTPATIENT_CLINIC_OR_DEPARTMENT_OTHER)
Admission: RE | Admit: 2020-05-10 | Discharge: 2020-05-10 | Disposition: A | Discharge status: Home / Self Care | Payer: Medicare Other | Source: Ambulatory Visit | Attending: Cardiology | Admitting: Cardiology

## 2020-05-10 ENCOUNTER — Encounter (HOSPITAL_COMMUNITY): Payer: Medicare Other

## 2020-05-10 VITALS — BP 108/62 | HR 82 | Wt 107.1 lb

## 2020-05-10 VITALS — BP 108/62 | HR 82 | Wt 107.8 lb

## 2020-05-10 DIAGNOSIS — Z79899 Other long term (current) drug therapy: Secondary | ICD-10-CM | POA: Insufficient documentation

## 2020-05-10 DIAGNOSIS — Z8673 Personal history of transient ischemic attack (TIA), and cerebral infarction without residual deficits: Secondary | ICD-10-CM | POA: Insufficient documentation

## 2020-05-10 DIAGNOSIS — M109 Gout, unspecified: Secondary | ICD-10-CM | POA: Diagnosis not present

## 2020-05-10 DIAGNOSIS — I110 Hypertensive heart disease with heart failure: Secondary | ICD-10-CM | POA: Diagnosis not present

## 2020-05-10 DIAGNOSIS — Z006 Encounter for examination for normal comparison and control in clinical research program: Secondary | ICD-10-CM

## 2020-05-10 DIAGNOSIS — Z8249 Family history of ischemic heart disease and other diseases of the circulatory system: Secondary | ICD-10-CM | POA: Diagnosis not present

## 2020-05-10 DIAGNOSIS — Z20822 Contact with and (suspected) exposure to covid-19: Secondary | ICD-10-CM | POA: Insufficient documentation

## 2020-05-10 DIAGNOSIS — Z87891 Personal history of nicotine dependence: Secondary | ICD-10-CM | POA: Insufficient documentation

## 2020-05-10 DIAGNOSIS — I472 Ventricular tachycardia: Secondary | ICD-10-CM | POA: Insufficient documentation

## 2020-05-10 DIAGNOSIS — Z86010 Personal history of colonic polyps: Secondary | ICD-10-CM | POA: Diagnosis not present

## 2020-05-10 DIAGNOSIS — Z7984 Long term (current) use of oral hypoglycemic drugs: Secondary | ICD-10-CM | POA: Diagnosis not present

## 2020-05-10 DIAGNOSIS — I5022 Chronic systolic (congestive) heart failure: Secondary | ICD-10-CM | POA: Diagnosis present

## 2020-05-10 DIAGNOSIS — I420 Dilated cardiomyopathy: Secondary | ICD-10-CM | POA: Insufficient documentation

## 2020-05-10 DIAGNOSIS — Z95810 Presence of automatic (implantable) cardiac defibrillator: Secondary | ICD-10-CM | POA: Diagnosis not present

## 2020-05-10 DIAGNOSIS — I480 Paroxysmal atrial fibrillation: Secondary | ICD-10-CM | POA: Diagnosis not present

## 2020-05-10 DIAGNOSIS — Z7901 Long term (current) use of anticoagulants: Secondary | ICD-10-CM | POA: Insufficient documentation

## 2020-05-10 LAB — COMPREHENSIVE METABOLIC PANEL
ALT: 18 U/L (ref 0–44)
AST: 16 U/L (ref 15–41)
Albumin: 4.3 g/dL (ref 3.5–5.0)
Alkaline Phosphatase: 41 U/L (ref 38–126)
Anion gap: 12 (ref 5–15)
BUN: 15 mg/dL (ref 6–20)
CO2: 23 mmol/L (ref 22–32)
Calcium: 9.5 mg/dL (ref 8.9–10.3)
Chloride: 104 mmol/L (ref 98–111)
Creatinine, Ser: 0.97 mg/dL (ref 0.61–1.24)
GFR calc Af Amer: >60 mL/min (ref >60)
GFR calc non Af Amer: >60 mL/min (ref >60)
Glucose, Bld: 91 mg/dL (ref 70–99)
Potassium: 4.3 mmol/L (ref 3.5–5.1)
Sodium: 139 mmol/L (ref 135–145)
Total Bilirubin: 1.2 mg/dL (ref 0.3–1.2)
Total Protein: 7.4 g/dL (ref 6.5–8.1)

## 2020-05-10 LAB — CBC
HCT: 45.6 % (ref 39.0–52.0)
Hemoglobin: 15.2 g/dL (ref 13.0–17.0)
MCH: 32.9 pg (ref 26.0–34.0)
MCHC: 33.3 g/dL (ref 30.0–36.0)
MCV: 98.7 fL (ref 80.0–100.0)
Platelets: 234 10*3/uL (ref 150–400)
RBC: 4.62 MIL/uL (ref 4.22–5.81)
RDW: 12.9 % (ref 11.5–15.5)
WBC: 10.4 10*3/uL (ref 4.0–10.5)
nRBC: 0 % (ref 0.0–0.2)

## 2020-05-10 LAB — DIGOXIN LEVEL: Digoxin Level: 0.9 ng/mL (ref 0.8–2.0)

## 2020-05-10 LAB — PROTIME-INR
INR: 1.9 — ABNORMAL HIGH (ref 0.8–1.2)
Prothrombin Time: 20.7 seconds — ABNORMAL HIGH (ref 11.4–15.2)

## 2020-05-10 LAB — SARS CORONAVIRUS 2 (TAT 6-24 HRS): SARS Coronavirus 2: NEGATIVE

## 2020-05-10 MED ORDER — SODIUM CHLORIDE 0.9% FLUSH: 3.0000 mL | Freq: Two times a day (BID) | INTRAVENOUS | Status: DC

## 2020-05-10 MED ORDER — DAPAGLIFLOZIN PROPANEDIOL 10 MG PO TABS: 10.0000 mg | ORAL_TABLET | Freq: Every day | ORAL | 6 refills | Status: DC

## 2020-05-10 NOTE — Progress Notes (Signed)
CSW consulted to help with transport to CPX and Heart Cath next week.  Pt reports he only has is cousin and his aunt who are around and can help.  His cousin will be out of town next week on vacation and his aunt is elderly and unable to drive long distances so he doesn't have a way to get to his appts next week.  CSW confirmed with [facility] transport they can pick up despite pt being 1 hour away- CSW will arrange and touch base with pt next week to confirm  Burna Sis, LCSW Clinical Social Worker [facility] Desk#: [phone] Cell#: [phone]

## 2020-05-10 NOTE — Progress Notes (Signed)
Carotid artery duplex completed. Refer to "CV Proc" under chart review to view preliminary results.  05/10/2020 1:08 PM Eula Fried., MHA, RVT, RDCS, RDMS

## 2020-05-10 NOTE — Progress Notes (Signed)
  Echocardiogram 2D Echocardiogram has been performed.  Delcie Roch 05/10/2020, 10:35 AM

## 2020-05-10 NOTE — Progress Notes (Signed)
BatWire pt 009 screening

## 2020-05-10 NOTE — Patient Instructions (Signed)
Start Farxiga 10 mg daily  Labs done today, we will call you for abnormal results  Your physician has recommended that you have a cardiopulmonary stress test (CPX). CPX testing is a non-invasive measurement of heart and lung function. It replaces a traditional treadmill stress test. This type of test provides a tremendous amount of information that relates not only to your present condition but also for future outcomes. This test combines measurements of you ventilation, respiratory gas exchange in the lungs, electrocardiogram (EKG), blood pressure and physical response before, during, and following an exercise protocol.  Heart Catheterization, see instructions below  Your physician recommends that you schedule a follow-up appointment in: 1 month  PLEASE GO TODAY FOR YOUR COVID TEST, SEE ATTACHED DIRECTION SHEET  TEST ARE SCHEDULED FOR Wednesday 05/15/20   PLEASE ARRIVE TO THE [facility]ART & VASCULAR CENTER AT 8:45 AM FOR YOUR STRESS TEST AFTER THIS TEST IS DONE WE WILL TAKE YOU UP TO THE SHORT STAY CENTER FOR YOUR HEART CATHETERIZATION  1. ARRIVE AT 8:45 AM  2. Diet: Do not eat solid foods after midnight.  The patient may have clear liquids until 5am upon the day of the procedure.  3. COVID TEST NEEDED TODAY  4. Medication instructions in preparation for your procedure:   DO NOT TAKE COUMADIN ON Tuesday 6/8 PM   Wednesday 6/9 AM DO NOT TAKE:        FARXIGA, FUROSEMIDE OR SPIRONOLACTONE  On the morning of your procedure, take any morning medicines NOT listed above.  You may use sips of water.  5. Plan for one night stay--bring personal belongings. 6. Bring a current list of your medications and current insurance cards. 7. You MUST have a responsible person to drive you home. 8. Someone MUST be with you the first 24 hours after you arrive home or your discharge will be delayed. 9. Please wear clothes that are easy to get on and off and wear slip-on shoes.  Thank you for allowing [place] to care  for you!   -- [facility]

## 2020-05-12 NOTE — Progress Notes (Signed)
PCP: Betti Cruz, PA-C EP: Dr. [name] Cardiology: Dr. Shirlee Latch  54 y.o. with history of nonischemic dilated cardiomyopathy and paroxysmal atrial fibrillation presents for followup of CHF.  Patient lives in [place], [place].  He gets sporadic cardiology care as it is hard for him to get a ride to [place] and he does not want to see any of the cardiologists in [place]. He is followed by Dr. [name] for his [facility] ICD.  I saw him initially in 3/19, when he was referred by Dr. [name] for left/right heart cath.  This showed no coronary disease and low but not markedly low cardiac output (CI 2.22).  Echo in 3/19 showed a severely dilated LV with EF 20%.    Echo was done today and reviewed, EF <20%, severe LV dilation, normal RV size with mildly decreased systolic function, IVC normal.   He returns for followup of CHF.  I have not seen him for over a year.  He walks with a cane or walker chronically due to arthritis (neck and back pain, has had back surgery) and poor balance with prior CVA.  He quit smoking completely about 3 months ago. He was doing well up until about 3 months ago.  Since then, he has developed increased dyspnea and fatigue with exertion. Currently, he cannot walk 50 yards without getting tired and stopping.  Feels like he can't keep his train of thought, does not feel as mentally clear.  No chest pain or palpitations.  No orthopnea/PND.  No lightheadedness.  At last appointment, I had started him on losartan.  He is not on it any longer and does not remember why he stopped it.  He has had trouble tolerating meds due to orthostatic symptoms.   He has talked with Dr. [name] about a barostimulation device implantation.   ECG (personally reviewed): NSR, IVCD 140 msec  St Jude device interrogation: Stable thoracic impedance, no VT.   Labs (1/20): digoxin 0.6, K 4.4, creatinine 0.76 LabS (6/20): digoxin 0.6, K 4.9, creatinine 1.05  PMH: 1. H/o VT 2. H/o GI bleeding 3. Colon  polyps 4. Remote substance abuse. 5. HTN 6. Gout 7. Atrial fibrillation: Paroxysmal.  8. ?COPD: Smokes cigars.  9. Chronic systolic CHF: Nonischemic cardiomyopathy.  St Jude ICD.  - Echo (3/19): EF 20%, severe LV dilation, diffuse hypokinesis, mild-moderate MR, low normal RV function.  - LHC/RHC (3/19): No CAD; mean RA 2, PA 24/8, mean PCWP 13, CI 2.2.  - Echo (6/21): EF <20%, severe LV dilation, normal RV size with mildly decreased systolic function, IVC normal.  10. Prior CVA: Likely related to atrial fibrillation.   10. Back surgery.   Social History   Socioeconomic History  . Marital status: Single    Spouse name: Not on file  . Number of children: Not on file  . Years of education: Not on file  . Highest education level: Not on file  Occupational History  . Occupation: Disability  Tobacco Use  . Smoking status: Light Tobacco Smoker    Packs/day: 0.30    Years: 25.00    Pack years: 7.50    Types: Cigars    Start date: 09/13/1982    Last attempt to quit: 12/07/2017    Years since quitting: 2.4  . Smokeless tobacco: Never Used  Substance and Sexual Activity  . Alcohol use: Yes    Alcohol/week: 2.0 - 3.0 standard drinks    Types: 2 - 3 Cans of beer per week  . 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 Sexual activity: Not on file  Other Topics Concern  . Not on file  Social History Narrative   Lives in [place], [place].   Went to [employer] for 2.5 years.   Social Determinants of Health   Financial Resource Strain:   . Difficulty of Paying Living Expenses:   Food Insecurity:   . Worried About Running Out of Food in the Last Year:   . Ran Out of Food in the Last Year:   Transportation Needs:   . Lack of Transportation (Medical):   . Lack of Transportation (Non-Medical):   Physical Activity:   . Days of Exercise per Week:   . Minutes of Exercise per Session:   Stress:   . Feeling of Stress :   Social Connections:   .  Frequency of Communication with Friends and Family:   . Frequency of Social Gatherings with Friends and Family:   . Attends Religious Services:   . Active Member of Clubs or Organizations:   . Attends Club or Organization Meetings:   . Marital Status:   Intimate Partner Violence:   . Fear of Current or Ex-Partner:   . Emotionally Abused:   . Physically Abused:   . Sexually Abused:    Family History  Problem Relation Age of Onset  . Cardiomyopathy Brother        Dilated   ROS: All systems reviewed and negative except as per HPI.   Current Outpatient Medications  Medication Sig Dispense Refill  . albuterol (ACCUNEB) 1.25 MG/3ML nebulizer solution Take 1.25 mg by nebulization every 6 (six) hours as needed for wheezing.     . amiodarone (PACERONE) 200 MG tablet Take 1 tablet (200 mg total) by mouth daily. 90 tablet 1  . clonazePAM (KLONOPIN) 0.5 MG tablet Take 0.5 mg by mouth 2 (two) times daily.     . COLCRYS 0.6 MG tablet Take 0.6 mg by mouth 2 (two) times daily as needed (for gout flare up).     . digoxin (LANOXIN) 0.125 MG tablet Take 1 tablet (0.125 mg total) by mouth daily. Telehealth visit 12/2019 with Dr. Allred. 90 tablet 1  . furosemide (LASIX) 40 MG tablet Take 40 mg by mouth daily as needed for fluid.     . mirtazapine (REMERON) 15 MG tablet Take 15 mg by mouth at bedtime.    . nystatin (MYCOSTATIN) 100000 UNIT/ML suspension Take 5 mLs by mouth daily as needed (thrush).     . pramipexole (MIRAPEX) 0.125 MG tablet Take 0.125 mg by mouth 3 (three) times daily.     . rOPINIRole (REQUIP) 1 MG tablet Take 1 mg by mouth 3 (three) times daily.    . simvastatin (ZOCOR) 5 MG tablet Take 5 mg by mouth daily.    . spironolactone (ALDACTONE) 25 MG tablet Take 1 tablet (25 mg total) by mouth daily. *Patient is overdue for an appointment and needs to call and schedule for further refills* (Patient taking differently: Take 25 mg by mouth daily. ) 15 tablet 0  . dapagliflozin propanediol  (FARXIGA) 10 MG TABS tablet Take 1 tablet (10 mg total) by mouth daily before breakfast. 30 tablet 6   No current facility-administered medications for this encounter.   BP 108/62   Pulse 82   Wt 48.9 kg (107 lb 12.8 oz)   SpO2 98%   BMI 14.62 kg/m  General: NAD, thin Neck: No   JVD, no thyromegaly or thyroid nodule.  Lungs: Clear to auscultation bilaterally with normal respiratory effort. CV: Nondisplaced PMI.  Heart regular S1/S2, +S3, no murmur.  No peripheral edema.  No carotid bruit.  Normal pedal pulses.  Abdomen: Soft, nontender, no hepatosplenomegaly, no distention.  Skin: Intact without lesions or rashes.  Neurologic: Alert and oriented x 3.  Psych: Normal affect. Extremities: No clubbing or cyanosis.  HEENT: Normal.   Assessment/Plan: 1. Chronic systolic CHF: Echo today with EF <20%, severe LV dilation, normal RV size with mildly decreased systolic function, IVC normal.  LHC/RHC in 3/20 with no coronary disease, relatively preserved cardiac output.  Nonischemic cardiomyopathy with St Jude ICD.  Probably not CRT candidate, has IVCD but not classic LBBB. He had been reasonably well-compensated for years, difficulty taking HF meds due to orthostatic symptoms.  However, recently has developed worsening symptoms now NYHA class III. He is not volume overloaded by exam or Corvue, I am concerned for low output HF.  - He takes Lasix prn.  - Continue digoxin 0.125 daily, check level today.  - Continue spironolactone 25 mg daily.  - Add dapagliflozin 10 mg daily.  - Apparently did not tolerate losartan 12.5 daily after last appointment.  - I will arrange for RHC to assess filling pressures and cardiac output.  We discussed risks/benefits and he agrees to procedure.  - I will arrange for CPX.   - Will start evaluation for barostimulation device.  - I suspect he is nearing the need for advanced therapies, will see what CPX and RHC show.  This is going to be difficult, he is limited in terms  of transplantation.  Stopped smoking within the last 3 months and has transportation issues, so suspect transplant is not going to be a great option.  Will need to explore possibility of LVAD placement, but again would have to be able to get back and forth to clinic. Barostimulation device will be an option to try to get some symptomatic improvement.  2. Smoking/?COPD: Patient quit smoking about 3 months ago.   - Will get PFTs with CPX.  3. H/o VT: He is on amiodarone.  - Check LFTs, TSH.  He should get a regular eye exam.  4. Atrial fibrillation: Paroxysmal.  - Continue warfarin.  Followup 3-4 wks (after cath and CPX).   Loralie Champagne 05/12/2020

## 2020-05-12 NOTE — H&P (View-Only) (Signed)
PCP: Betti Cruz, PA-C EP: Dr. [name] Cardiology: Dr. Shirlee Latch  54 y.o. with history of nonischemic dilated cardiomyopathy and paroxysmal atrial fibrillation presents for followup of CHF.  Patient lives in [place], [place].  He gets sporadic cardiology care as it is hard for him to get a ride to [place] and he does not want to see any of the cardiologists in [place]. He is followed by Dr. [name] for his [facility] ICD.  I saw him initially in 3/19, when he was referred by Dr. [name] for left/right heart cath.  This showed no coronary disease and low but not markedly low cardiac output (CI 2.22).  Echo in 3/19 showed a severely dilated LV with EF 20%.    Echo was done today and reviewed, EF <20%, severe LV dilation, normal RV size with mildly decreased systolic function, IVC normal.   He returns for followup of CHF.  I have not seen him for over a year.  He walks with a cane or walker chronically due to arthritis (neck and back pain, has had back surgery) and poor balance with prior CVA.  He quit smoking completely about 3 months ago. He was doing well up until about 3 months ago.  Since then, he has developed increased dyspnea and fatigue with exertion. Currently, he cannot walk 50 yards without getting tired and stopping.  Feels like he can't keep his train of thought, does not feel as mentally clear.  No chest pain or palpitations.  No orthopnea/PND.  No lightheadedness.  At last appointment, I had started him on losartan.  He is not on it any longer and does not remember why he stopped it.  He has had trouble tolerating meds due to orthostatic symptoms.   He has talked with Dr. [name] about a barostimulation device implantation.   ECG (personally reviewed): NSR, IVCD 140 msec  St Jude device interrogation: Stable thoracic impedance, no VT.   Labs (1/20): digoxin 0.6, K 4.4, creatinine 0.76 LabS (6/20): digoxin 0.6, K 4.9, creatinine 1.05  PMH: 1. H/o VT 2. H/o GI bleeding 3. Colon  polyps 4. Remote substance abuse. 5. HTN 6. Gout 7. Atrial fibrillation: Paroxysmal.  8. ?COPD: Smokes cigars.  9. Chronic systolic CHF: Nonischemic cardiomyopathy.  St Jude ICD.  - Echo (3/19): EF 20%, severe LV dilation, diffuse hypokinesis, mild-moderate MR, low normal RV function.  - LHC/RHC (3/19): No CAD; mean RA 2, PA 24/8, mean PCWP 13, CI 2.2.  - Echo (6/21): EF <20%, severe LV dilation, normal RV size with mildly decreased systolic function, IVC normal.  10. Prior CVA: Likely related to atrial fibrillation.   10. Back surgery.   Social History   Socioeconomic History  . Marital status: Single    Spouse name: Not on file  . Number of children: Not on file  . Years of education: Not on file  . Highest education level: Not on file  Occupational History  . Occupation: Disability  Tobacco Use  . Smoking status: Light Tobacco Smoker    Packs/day: 0.30    Years: 25.00    Pack years: 7.50    Types: Cigars    Start date: 09/13/1982    Last attempt to quit: 12/07/2017    Years since quitting: 2.4  . Smokeless tobacco: Never Used  Substance and Sexual Activity  . Alcohol use: Yes    Alcohol/week: 2.0 - 3.0 standard drinks    Types: 2 - 3 Cans of beer per week  . 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 Sexual activity: Not on file  Other Topics Concern  . Not on file  Social History Narrative   Lives in [place], [place].   Went to [employer] for 2.5 years.   Social Determinants of Health   Financial Resource Strain:   . Difficulty of Paying Living Expenses:   Food Insecurity:   . Worried About Programme researcher, broadcasting/film/video in the Last Year:   . Barista in the Last Year:   Transportation Needs:   . Freight forwarder (Medical):   Marland Kitchen Lack of Transportation (Non-Medical):   Physical Activity:   . Days of Exercise per Week:   . Minutes of Exercise per Session:   Stress:   . Feeling of Stress :   Social Connections:   .  Frequency of Communication with Friends and Family:   . Frequency of Social Gatherings with Friends and Family:   . Attends Religious Services:   . Active Member of Clubs or Organizations:   . Attends Banker Meetings:   Marland Kitchen Marital Status:   Intimate Partner Violence:   . Fear of Current or Ex-Partner:   . Emotionally Abused:   Marland Kitchen Physically Abused:   . Sexually Abused:    Family History  Problem Relation Age of Onset  . Cardiomyopathy Brother        Dilated   ROS: All systems reviewed and negative except as per HPI.   Current Outpatient Medications  Medication Sig Dispense Refill  . albuterol (ACCUNEB) 1.25 MG/3ML nebulizer solution Take 1.25 mg by nebulization every 6 (six) hours as needed for wheezing.     Marland Kitchen amiodarone (PACERONE) 200 MG tablet Take 1 tablet (200 mg total) by mouth daily. 90 tablet 1  . clonazePAM (KLONOPIN) 0.5 MG tablet Take 0.5 mg by mouth 2 (two) times daily.     Marland Kitchen COLCRYS 0.6 MG tablet Take 0.6 mg by mouth 2 (two) times daily as needed (for gout flare up).     . digoxin (LANOXIN) 0.125 MG tablet Take 1 tablet (0.125 mg total) by mouth daily. Telehealth visit 12/2019 with Dr. [name]. 90 tablet 1  . furosemide (LASIX) 40 MG tablet Take 40 mg by mouth daily as needed for fluid.     . mirtazapine (REMERON) 15 MG tablet Take 15 mg by mouth at bedtime.    Marland Kitchen nystatin (MYCOSTATIN) 100000 UNIT/ML suspension Take 5 mLs by mouth daily as needed (thrush).     . pramipexole (MIRAPEX) 0.125 MG tablet Take 0.125 mg by mouth 3 (three) times daily.     Marland Kitchen rOPINIRole (REQUIP) 1 MG tablet Take 1 mg by mouth 3 (three) times daily.    . simvastatin (ZOCOR) 5 MG tablet Take 5 mg by mouth daily.    Marland Kitchen spironolactone (ALDACTONE) 25 MG tablet Take 1 tablet (25 mg total) by mouth daily. *Patient is overdue for an appointment and needs to call and schedule for further refills* (Patient taking differently: Take 25 mg by mouth daily. ) 15 tablet 0  . dapagliflozin propanediol  (FARXIGA) 10 MG TABS tablet Take 1 tablet (10 mg total) by mouth daily before breakfast. 30 tablet 6   No current facility-administered medications for this encounter.   BP 108/62   Pulse 82   Wt 48.9 kg (107 lb 12.8 oz)   SpO2 98%   BMI 14.62 kg/m  General: NAD, thin Neck: No  JVD, no thyromegaly or thyroid nodule.  Lungs: Clear to auscultation bilaterally with normal respiratory effort. CV: Nondisplaced PMI.  Heart regular S1/S2, +S3, no murmur.  No peripheral edema.  No carotid bruit.  Normal pedal pulses.  Abdomen: Soft, nontender, no hepatosplenomegaly, no distention.  Skin: Intact without lesions or rashes.  Neurologic: Alert and oriented x 3.  Psych: Normal affect. Extremities: No clubbing or cyanosis.  HEENT: Normal.   Assessment/Plan: 1. Chronic systolic CHF: Echo today with EF <20%, severe LV dilation, normal RV size with mildly decreased systolic function, IVC normal.  LHC/RHC in 3/20 with no coronary disease, relatively preserved cardiac output.  Nonischemic cardiomyopathy with St Jude ICD.  Probably not CRT candidate, has IVCD but not classic LBBB. He had been reasonably well-compensated for years, difficulty taking HF meds due to orthostatic symptoms.  However, recently has developed worsening symptoms now NYHA class III. He is not volume overloaded by exam or Corvue, I am concerned for low output HF.  - He takes Lasix prn.  - Continue digoxin 0.125 daily, check level today.  - Continue spironolactone 25 mg daily.  - Add dapagliflozin 10 mg daily.  - Apparently did not tolerate losartan 12.5 daily after last appointment.  - I will arrange for RHC to assess filling pressures and cardiac output.  We discussed risks/benefits and he agrees to procedure.  - I will arrange for CPX.   - Will start evaluation for barostimulation device.  - I suspect he is nearing the need for advanced therapies, will see what CPX and RHC show.  This is going to be difficult, he is limited in terms  of transplantation.  Stopped smoking within the last 3 months and has transportation issues, so suspect transplant is not going to be a great option.  Will need to explore possibility of LVAD placement, but again would have to be able to get back and forth to clinic. Barostimulation device will be an option to try to get some symptomatic improvement.  2. Smoking/?COPD: Patient quit smoking about 3 months ago.   - Will get PFTs with CPX.  3. H/o VT: He is on amiodarone.  - Check LFTs, TSH.  He should get a regular eye exam.  4. Atrial fibrillation: Paroxysmal.  - Continue warfarin.  Followup 3-4 wks (after cath and CPX).   Loralie Champagne 05/12/2020

## 2020-05-13 ENCOUNTER — Ambulatory Visit (INDEPENDENT_AMBULATORY_CARE_PROVIDER_SITE_OTHER): Payer: Medicare Other | Admitting: *Deleted

## 2020-05-13 ENCOUNTER — Telehealth: Payer: Self-pay | Admitting: Physician Assistant

## 2020-05-13 ENCOUNTER — Telehealth (HOSPITAL_COMMUNITY): Payer: Self-pay | Admitting: Licensed Clinical Social Worker

## 2020-05-13 DIAGNOSIS — I420 Dilated cardiomyopathy: Secondary | ICD-10-CM

## 2020-05-13 LAB — CUP PACEART REMOTE DEVICE CHECK
Battery Remaining Longevity: 11 mo
Battery Remaining Percentage: 9 %
Battery Voltage: 2.68 V
Brady Statistic RV Percent Paced: 1 %
Date Time Interrogation Session: 20210607021101
HighPow Impedance: 88 Ohm
HighPow Impedance: 88 Ohm
Implantable Lead Implant Date: 20110909
Implantable Lead Location: 753860
Implantable Lead Model: 7122
Implantable Pulse Generator Implant Date: 20110909
Lead Channel Impedance Value: 460 Ohm
Lead Channel Pacing Threshold Amplitude: 0.5 V
Lead Channel Pacing Threshold Pulse Width: 0.5 ms
Lead Channel Sensing Intrinsic Amplitude: 11.8 mV
Lead Channel Setting Pacing Amplitude: 2.5 V
Lead Channel Setting Pacing Pulse Width: 0.5 ms
Lead Channel Setting Sensing Sensitivity: 0.5 mV
Pulse Gen Serial Number: 614505

## 2020-05-13 NOTE — Telephone Encounter (Signed)
CSW called pt and confirmed transport plans for Wednesday- pick up scheduled for 7:15am.  Official transport request sent to [facility] Transportation services to arrange.  CSW will continue to follow and assist as needed  Burna Sis, LCSW Clinical Social Worker [facility] Desk#: [phone] Cell#: [phone]

## 2020-05-13 NOTE — Telephone Encounter (Signed)
   Chad Christensen DOB: 1966-06-09 MRN: [id number]   RIDER WAIVER AND RELEASE OF LIABILITY  For purposes of improving physical access to our facilities, [facility] is pleased to partner with third parties to provide [facility] patients or other authorized individuals the option of convenient, on-demand ground transportation services (the Chiropractor") through use of the technology service that enables users to request on-demand ground transportation from independent third-party providers.  By opting to use and accept these [employer], I, the undersigned, hereby agree on behalf of myself, and on behalf of any minor child using the [employer] for whom I am the parent or legal guardian, as follows:  1. Science writer provided to me are provided by independent third-party transportation providers who are not [employer] or employees and who are unaffiliated with [employer]. 2. [facility] is neither a transportation carrier nor a common or public carrier. 3. [place] has no control over the quality or safety of the transportation that occurs as a result of the [employer]. 4. [facility] cannot guarantee that any third-party transportation provider will complete any arranged transportation service. 5. [facility] makes no representation, warranty, or guarantee regarding the reliability, timeliness, quality, safety, suitability, or availability of any of the Transport Services or that they will be error free. 6. I fully understand that traveling by vehicle involves risks and dangers of serious bodily injury, including permanent disability, paralysis, and death. I agree, on behalf of myself and on behalf of any minor child using the Transport Services for whom I am the parent or legal guardian, that the entire risk arising out of my use of the [employer] remains solely with me, to the maximum extent permitted under applicable law. 7. The [employer] are provided "as is" and "as available." [place] disclaims all representations and warranties, express, implied or statutory, not expressly set out in these terms, including the implied warranties of merchantability and fitness for a particular purpose. 8. I hereby waive and release [place], its agents, employees, officers, directors, representatives, insurers, attorneys, assigns, successors, subsidiaries, and affiliates from any and all past, present, or future claims, demands, liabilities, actions, causes of action, or suits of any kind directly or indirectly arising from acceptance and use of the [employer]. 9. I further waive and release [place] and its affiliates from all present and future liability and responsibility for any injury or death to persons or damages to property caused by or related to the use of the [employer]. 10. I have read this Waiver and Release of Liability, and I understand the terms used in it and their legal significance. This Waiver is freely and voluntarily given with the understanding that my right (as well as the right of any minor child for whom I am the parent or legal guardian using the [employer]) to legal recourse against [place] in connection with the [employer] is knowingly surrendered in return for use of these services.   I attest that I read the consent document to Chad Christensen, gave Mr. Peddie the opportunity to ask questions and answered the questions asked (if any). I affirm that Chad Christensen then provided consent for he's participation in this program.     Launa Grill

## 2020-05-14 ENCOUNTER — Ambulatory Visit (INDEPENDENT_AMBULATORY_CARE_PROVIDER_SITE_OTHER): Payer: Medicare Other

## 2020-05-14 DIAGNOSIS — I5022 Chronic systolic (congestive) heart failure: Secondary | ICD-10-CM

## 2020-05-14 DIAGNOSIS — Z95810 Presence of automatic (implantable) cardiac defibrillator: Secondary | ICD-10-CM | POA: Diagnosis not present

## 2020-05-15 ENCOUNTER — Encounter (HOSPITAL_COMMUNITY)
Admission: RE | Disposition: A | Discharge status: Home / Self Care | Payer: Self-pay | Source: Home / Self Care | Attending: Cardiology

## 2020-05-15 ENCOUNTER — Other Ambulatory Visit: Payer: Self-pay

## 2020-05-15 ENCOUNTER — Encounter: Payer: Self-pay | Admitting: *Deleted

## 2020-05-15 ENCOUNTER — Other Ambulatory Visit (HOSPITAL_COMMUNITY): Payer: Self-pay | Admitting: *Deleted

## 2020-05-15 ENCOUNTER — Ambulatory Visit (HOSPITAL_COMMUNITY): Payer: Medicare Other

## 2020-05-15 ENCOUNTER — Ambulatory Visit (HOSPITAL_COMMUNITY)
Admission: RE | Admit: 2020-05-15 | Discharge: 2020-05-15 | Disposition: A | Discharge status: Home / Self Care | Payer: Medicare Other | Attending: Cardiology | Admitting: Cardiology

## 2020-05-15 DIAGNOSIS — Z006 Encounter for examination for normal comparison and control in clinical research program: Secondary | ICD-10-CM

## 2020-05-15 DIAGNOSIS — Z8673 Personal history of transient ischemic attack (TIA), and cerebral infarction without residual deficits: Secondary | ICD-10-CM | POA: Diagnosis not present

## 2020-05-15 DIAGNOSIS — Z95810 Presence of automatic (implantable) cardiac defibrillator: Secondary | ICD-10-CM | POA: Insufficient documentation

## 2020-05-15 DIAGNOSIS — Z7901 Long term (current) use of anticoagulants: Secondary | ICD-10-CM | POA: Diagnosis not present

## 2020-05-15 DIAGNOSIS — I5022 Chronic systolic (congestive) heart failure: Secondary | ICD-10-CM | POA: Diagnosis not present

## 2020-05-15 DIAGNOSIS — I428 Other cardiomyopathies: Secondary | ICD-10-CM | POA: Insufficient documentation

## 2020-05-15 DIAGNOSIS — I110 Hypertensive heart disease with heart failure: Secondary | ICD-10-CM | POA: Diagnosis not present

## 2020-05-15 DIAGNOSIS — I509 Heart failure, unspecified: Secondary | ICD-10-CM

## 2020-05-15 DIAGNOSIS — I480 Paroxysmal atrial fibrillation: Secondary | ICD-10-CM | POA: Diagnosis not present

## 2020-05-15 DIAGNOSIS — M109 Gout, unspecified: Secondary | ICD-10-CM | POA: Insufficient documentation

## 2020-05-15 DIAGNOSIS — Z87891 Personal history of nicotine dependence: Secondary | ICD-10-CM | POA: Insufficient documentation

## 2020-05-15 DIAGNOSIS — Z79899 Other long term (current) drug therapy: Secondary | ICD-10-CM | POA: Diagnosis not present

## 2020-05-15 HISTORY — PX: RIGHT HEART CATH: CATH118263

## 2020-05-15 LAB — POCT I-STAT EG7
Acid-Base Excess: 1 mmol/L (ref 0.0–2.0)
Acid-Base Excess: 1 mmol/L (ref 0.0–2.0)
Bicarbonate: 26.3 mmol/L (ref 20.0–28.0)
Bicarbonate: 26.5 mmol/L (ref 20.0–28.0)
Calcium, Ion: 1.25 mmol/L (ref 1.15–1.40)
Calcium, Ion: 1.28 mmol/L (ref 1.15–1.40)
HCT: 42 % (ref 39.0–52.0)
HCT: 42 % (ref 39.0–52.0)
Hemoglobin: 14.3 g/dL (ref 13.0–17.0)
Hemoglobin: 14.3 g/dL (ref 13.0–17.0)
O2 Saturation: 71 %
O2 Saturation: 74 %
Potassium: 4 mmol/L (ref 3.5–5.1)
Potassium: 4 mmol/L (ref 3.5–5.1)
Sodium: 139 mmol/L (ref 135–145)
Sodium: 140 mmol/L (ref 135–145)
TCO2: 28 mmol/L (ref 22–32)
TCO2: 28 mmol/L (ref 22–32)
pCO2, Ven: 43.4 mmHg — ABNORMAL LOW (ref 44.0–60.0)
pCO2, Ven: 43.5 mmHg — ABNORMAL LOW (ref 44.0–60.0)
pH, Ven: 7.39 (ref 7.250–7.430)
pH, Ven: 7.393 (ref 7.250–7.430)
pO2, Ven: 38 mmHg (ref 32.0–45.0)
pO2, Ven: 40 mmHg (ref 32.0–45.0)

## 2020-05-15 SURGERY — RIGHT HEART CATH
Anesthesia: LOCAL

## 2020-05-15 MED ORDER — ASPIRIN 81 MG PO CHEW
81.0000 mg | CHEWABLE_TABLET | ORAL | Status: AC
  Administered 2020-05-15: 81 mg via ORAL
  Filled 2020-05-15: qty 1

## 2020-05-15 MED ORDER — LIDOCAINE HCL (PF) 1 % IJ SOLN
INTRAMUSCULAR | Status: DC | PRN
  Administered 2020-05-15: 2 mL

## 2020-05-15 MED ORDER — SODIUM CHLORIDE 0.9% FLUSH: 3.0000 mL | INTRAVENOUS | Status: DC | PRN

## 2020-05-15 MED ORDER — SODIUM CHLORIDE 0.9 % IV SOLN: 250.0000 mL | INTRAVENOUS | Status: DC | PRN

## 2020-05-15 MED ORDER — HEPARIN (PORCINE) IN NACL 1000-0.9 UT/500ML-% IV SOLN
INTRAVENOUS | Status: AC
  Filled 2020-05-15: qty 500

## 2020-05-15 MED ORDER — HEPARIN (PORCINE) IN NACL 1000-0.9 UT/500ML-% IV SOLN
INTRAVENOUS | Status: DC | PRN
  Administered 2020-05-15: 500 mL

## 2020-05-15 MED ORDER — SODIUM CHLORIDE 0.9 % IV SOLN: INTRAVENOUS | Status: DC

## 2020-05-15 MED ORDER — SODIUM CHLORIDE 0.9% FLUSH: 3.0000 mL | Freq: Two times a day (BID) | INTRAVENOUS | Status: DC

## 2020-05-15 MED ORDER — LIDOCAINE HCL (PF) 1 % IJ SOLN
INTRAMUSCULAR | Status: AC
  Filled 2020-05-15: qty 30

## 2020-05-15 SURGICAL SUPPLY — 5 items
CATH SWAN GANZ 7F STRAIGHT (CATHETERS) ×1 IMPLANT
GLIDESHEATH SLENDER 7FR .021G (SHEATH) ×1 IMPLANT
KIT HEART LEFT (KITS) ×2 IMPLANT
PACK CARDIAC CATHETERIZATION (CUSTOM PROCEDURE TRAY) ×2 IMPLANT
TRANSDUCER W/STOPCOCK (MISCELLANEOUS) ×2 IMPLANT

## 2020-05-15 NOTE — Progress Notes (Signed)
Pt ready for discharge home. [facility] unable to pick patient up until around 1500. To call when arrive.

## 2020-05-15 NOTE — Progress Notes (Signed)
Remote ICD transmission.   

## 2020-05-15 NOTE — Discharge Instructions (Signed)
Call Dr. Alford Highland office if right antecubital area becomes red, warm, swollen or has new or odorous drainage.

## 2020-05-15 NOTE — Interval H&P Note (Signed)
History and Physical Interval Note:  05/15/2020 1:12 PM  Chad Christensen  has presented today for surgery, with the diagnosis of heart failure.  The various methods of treatment have been discussed with the patient and family. After consideration of risks, benefits and other options for treatment, the patient has consented to  Procedure(s): RIGHT HEART CATH (N/A) as a surgical intervention.  The patient's history has been reviewed, patient examined, no change in status, stable for surgery.  I have reviewed the patient's chart and labs.  Questions were answered to the patient's satisfaction.     Shamera Yarberry [employer]

## 2020-05-16 ENCOUNTER — Encounter (HOSPITAL_COMMUNITY): Payer: Self-pay | Admitting: Cardiology

## 2020-05-16 NOTE — Research (Signed)
Subject 009  Patient was screen failed for BatWire due to pro NT BNP to high for study. Patient aware, and understands.  Thanked him for wanting to participate in research.  Patient aware of other options to consider and he will be in touch with [place] about follow up.

## 2020-05-17 NOTE — Progress Notes (Signed)
EPIC Encounter for ICM Monitoring  Patient Name: Chad Christensen is a 54 y.o. male Date: 05/17/2020 Primary Care Physican: Daphine Deutscher Primary Cardiologist:Allred Electrophysiologist: Allred 4/30/2021Weight: 113lbs.  Spoke with patient and reports feeling well at this time.  Denies fluid symptoms.    He reports he is not taking Warfarin due to the prescribing physician had told him to stop it before he had the cath by Dr Shirlee Latch.  Advised Dr Alford Highland 6/4 note says he should continue with Warfarin.  Patient said the home health nurse is supposed to call the prescribing physician today.    CorvueThoracic impedancenormal.  Prescribed:Furosemide 40 mg 1 tablet as needed.  Labs: 05/10/2020 Creatinine 0.97, BUN 15, Potassium 4.3, Sodium 139, GFR >60 A complete set of results can be found in Results Review.  Recommendations:Advised patient to call the Warfarin prescribing physician as soon as ICM call ended to ask about restarting warfarin.  Advised it is imperative he call today since he is not taking and unsure if he should be. He verbalized he will call today.   Follow-up plan: ICM clinic phone appointment on7/11/2020. 91 day device clinic remote transmission9/06/2020. Office visit with Dr Shirlee Latch on 06/21/2020.  Copy of ICM check sent to Dr.Allred.  3 month ICM trend: 05/14/2020    1 Year ICM trend:       Karie Soda, RN 05/17/2020 8:43 AM

## 2020-05-20 ENCOUNTER — Other Ambulatory Visit (HOSPITAL_COMMUNITY): Payer: Self-pay | Admitting: Cardiology

## 2020-05-20 ENCOUNTER — Telehealth (HOSPITAL_COMMUNITY): Payer: Self-pay | Admitting: Pharmacy Technician

## 2020-05-20 NOTE — Telephone Encounter (Signed)
Received message that patient was concerned about [place] co-pay. A representative at his pharmacy told him that the co-pay would be about $700 a month.  I ran a test claim with his insurance and a 90 day supply is $9.20 for him. I advised him that the pharmacy representative was likely telling him a cash price. With his insurance he can afford the medication.   While we were discussing Marcelline Deist, the patient wanted to confirm whether or not he should be back on his Warfarin. Confirmed with Lauren [facility]), who stated that based on Dr. Alford Highland notes, that he should continue on his Warfarin. Patient had no concerns or objections to taking the Warfarin.  Archer Asa, CPhT

## 2020-05-21 ENCOUNTER — Telehealth: Payer: Self-pay | Admitting: *Deleted

## 2020-05-21 ENCOUNTER — Encounter: Payer: Self-pay | Admitting: *Deleted

## 2020-05-21 NOTE — Telephone Encounter (Signed)
Dr. Shirlee Latch has given a verbal order for this patient to have a Pro B natruretic peptide drawn.   I have contacted the patient and he requests that this bloodwork be drawn at his PCP office. I spoke to [facility] at  [facility]  Phone [phone]  Fax [phone] and she said this can be performed without the patient having an appt. Mr. Isidore will go to their office tomorrow to have this drawn. An order was sent to their Fax number and they will send [place] the results asap.

## 2020-05-21 NOTE — Progress Notes (Signed)
May 21, 2020  [facility] [address] [place], [place]  [address]   Please use this letter as an order to draw a Pro B Natruretic Peptide on this research study patient:       Chad Christensen       DOB; 05/12/1966   Please fax the results to [place] at [phone].

## 2020-05-22 ENCOUNTER — Encounter: Payer: Self-pay | Admitting: *Deleted

## 2020-05-22 DIAGNOSIS — Z006 Encounter for examination for normal comparison and control in clinical research program: Secondary | ICD-10-CM

## 2020-05-28 ENCOUNTER — Telehealth: Payer: Self-pay | Admitting: *Deleted

## 2020-05-29 ENCOUNTER — Telehealth: Payer: Self-pay | Admitting: *Deleted

## 2020-05-29 DIAGNOSIS — Z006 Encounter for examination for normal comparison and control in clinical research program: Secondary | ICD-10-CM

## 2020-05-29 NOTE — Telephone Encounter (Signed)
Called Mr. Brickner to arrange CTA Neck and ENT visit for BATwire screening.

## 2020-05-30 ENCOUNTER — Other Ambulatory Visit: Payer: Self-pay

## 2020-05-30 ENCOUNTER — Ambulatory Visit (INDEPENDENT_AMBULATORY_CARE_PROVIDER_SITE_OTHER): Payer: Medicare Other | Admitting: Otolaryngology

## 2020-05-30 ENCOUNTER — Encounter (INDEPENDENT_AMBULATORY_CARE_PROVIDER_SITE_OTHER): Payer: Self-pay | Admitting: Otolaryngology

## 2020-05-30 ENCOUNTER — Ambulatory Visit (HOSPITAL_COMMUNITY)
Admission: RE | Admit: 2020-05-30 | Discharge: 2020-05-30 | Disposition: A | Discharge status: Home / Self Care | Payer: Medicare Other | Source: Ambulatory Visit | Attending: Surgery | Admitting: Surgery

## 2020-05-30 VITALS — Temp 97.3°F

## 2020-05-30 DIAGNOSIS — Z006 Encounter for examination for normal comparison and control in clinical research program: Secondary | ICD-10-CM

## 2020-05-30 MED ORDER — IOHEXOL 350 MG/ML SOLN
75.0000 mL | Freq: Once | INTRAVENOUS | Status: AC | PRN
  Administered 2020-05-30: 75 mL via INTRAVENOUS

## 2020-05-30 NOTE — Research (Signed)
Updated note will recheck his BNP per study sponsor.

## 2020-05-30 NOTE — Progress Notes (Addendum)
Section A:  Administrative Section  Subject ID: _[id number]_ - _009____ - 015 Subject Initials: __BWE_ ___ ___ Visit Interval:  (* = as required) [x]   Baseline     []  Implant/Pre D/C  Date of Report:   __24___/_JUN___/___2021____       (DD / MMM / [place] [facility])  []   1 Month     []  3 Month*  Name of ENT __Christopher Lucia Gaskins MD_  []   6 Month*     []  12 Month*     []  Unscheduled*  Directions:  Baseline: complete sections B-E only   All other visits, complete all sections below (B-G)   Use comments box to document any abnormalities or changes from Baseline  Was the CVRx device turned off? []  Yes   []  No, specify reason why not: __________________________________  Was a laryngoscope used? [x]  Yes   []  No  Section B:  Dysphonia   Global Rating of Dysphonia: When mucus cleared from throat (Absence of recent upper respiratory infection or recent extensive voice abuse)   [x]   Normal (or clear)   []   Mild   []   Mild to Moderate   []   Moderate   []   Moderate to Severe   []   Severe   []   Aphonic  Section C:  Tongue/Pharynx   Assessment Observation Severity (complete only if abnormal); defined as:    Mild Significant impairment of functioning; patient is unable to carry out usual activities.    Moderate Patient experiences sufficient discomfort to interfere with or reduce their usual level of activity.    Severe Clinically evident impairment of functioning; patient is unable to carry out usual activities.  Pharynx at Rest [x]   Symmetric []   Mild   []  Asymmetric []  Moderate     []  Severe  Pharynx with Phonation ("ah") [x]   Symmetric []   Mild   []  Asymmetric []  Moderate     []  Severe  Tongue Atrophy [x]   Not Present []   Mild   []  Present []  Moderate     []  Severe  Tongue Mobility [x]   Normal []   Mild   []  Reduced []  Moderate     []  Severe  Tongue Protrusion [x]   Midline []   Mild Side: ____________   []  Deviation []  Moderate      []  Severe   Sensation of the Pharynx [x]   Normal []   Mild   []   Decreased []  Moderate     []  Severe  Section D:  House-Brackman Facial Paralysis Scale (Circle One)  Grade Impairment  I  [x]  Normal  II  []  Mild Dysfunction (slight weakness, normal symmetry at rest)  III  []  Moderate Dysfunction (obvious but not disfiguring weakness with synkinesis, normal symmetry at rest); Complete eye closure with maximal effort; Good forehead movement  IV  []  Moderately severe dysfunction (obvious and disfiguring asymmetry, significant synkinesis); Incomplete eye closure; moderate forehead movement  V  []  Severe dysfunction (barely perceptible motion  VI  []  Total paralysis (no movement)  Section E:  Baseline Cranial Nerve Damage  Does the subject have a presence of baseline cranial nerve dysfunction []  Yes, specify what dysfunction: _____________________________________   [x]  No  Section F:  ENT Notes (Pre-D/C and Follow-Up)  Were changes in Dysphonia from baseline or any abnormalities related to the [facility] Implant? []   Yes   []  No   []  Unknown   []  Not Applicable  Were changes in Tongue/Pharynx from baseline or any abnormalities related to the [facility] Implant? []   Yes   []  No   []  Unknown   []  Not Applicable  Were changes in House-Brackman Facial Paralysis Scale from baseline or any abnormalities related to the [facility] Implant? []   Yes   []  No   []  Unknown   []  Not Applicable  Were any of the above changes from baseline potentially related to the intubation for the procedure? []   Yes   []  No   []  Unknown   []  Not Applicable  Section G:  Cranial Nerves  Has there been new cranial nerve dysfunction since Baseline?   Marginal mandibular branch of cranial nerve VII (Facial Nerve) []  Yes, specify what changed: ___________________________________   []  No  Cranial nerve IX (Glossopharyngeal Nerve) []  Yes, specify what changed: ___________________________________   []  No  Cranial nerve X  (Vagus Nerve) []  Yes, specify what changed:  ___________________________________   []  No  Cranial nerve XI  (Accessory Nerve) []  Yes, specify what changed: ___________________________________   []  No  Cranial nerve XII  (Hypoglossal Nerve) []  Yes, specify what changed: ___________________________________   []  No  Section H:  Comments/Notes/Describe any abnormalities/deficiencies (as well if post-operative change)

## 2020-06-03 ENCOUNTER — Telehealth: Payer: Self-pay | Admitting: *Deleted

## 2020-06-03 NOTE — Research (Signed)
Patient ID: _[id number]_ - _009_ - 015 Patient Initials: _B_ _W_ _E_ Visit Interval:  [x]   Baseline     []  6 Month  Section D:  MLWHF   [place] Living With Heart Failure Questionnaire Date:    _04_/_JUN_/_2021______  (DD / MMM / YYYY)  These questions concern how your heart failure (heart condition) has prevented you from living as you wanted during the last month.  These items listed below describe different ways some people are affected.  If you are sure an item does not apply to you or is not related to your heart failure then circle 0 (no) and go on to the next item.  If an item does apply to you, then circle the number rating how much it prevented you from living as you wanted.  Did your heart failure prevent you from living as you wanted during the last month by:   No Very little    Very much  1. Causing swelling in your ankles, legs, etc.? 0[x]  1[]  2[]  3[]  4[]  5[]   2. Making you sit or lie down to rest during the day? 0[]  1[]  2[]  3[x]  4[]  5[]   3. Making your walking about or climbing stairs difficult? 0[]  1[]  2[]  3[]  4[x]  5[]   4. Making your working around the house or yard difficult? 0[]  1[]  2[x]  3[]  4[]  5[]   5. Making your going places away from home difficult? 0[]  1[]  2[]  3[]  4[]  5[x]   6. Making your sleeping well at night difficult? 0[x]  1[]  2[]  3[]  4[]  5[]   7. Making your relating to or doing things with your friends or family difficult? 0[]  1[]  2[x]   3[]  4[]  5[]   8. Making your working to earn a living difficult? 0[x]  1[]  2[]  3[]  4[]  5[]   9. Making your recreational pastimes, sports or hobbies difficult? 0[]  1[]  2[]  3[]  4[]  5[x]   10. Making your sexual activities difficult? 0[]  1[]  2[]  3[x]  4[]  5[]   11. Making you eat less of the foods you like? 0[x]  1[]  2[]  3[]  4[]  5[]   12. Making you short of breath? 0[]  1[]  2[]  3[]  4[x]  5[]   13. Making you tired, fatigued, or low on energy? 0[]  1[]  2[]  3[]  4[x]  5[]   14. Making you stay in a hospital? 0[x]  1[]  2[]  3[]  4[]  5[]   15. Costing you money  for medical care? 0[]  1[]  2[]  3[x]  4[]  5[]   16. Giving you side effects from medications? 0[x]  1[]  2[]  3[]  4[]  5[]   17. Making you feel you are a burden to your family or friends? 0[]  1[]  2[]  3[]  4[]  5[x]   18. Making you feel a loss of self-control in your life? 0[]  1[]  2[]  3[]  4[]  5[x]   19. Making you worry? 0[]  1[]  2[]  3[]  4[]  5[x]   20. Making it difficult for you to concentrate or remember things? 0[]  1[]  2[]  3[]  4[]  5[x]   21 Making you feel depressed? 0[]  1[]  2[x]  3[]  4[]  5[]   Section E:  Signature Section  Person Administering Questionnaire Name: (person who read first question and handed questionnaire to subject) :) _______ (Print) 05/10/2020   __Kimberly Mercer Pod :)_______ (Sign) (Date)  Person Completing Questionnaire Name: (e.g. subject, person reading questionnaire, etc.) ___Kimberly 07/10/2020 :)___ (Print) 05/10/2020   __Kimberly Ivory Broad :)______ (Sign) (Date)

## 2020-06-03 NOTE — Telephone Encounter (Signed)
Pt aware of CTA and ENT scheduling. Travel arrangement is set up.

## 2020-06-03 NOTE — Research (Signed)
BatWire Informed Consent   Subject Name: Chad Christensen  Subject met inclusion and exclusion criteria.  The informed consent form, study requirements and expectations were reviewed with the subject and questions and concerns were addressed prior to the signing of the consent form.  The subject verbalized understanding of the trial requirements.  The subject agreed to participate in the BatWire trial and signed the informed consent at [facility] on 04/Jun/2021.  The informed consent was obtained prior to performance of any protocol-specific procedures for the subject.  A copy of the signed informed consent was given to the subject and a copy was placed in the subject's medical record.   Philemon Kingdom D

## 2020-06-03 NOTE — Research (Signed)
  Current Outpatient Medications:  .  albuterol (ACCUNEB) 1.25 MG/3ML nebulizer solution, Take 1.25 mg by nebulization every 6 (six) hours as needed for wheezing. , Disp: , Rfl:  .  amiodarone (PACERONE) 200 MG tablet, Take 1 tablet (200 mg total) by mouth daily., Disp: 90 tablet, Rfl: 1 .  clonazePAM (KLONOPIN) 0.5 MG tablet, Take 0.5 mg by mouth 2 (two) times daily. , Disp: , Rfl:  .  COLCRYS 0.6 MG tablet, Take 0.6 mg by mouth 2 (two) times daily as needed (for gout flare up). , Disp: , Rfl:  .  dapagliflozin propanediol (FARXIGA) 10 MG TABS tablet, Take 1 tablet (10 mg total) by mouth daily before breakfast., Disp: 30 tablet, Rfl: 6 .  digoxin (LANOXIN) 0.125 MG tablet, Take 1 tablet (0.125 mg total) by mouth daily. Telehealth visit 12/2019 with Dr. [name]., Disp: 90 tablet, Rfl: 1 .  furosemide (LASIX) 40 MG tablet, Take 40 mg by mouth daily as needed for fluid. , Disp: , Rfl:  .  mirtazapine (REMERON) 15 MG tablet, Take 15 mg by mouth at bedtime., Disp: , Rfl:  .  nystatin (MYCOSTATIN) 100000 UNIT/ML suspension, Take 5 mLs by mouth daily as needed (thrush). , Disp: , Rfl:  .  pramipexole (MIRAPEX) 0.125 MG tablet, Take 0.125 mg by mouth 3 (three) times daily. , Disp: , Rfl:  .  rOPINIRole (REQUIP) 1 MG tablet, Take 1 mg by mouth 3 (three) times daily., Disp: , Rfl:  .  simvastatin (ZOCOR) 5 MG tablet, Take 5 mg by mouth daily., Disp: , Rfl:  .  spironolactone (ALDACTONE) 25 MG tablet, Take 1 tablet (25 mg total) by mouth daily. *Patient is overdue for an appointment and needs to call and schedule for further refills* (Patient taking differently: Take 25 mg by mouth daily. ), Disp: 15 tablet, Rfl: 0 .  warfarin (COUMADIN) 2 MG tablet, Take 2 mg by mouth daily., Disp: , Rfl:

## 2020-06-03 NOTE — Research (Signed)
Section A:  Administrative Section  Subject ID: _[id number]_ - _009_ - 015 Subject Initials: _B_ _W_ _E_  Visit Interval:  (* = as required)   [x]  Screening/Baseline    []  1 Month   []  3 Month*       []  6 Month*     []  12 Month*         []  Unscheduled*, reason for visit: _________________________________________  Section D:  Voice Handicap Index - 10   Voice Handicap Index Date:    _04_/_JUN_/_2021______  (DD / MMM / )  Instructions: These are statements that many people have used to describe their voices and the effects of their voices on their lives.  Circle the response that indicates how frequently you have the same experience:   Never Almost Never Some-times Almost Always Always  1. My voice makes it difficult for people to hear me. 0 []  1 []  2 []  3 [x]  4 []   2. People have difficulty understanding me in a noisy room. 0 [x]  1 []  2 []  3 []  4 []   3. My voice difficulties restrict personal and social life. 0 []  1 []  2 [x]  3 []  4 []   4. I feel left out of conversations because of my voice. 0 []  1 [x]  2 []  3 []  4 []   5. My voice problem causes me to lose income. 0 [x]  1 []  2 []  3 []  4 []   6. I feel as though I have to strain to produce voice. 0 []  1 []  2 []  3 [x]  4 []   7. The clarity of my voice is unpredictable. 0 []  1 []  2 []  3 []  4 [x]   8. My voice problem upsets me. 0 []  1 []  2 []  3 []  4 [x]   9. My voice makes me feel handicapped. 0 []  1 []  2 []  3 []  4 [x]   10. People ask, "What's wrong with your voice?" 0 []  1 [x]  2 []  3 []  4 []     Section D:  Eating Assessment Tool - 10   Eating Assessment Index Date:    _04_/_JUN_/_2021_  (DD / MMM / YYYY)  To what extent are the following scenarios problematic for you: (Circle the appropriate response)   No Problem    Severe Problem  1. My swallowing problem has caused me to lose weight. 0 []  1 [x]  2 []  3 []  4 []   2. My swallowing problem interferes with my ability to go out for meals. 0 []  1 []  2 []  3 [x]  4 []   3. Swallowing liquids takes extra  effort. 0 []  1 []  2 []  3 [x]  4 []   4. Swallowing solids takes extra effort. 0 []  1 []  2 []  3 [x]  4 []   5. Swallowing pills takes extra effort. 0 []  1 []  2 [x]  3 []  4 []   6. Swallowing is painful. 0 []  1 [x]  2 []  3 []  4 []   7. The pleasure of eating is affected by my swallowing. 0 []  1 []  2 []  3 [x]  4 []   8. When I swallow, food sticks in my throat. 0 []  1 []  2 []  3 []  4 [x]   9. I cough when I eat. 0 []  1 []  2 []  3 []  4 [x]   10. Swallowing is stressful. 0 []  1 []  2 [x]  3 []  4 []   Section E:  Signature Section   Person Administering Questionnaire Name: (person who instructed the subject on how to complete the  form) Mercer Pod :) _____ (Print)    Mercer Pod :) ___ (Sign) _06/28/2021_________ (Date)  Person Completing Questionnaire Name: (e.g. subject, person reading questionnaire, etc.) __[name] :) ___ (Print)    Mercer Pod :) ___ (Sign) __06/28/2021____________ (Date)

## 2020-06-03 NOTE — Research (Addendum)
Subject ID: _[id number]_ - _009_ - 015 Subject Initials: _B_ _W_ _E_ Visit Interval:  [x]   Baseline     []  6 Month  Section B:  NYHA   NYHA Classification Date:    _04_/_JUN_/_2021______  (DD / MMM / YYYY)  Select One Class Subject Symptoms  []  I No limitations of physical activity, No undue fatigue, palpitation or dyspnea  []  II Slight limitation of physical activity, Comfortable at rest, Less than ordinary activity results in fatigue, Palpitation, or dyspnea  [x]  III Marked limitation of physical activity, Comfortable at rest, Less than ordinary activity results in fatigue, palpitation, or dyspnea  []  IV Unable to carry out any physical activity without discomfort, Symptoms of cardiac insufficiency at rest, Physical activity causes increased discomfort  Name of person conducting NYHA: __Kimberly Felisha Claytor :) _______________

## 2020-06-03 NOTE — Research (Signed)
Section A:  Administrative Section  Subject ID: _[id number]_ - _[id number]_ - [id number] Subject Initials: _B_ _W_ _E_  Date subject signed informed consent  _04_/_Jun_/_2021__  (DD /  MMM  / YYYY)  Section B:  Enrollment Criteria  Does the subject meet the FDA Indication for Use: Patients who remain symptomatic despite treatment with guideline-directed medical therapy, are NYHA Class III or Class II (who had a recent history of Class III), have a left ventricular ejection fraction ? 35%, a NT-proBNP < 1600 pg/ml and excluding patients indicated for Cardiac Resynchronization Therapy (CRT) according to AHA/ACC/ESC guidelines [x]  Yes    []  No (STOP - subject does not qualify for the study)  Has the subject been previously, or are currently, randomized in the CVRx BeAT-HF Trial? []  Yes (STOP - subject does not qualify for the study)   [x]  No  Has the subject received cardiac resynchronization therapy (CRT) within six months of enrollment, or is actively receiving CRT? []  Yes (STOP - subject does not qualify for the study)   [x]  No  Section B:  Screening/Baseline Assessments   Physical Assessment:            Date:    _04_/_Jun_/_2021_ (DD / MMM / Dallas Breeding)  Weight: _107.12__  []  kg [x]  pounds Height: _182_  [x]  cm []  inches  Blood Pressure: _108_  / _62_mmHg Heart Rate: _82_ bpm  Section C:  Labs   eGFR:  _60_ mL/min/1.71m Date:    _04_/_Jun_/_2021_ (DD / MMM / YYYY)  Negative Pregnancy Test? [x]  N/A Date:    _____/_______/_______                  (DD / MMM / YDallas Breeding   []  Yes     []  No   Section D:  Appropriate Surgical/Study Candidate:   Is the subject able to discontinue the use of antiplatelet drugs (e.g. aspirin) in advance of the procedure, if required? [x]  Yes   []  No  Assessed by:   ___Wells Brabham_____________________ Implanting Physician [x]  Yes Date:    _29_/_JUN_/_2021_  (DD / MMM / YYYY)   []  No   Section E:  Inclusion/Exclusion Criteria  Does the subject meet all Inclusion Criteria? [x]  Yes   []   No (STOP - subject does not qualify for the study)   Check all that apply   []  1. Age at least 21 years and no more than 80 years at the time of enrollment.   []  2. Appropriate candidate for the surgery as determined by an evaluation from the implanting physician using a carotid duplex ultrasound (CDU) [or Computed Tomography Angiography (CTA) if CDU inconclusive or incomplete] and a review of medical history (including existence of infections that may increase implant risk).  Evaluation must confirm the following within 30 days of the BAROSTIM NEO implant: . Appropriate medical condition and medical history for implantation of the BAROSTIM NEO System AND . Anatomy that enables this implant procedure, with no vascular structures or orientations or neck anomalies that would be obstructive to the implantation path AND . The artery planned for the BAROSTIM NEO implant must have: o A carotid bifurcation below the level of the mandible AND o No ulcerative carotid arterial plaques AND o No carotid atherosclerosis producing a 30% or greater stenosis in linear diameter in the internal carotid AND o No carotid atherosclerosis producing a 30% or greater stenosis in linear diameter in the distal common carotid AND . Have had no prior surgery, radiation, or endovascular stent  placement in the carotid artery or the carotid sinus region AND . Able to discontinue the use of antiplatelet drugs (e.g. aspirin) in advance of the procedure, if required   []  3. Six-minute hall walk (6MHW) ? 150 m AND ? 400 m within 30 days prior to implant.   []  4. Serum estimated glomerular filtration rate (eGFR) ? 25 mL/min/1.73 m2 using the CKD-EPI method within 30 days prior to the [facility] NEO implant.   []  5. Body mass index ? 40 kg/m2 within 30 days prior to the [facility] NEO implant.   []  6. If male and of childbearing potential, must use a medically accepted method of birth control (e.g., barrier method with spermicide, oral  contraceptive, or abstinence) and agree to continue use of this method for the duration of the study. Women of childbearing potential must have a negative pregnancy test within 14 days prior to the [facility] NEO implant.   []  7. Subjects implanted with a cardiac rhythm management device that does not utilize an intracardiac lead, or implanted with a neurostimulation device, must be approved by the [place].   []  8. Signed a CVRx-approved informed consent form for participation in this study.      Does the subject meet any Exclusion Criteria? []  Yes (STOP - subject does not qualify for the study)   [x]  No     List criteria # met: __________________________   []  1. Previously or currently randomized in the CVRx BeAT-HF Trial.   []  2. Received cardiac resynchronization therapy (CRT) within six months of enrollment or is actively receiving CRT.   []  3. Any of the following contraindications: . Baroreflex failure or autonomic neuropathy  . Uncontrolled, symptomatic cardiac bradyarrhythmias . Known allergy to silicone or titanium   []  4. Unstable ventricular arrhythmias.   []  5. Presence of baseline cranial nerve dysfunction determined by the Ear, Nose and Throat (ENT) examination.   []  6. Subjects with any surgery that has occurred, or is planned to occur, within 45 days of the [facility] NEO implant.   []  7. Recent history (within 6 months of implant) of significant and uncontrolled bleeding.   []  8. Known and untreated hypercoagulability state.   []  9. An inappropriate study candidate as evidenced by: Marland Kitchen Solid organ or hematologic transplant, or currently being evaluated for an organ transplant. . Has received or is receiving LVAD therapy or chronic dialysis. . Current or planned treatment with intravenous positive inotrope therapy. . Primary pulmonary hypertension. . Severe COPD or severe restrictive lung disease (e.g. requires chronic oral steroid use or home oxygen use). .  Heart  failure secondary to a reversible cause, such as cardiac structural valvular disease, acute myocarditis and pericardial constriction. . Clinically significant cardiac structural valvular disease. .  Unable or unwilling to fulfill the protocol medication compliance and follow-up requirements, for reasons including but not limited to an unresolved history of alcohol or substance abuse or psychiatric disorder. . Active malignancy. .  Any other serious medical condition that may adversely affect the safety of the participant or validity of the study, in the opinion of the investigator. . Life expectancy less than one year.   []  10. Any of the following within 3 months prior to the [facility] NEO implant. . Myocardial infarction . Unstable angina . Coronary intervention (e.g. CABG or PTCA)  . Cerebral vascular accident or transient ischemic attack . Sudden cardiac death  . Surgical cardiac intervention (e.g. cardiac ablation, valve replacement)   []  11. Enrolled  and active in another (e.g. device, pharmaceutical, or biological) clinical study unless approved by the [place].  Section F:  Adverse Events  Were there any Adverse Events that occurred since consent? []  Yes (complete AE form)   [x]  No  Section H:  Medication Changes   Have there been any changes to the subject's home use medications for Arrhythmia, Antiplatelet/Anticoagulation and Heart failure medications during screening and baseline? []  Yes (update Med form)   [x]  No  Section I:  Signature    Person completing form (Print Name): ___Kimberly Alba Destine :)    Signature: Philemon Kingdom :) ________ Date: _____06/29/2021_____________________

## 2020-06-03 NOTE — Telephone Encounter (Signed)
Per Dr. Myra Gianotti, patient needs to stop Coumadin today and tomorrow. Patient will be instructed on what to do with the Coumadin after tomorrow's evaluation. I called Chad Christensen and discussed this and he voiced understanding and agreement of this plan.

## 2020-06-03 NOTE — Research (Signed)
Six Minute Hall Walk Test Date:    _04_/_JUN_/_2021______  (DD / MMM / Annamarie Major)  Distance Walked __183__ meters  Were there any devices used to assist the subject in walking (e.g. cane, walker, etc.)? []  No   [x]  Yes specify:___waker____________________  Was the walk terminated before 6 minutes? [x]  No   []  Yes specify reason: (select all that apply)    []  Angina    []   Dyspnea    []   Fatigue    []   Dizziness    []   Syncope    []   Other, specify:  Name of person conducting 6-Minute Walk: :) _____   Page 1 of 1     Confidential   Form Protocol [phone] Rev. B dated 13-Sep-2019   Effective Date: 26-Oct-2019

## 2020-06-03 NOTE — Research (Addendum)
Section A:  Demographic Section  Subject ID: _[id number] - _[id number]_ - [id number] Subject Initials: _B_ _W_ _E_  Date of Birth: _ error KL:) 04/11/2023                              [birth year]                              (DD / MMM / Dallas Breeding) Gender: [x]  Male    []  Male  Ethnicity  []  Asian    [] Black/African Descent [] [place]        [x]  White/Caucasian     [] Other/Refused   Section B:  Medical History  1. BAROSTIM NEO FDA Indication for Use  NYHA Classification: Class: ____3_________ Date:       __04__ / __JUN__ / _2021__                   DD          MMM        YYYY  LVEF: _____20____ % Date:       _04_ / _JUN_ / _2021__                   DD     MMM     YYYY  NT-proBNP: __1513_____ pg/ml Date:       _16_ / _JUN_ / _2021                  DD     MMM      YYYY  CRT Indication: []     Yes          [x]   No Assessment Date:  _04_ / _JUN_ / _2021_                                   DD     MMM     YYYY  2. Type of Cardiomyopathy []  Ischemic [x]  Non-ischemic []  Other, specify: ___________    Yes No Unknown  3. 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 CardioMEMS) Specify Device: ____________ Implant Date: ________/________ (MMM / [place] Breeding) [x]  []    (f) Myocardial Infarction []  [x]  []    (g) CABG []  [x]  []    (h) Coronary Intervention []  [x]  []    (i) Aortic Valve Disease []  [x]  []    (j) Mitral Valve Disease []  [x]  []    (k) Left Ventricular Hypertrophy []  [x]  []   4. Cardiac Arrhythmia (a) Atrial fibrillation [x]  []  []    (b) Ventricular Tachycardia/Fibrillation [x]  []  []    (c) PVC []  [x]  []    (d) Pacemaker []  [x]  []    (e) ICD [x]  []  []    (f) Ablation Procedure  Specify:[]  atrial []  ventricular Last procedure date:         ________/________ (MMM / [place] Breeding) [x]  []    (g) Cardioversion Procedure Specify: [] Shock [] Meds Last procedure date:         ________/________ (MMM /  YYYY) [x]  []    (h) Sudden Cardiac Death []  [x]  []     Yes No Unknown  4. Peripheral Vascular (a) Peripheral Vascular Disease []  [x]  []    (b) PVD Intervention (e.g. stent, angioplasty, etc.). []  [x]  []   5. Cerebrovascular / Neurological (a) Stroke [x]  []  []    (b) TIA []  [x]  []   6. Renal / Hepatic (a) Prior Renal Denervation Date of Denervation:        ________/________ (MMM / ) [x]  []    (b) Chronic Kidney Disease Stage (1-5): ________   [x]  []    (c) Chronic Dialysis []  [x]  []    (d) Kidney Transplant []  [x]  []   7. Metabolic / Nutritional (a) Diabetes Mellitus  []   Type 1  []   Type 2 [x]  []    (b) Hypokalemia []  [x]  []   Section C:  Comments  NONE        Section D:  Signature     Person completing form (Print Name): __Kimberly :) ___________________    Signature: ___Kimberly :) ___ Date: _____06/16/2021_____________________

## 2020-06-03 NOTE — Research (Signed)
Patient ID: _[id number] - _[id number]_ - [id number] Patient Initials: _B_ _W_ _E_  Visit Interval:    [x]   Screening/Baseline    []  Activation   []  0.5 Month       []  1 Month     []  2 Month     []  3 Month       []  6 Month     []  12 Month         []  Unscheduled, reason for visit: _________________________________________  SECTION B:  MVHQI-69 Like Illness Symptoms   Has the subject experienced any cold, flu or COVID-19 symptoms in the past 30 days? [x]   No  (skip to section C)     []  Yes, date of onset:  _____/_____ MMM/YYYY    If yes, check all symptoms that apply:   []  Fevers or chills   []   New or Worsening Cough  []  Productive  []  Dry     If yes to cough, indicate severity  []  Constant  []  Occasional, several per hour   []  New or worsened shortness of breath   []  Diarrhea   []  Altered or reduced sense of smell or taste   []  Muscle aches/Severe Fatigue   []  Chest pain or tightness   []  Sore throat   []  Nausea or vomiting  SECTION C:  COVID-19 Like Illness Testing   Has the patient been tested for COVID-19? [x]  No     []  Yes, date of test:  _____/_____ MMM/YYYY    If yes, test results:   []  Positive []  Negative []  Unknown  Has the patient been tested for COVID-19 Antibodies? [x]  No     []  Yes, date of test:  _____/_____ MMM/YYYY    If yes, test results:   []  Positive []  Negative []  Unknown  Has the patient been vaccinated for COVID-19? []  No     [x]  Yes, date of test:  _May_/_2021_ MMM/YYYY  Has the patient been tested for Influenza ("flu")? [x]  No     []  Yes, date of test:  _____/_____ MMM/YYYY    If yes, test results:   []  Positive []  Negative []  Unknown  Has the patient been vaccinated for Influenza ("flu")? [x]  No     []  Yes, date of test:  _____/_____ MMM/YYYY  SECTION D:  COVID-19 Like Illness Exposure  Has the subject been told that they might have had COVID-19/have symptoms suggestive of COVID-19? [x]    No   []  Yes   []   Unknown  Has the subject been exposed to anyone with  known or suspected COVID-19? [x]    No   []  Yes   []   Unknown  Has the subject been told that they might have had the "flu" or influenza? [x]    No   []  Yes   []   Unknown  SECTION E:  Effects of COVID Pandemic on [place] Interactions  Since the last study visit, did the subject feel the need to go to an emergency department or hospital for their heart failure but decided not to because of concerns about COVID-19? [x]   No   []  Yes   []  Unknown    If yes, how did they seek care (check all that apply):   []  Telemedicine visit []  In-person []  Clinic []  Urgent Care   []  Subject did not change hospital/ER use due to COVID-19 []  Other, specify: ________________________  Since the last study visit, did the subject have a cardiology/HF related appointment cancelled/rescheduled due to  COVID-19 pandemic? [x]   No   []  Yes, how many? ___________   []  Unknown  Since the last study visit, did the subject have any cardiology/HF related telemedicine visit due to COVID-19 pandemic? [x]   No   []  Yes, how many? ___________   []  Unknown  Since the last study visit, did the subject have a cardiology/HF procedure cancelled/rescheduled due to COVID-19 pandemic? [x]   No   []  Yes, how many? ___________   []  Unknown  SECTION F:  Effects of COVID Pandemic on Subject's Medications  Since the last visit, did any of their heart failure medications change or stop, even for a short time?   If yes, enter change into medication eCRF [x]   No   []  Yes, how many? ___________   []  Unknown    If yes, why:   []  Instructed by Doctor []  Self-discontinued []  Unknown []  Other: ________________  Since the last visit, was the subject prescribed any medications for COVID-19? [x]   No   []  Yes   []  Unknown    If yes, what medications (generic name):  SECTION G:  Effects of COVID Pandemic on Subject's Lifestyle  How has the subject's activity/exercise level changed due to COVID-19 pandemic? [x]   No change   []  More  activity/exercise   []  Less activity/exercise  How has the subject's smoking habits changed due to COVID-19? [x]   Does not smoke   []  No change   []  Smoke more   []  Smoke less  How has the subject's alcohol drinking habits changed due to COVID-19? []   Does not drink   [x]  No change   []  Drink more   []  Drink less  Section H:  Signature    Person completing form (Print Name): ___Kimberly :) _________________    Signature: :) __________ Date: __06/04/2021_

## 2020-06-04 ENCOUNTER — Encounter: Payer: Medicare Other | Admitting: *Deleted

## 2020-06-04 ENCOUNTER — Other Ambulatory Visit (HOSPITAL_COMMUNITY)
Admission: RE | Admit: 2020-06-04 | Discharge: 2020-06-04 | Disposition: A | Discharge status: Home / Self Care | Payer: Medicare Other | Source: Ambulatory Visit | Attending: Internal Medicine | Admitting: Internal Medicine

## 2020-06-04 DIAGNOSIS — Z006 Encounter for examination for normal comparison and control in clinical research program: Secondary | ICD-10-CM

## 2020-06-04 LAB — SARS CORONAVIRUS 2 (TAT 6-24 HRS): SARS Coronavirus 2: NEGATIVE

## 2020-06-05 ENCOUNTER — Ambulatory Visit (HOSPITAL_COMMUNITY): Admission: RE | Admit: 2020-06-05 | Payer: Medicare Other | Source: Ambulatory Visit

## 2020-06-05 ENCOUNTER — Telehealth: Payer: Self-pay | Admitting: *Deleted

## 2020-06-05 NOTE — Research (Signed)
Section A:  Administrative Section  Subject ID: _[id number]_ - _[id number]_ - [id number] Subject Initials: _B_ _W_ _E_ Visit Interval:  [x]  Baseline     []  6 Month     []  12 Month  Section B:  Cardiac Echo   Cardiac Echo Date:  _04_/_JUN_/_2021____ (DD / MMM / YYYY)  [x]  Images sent to [facility]  Heart rate __71__ bpm Echo type: [x]  2D Images Captured: (4-Chamber and Biplane required) [x]  PS-LAX  Blood Pressure _108_/_62_ mmHg  []  3D  []  Biplane        [x]  Apical 4-Chamber        []  Other, specify:_______   LV End Diastolic Dimension __75___ mm   LV End Systolic Dimension __69___ mm   LA Volume __39___ mL   LV End-Diastolic Volume ___298___ mL   LV End-Systolic Volume __247 ___ mL   LV Ejection Fraction ___20____ %  Section C:  Comments  N/A          Section C:  Signature   Person completing form (Print Name): ______Kimberly :) ________   Signature: :) __ Date: ___06/30/2021___________

## 2020-06-05 NOTE — Telephone Encounter (Addendum)
I have called the patient and given him instructions to hold Coumadin. He will be having surgery on Friday in the Cath Lab. He will also hold his multivitamin. He will be NPO after midnight on Thursday and will hold all meds on Friday morning. Our office will provide transportation on Friday and patient will be picked up at 0800.

## 2020-06-05 NOTE — Telephone Encounter (Signed)
Transportation arranged for pick up Friday July 2 at 8 am @ his home to be here at [facility] by 9 am. Then he will be picked up at 12pm July 3 from [facility] to go home.

## 2020-06-05 NOTE — Telephone Encounter (Signed)
Patient called the [facility] office wanting to know if he should hold any more of his Coumadin this week before he has his procedure on Friday.   Please give pt a call [phone]

## 2020-06-05 NOTE — Research (Signed)
Patient came in today to see Dr Myra Gianotti for assessment of carotids.    Current Outpatient Medications:  .  albuterol (ACCUNEB) 1.25 MG/3ML nebulizer solution, Take 1.25 mg by nebulization every 6 (six) hours as needed for wheezing. , Disp: , Rfl:  .  amiodarone (PACERONE) 200 MG tablet, Take 1 tablet (200 mg total) by mouth daily., Disp: 90 tablet, Rfl: 1 .  clonazePAM (KLONOPIN) 0.5 MG tablet, Take 0.5 mg by mouth 2 (two) times daily. , Disp: , Rfl:  .  COLCRYS 0.6 MG tablet, Take 0.6 mg by mouth 2 (two) times daily as needed (for gout flare up). , Disp: , Rfl:  .  dapagliflozin propanediol (FARXIGA) 10 MG TABS tablet, Take 1 tablet (10 mg total) by mouth daily before breakfast., Disp: 30 tablet, Rfl: 6 .  digoxin (LANOXIN) 0.125 MG tablet, Take 1 tablet (0.125 mg total) by mouth daily. Telehealth visit 12/2019 with Dr. [name]., Disp: 90 tablet, Rfl: 1 .  furosemide (LASIX) 40 MG tablet, Take 40 mg by mouth daily as needed for fluid. , Disp: , Rfl:  .  mirtazapine (REMERON) 15 MG tablet, Take 15 mg by mouth at bedtime., Disp: , Rfl:  .  nystatin (MYCOSTATIN) 100000 UNIT/ML suspension, Take 5 mLs by mouth daily as needed (thrush). , Disp: , Rfl:  .  pramipexole (MIRAPEX) 0.125 MG tablet, Take 0.125 mg by mouth 3 (three) times daily. , Disp: , Rfl:  .  rOPINIRole (REQUIP) 1 MG tablet, Take 1 mg by mouth 3 (three) times daily., Disp: , Rfl:  .  simvastatin (ZOCOR) 5 MG tablet, Take 5 mg by mouth daily., Disp: , Rfl:  .  spironolactone (ALDACTONE) 25 MG tablet, Take 1 tablet (25 mg total) by mouth daily. *Patient is overdue for an appointment and needs to call and schedule for further refills* (Patient taking differently: Take 25 mg by mouth daily. ), Disp: 15 tablet, Rfl: 0 .  warfarin (COUMADIN) 2 MG tablet, Take 2 mg by mouth daily., Disp: , Rfl:

## 2020-06-07 ENCOUNTER — Ambulatory Visit (HOSPITAL_COMMUNITY): Payer: Medicare Other | Admitting: Anesthesiology

## 2020-06-07 ENCOUNTER — Ambulatory Visit (HOSPITAL_COMMUNITY)
Admission: RE | Disposition: A | Discharge status: Home / Self Care | Payer: Self-pay | Source: Home / Self Care | Attending: Surgery

## 2020-06-07 ENCOUNTER — Inpatient Hospital Stay (HOSPITAL_COMMUNITY): Payer: Medicare Other

## 2020-06-07 ENCOUNTER — Other Ambulatory Visit: Payer: Self-pay

## 2020-06-07 ENCOUNTER — Observation Stay (HOSPITAL_COMMUNITY)
Admission: RE | Admit: 2020-06-07 | Discharge: 2020-06-08 | Disposition: A | Discharge status: Home / Self Care | Payer: Medicare Other | Attending: Surgery | Admitting: Surgery

## 2020-06-07 ENCOUNTER — Encounter (HOSPITAL_COMMUNITY): Payer: Self-pay | Admitting: Surgery

## 2020-06-07 DIAGNOSIS — I5022 Chronic systolic (congestive) heart failure: Secondary | ICD-10-CM

## 2020-06-07 DIAGNOSIS — I5042 Chronic combined systolic (congestive) and diastolic (congestive) heart failure: Secondary | ICD-10-CM | POA: Diagnosis present

## 2020-06-07 DIAGNOSIS — I428 Other cardiomyopathies: Principal | ICD-10-CM | POA: Insufficient documentation

## 2020-06-07 DIAGNOSIS — I509 Heart failure, unspecified: Secondary | ICD-10-CM

## 2020-06-07 DIAGNOSIS — L8990 Pressure ulcer of unspecified site, unspecified stage: Secondary | ICD-10-CM | POA: Insufficient documentation

## 2020-06-07 DIAGNOSIS — I4891 Unspecified atrial fibrillation: Secondary | ICD-10-CM | POA: Insufficient documentation

## 2020-06-07 DIAGNOSIS — Z006 Encounter for examination for normal comparison and control in clinical research program: Secondary | ICD-10-CM

## 2020-06-07 DIAGNOSIS — Z79899 Other long term (current) drug therapy: Secondary | ICD-10-CM | POA: Insufficient documentation

## 2020-06-07 DIAGNOSIS — I5032 Chronic diastolic (congestive) heart failure: Secondary | ICD-10-CM | POA: Insufficient documentation

## 2020-06-07 DIAGNOSIS — Z7901 Long term (current) use of anticoagulants: Secondary | ICD-10-CM | POA: Insufficient documentation

## 2020-06-07 HISTORY — PX: BAROREFLEX LEAD INSERTION: EP1257

## 2020-06-07 HISTORY — PX: OTHER SURGICAL HISTORY: SHX169

## 2020-06-07 HISTORY — DX: Presence of automatic (implantable) cardiac defibrillator: Z95.810

## 2020-06-07 LAB — BASIC METABOLIC PANEL
Anion gap: 14 (ref 5–15)
BUN: 11 mg/dL (ref 6–20)
CO2: 21 mmol/L — ABNORMAL LOW (ref 22–32)
Calcium: 9.4 mg/dL (ref 8.9–10.3)
Chloride: 100 mmol/L (ref 98–111)
Creatinine, Ser: 1.02 mg/dL (ref 0.61–1.24)
GFR calc Af Amer: >60 mL/min (ref >60)
GFR calc non Af Amer: >60 mL/min (ref >60)
Glucose, Bld: 96 mg/dL (ref 70–99)
Potassium: 4 mmol/L (ref 3.5–5.1)
Sodium: 135 mmol/L (ref 135–145)

## 2020-06-07 LAB — CBC
HCT: 40.7 % (ref 39.0–52.0)
Hemoglobin: 13.8 g/dL (ref 13.0–17.0)
MCH: 32.7 pg (ref 26.0–34.0)
MCHC: 33.9 g/dL (ref 30.0–36.0)
MCV: 96.4 fL (ref 80.0–100.0)
Platelets: 218 10*3/uL (ref 150–400)
RBC: 4.22 MIL/uL (ref 4.22–5.81)
RDW: 12.9 % (ref 11.5–15.5)
WBC: 12.1 10*3/uL — ABNORMAL HIGH (ref 4.0–10.5)
nRBC: 0 % (ref 0.0–0.2)

## 2020-06-07 LAB — PROTIME-INR
INR: 1.6 — ABNORMAL HIGH (ref 0.8–1.2)
Prothrombin Time: 18.1 seconds — ABNORMAL HIGH (ref 11.4–15.2)

## 2020-06-07 LAB — SURGICAL PCR SCREEN
MRSA, PCR: NEGATIVE
Staphylococcus aureus: NEGATIVE

## 2020-06-07 SURGERY — BAROREFLEX LEAD INSERTION
Anesthesia: General

## 2020-06-07 MED ORDER — SODIUM CHLORIDE 0.9 % IV SOLN
0.0125 ug/kg/min | INTRAVENOUS | Status: DC
  Administered 2020-06-07: .2 ug/kg/min via INTRAVENOUS
  Filled 2020-06-07 (×3): qty 2000

## 2020-06-07 MED ORDER — ASPIRIN EC 325 MG PO TBEC
325.0000 mg | DELAYED_RELEASE_TABLET | Freq: Every day | ORAL | Status: DC
  Administered 2020-06-08: 325 mg via ORAL
  Filled 2020-06-07: qty 1

## 2020-06-07 MED ORDER — PANTOPRAZOLE SODIUM 40 MG PO TBEC
40.0000 mg | DELAYED_RELEASE_TABLET | Freq: Every day | ORAL | Status: DC
  Administered 2020-06-08: 40 mg via ORAL
  Filled 2020-06-07: qty 1

## 2020-06-07 MED ORDER — ONDANSETRON HCL 4 MG/2ML IJ SOLN
INTRAMUSCULAR | Status: DC | PRN
  Administered 2020-06-07: 4 mg via INTRAVENOUS

## 2020-06-07 MED ORDER — PHENOL 1.4 % MT LIQD: 1.0000 | OROMUCOSAL | Status: DC | PRN

## 2020-06-07 MED ORDER — SODIUM CHLORIDE 0.9 % IV SOLN: 500.0000 mL | Freq: Once | INTRAVENOUS | Status: DC | PRN

## 2020-06-07 MED ORDER — VANCOMYCIN HCL IN DEXTROSE 1-5 GM/200ML-% IV SOLN
1000.0000 mg | Freq: Once | INTRAVENOUS | Status: AC
  Administered 2020-06-07: 1000 mg via INTRAVENOUS
  Filled 2020-06-07: qty 200

## 2020-06-07 MED ORDER — LABETALOL HCL 5 MG/ML IV SOLN: 10.0000 mg | INTRAVENOUS | Status: DC | PRN

## 2020-06-07 MED ORDER — FENTANYL CITRATE (PF) 100 MCG/2ML IJ SOLN
INTRAMUSCULAR | Status: AC
  Filled 2020-06-07: qty 2

## 2020-06-07 MED ORDER — ONDANSETRON HCL 4 MG/2ML IJ SOLN: 4.0000 mg | Freq: Four times a day (QID) | INTRAMUSCULAR | Status: DC | PRN

## 2020-06-07 MED ORDER — CHLORHEXIDINE GLUCONATE 0.12 % MT SOLN
OROMUCOSAL | Status: AC
  Administered 2020-06-07: 15 mL
  Filled 2020-06-07: qty 15

## 2020-06-07 MED ORDER — FENTANYL CITRATE (PF) 100 MCG/2ML IJ SOLN: INTRAMUSCULAR | Status: DC | PRN

## 2020-06-07 MED ORDER — HEPARIN (PORCINE) IN NACL 1000-0.9 UT/500ML-% IV SOLN
INTRAVENOUS | Status: AC
  Filled 2020-06-07: qty 500

## 2020-06-07 MED ORDER — HEPARIN (PORCINE) IN NACL 1000-0.9 UT/500ML-% IV SOLN
INTRAVENOUS | Status: DC | PRN
  Administered 2020-06-07: 500 mL

## 2020-06-07 MED ORDER — ROCURONIUM BROMIDE 10 MG/ML (PF) SYRINGE
PREFILLED_SYRINGE | INTRAVENOUS | Status: DC | PRN
  Administered 2020-06-07: 10 mg via INTRAVENOUS
  Administered 2020-06-07: 40 mg via INTRAVENOUS
  Administered 2020-06-07: 20 mg via INTRAVENOUS

## 2020-06-07 MED ORDER — SODIUM CHLORIDE 0.9 % IV SOLN: INTRAVENOUS | Status: DC

## 2020-06-07 MED ORDER — ACETAMINOPHEN 325 MG PO TABS
325.0000 mg | ORAL_TABLET | ORAL | Status: DC | PRN
  Administered 2020-06-07: 650 mg via ORAL
  Filled 2020-06-07: qty 2

## 2020-06-07 MED ORDER — HYDRALAZINE HCL 20 MG/ML IJ SOLN: 5.0000 mg | INTRAMUSCULAR | Status: DC | PRN

## 2020-06-07 MED ORDER — METOPROLOL TARTRATE 5 MG/5ML IV SOLN: 2.0000 mg | INTRAVENOUS | Status: DC | PRN

## 2020-06-07 MED ORDER — CLONAZEPAM 0.25 MG PO TBDP
0.2500 mg | ORAL_TABLET | Freq: Three times a day (TID) | ORAL | Status: DC | PRN
  Administered 2020-06-07 – 2020-06-08 (×2): 0.25 mg via ORAL
  Filled 2020-06-07 (×2): qty 1

## 2020-06-07 MED ORDER — POTASSIUM CHLORIDE CRYS ER 20 MEQ PO TBCR: 20.0000 meq | EXTENDED_RELEASE_TABLET | Freq: Every day | ORAL | Status: DC | PRN

## 2020-06-07 MED ORDER — ACETAMINOPHEN 500 MG PO TABS
1000.0000 mg | ORAL_TABLET | Freq: Once | ORAL | Status: AC
  Administered 2020-06-07: 1000 mg via ORAL
  Filled 2020-06-07 (×2): qty 2

## 2020-06-07 MED ORDER — ACETAMINOPHEN 650 MG RE SUPP: 325.0000 mg | RECTAL | Status: DC | PRN

## 2020-06-07 MED ORDER — SODIUM CHLORIDE 0.9 % IV SOLN
INTRAVENOUS | Status: AC
  Filled 2020-06-07: qty 2

## 2020-06-07 MED ORDER — ALUM & MAG HYDROXIDE-SIMETH 200-200-20 MG/5ML PO SUSP: 15.0000 mL | ORAL | Status: DC | PRN

## 2020-06-07 MED ORDER — MAGNESIUM SULFATE 2 GM/50ML IV SOLN: 2.0000 g | Freq: Every day | INTRAVENOUS | Status: DC | PRN

## 2020-06-07 MED ORDER — DOCUSATE SODIUM 100 MG PO CAPS
100.0000 mg | ORAL_CAPSULE | Freq: Every day | ORAL | Status: DC
  Administered 2020-06-08: 100 mg via ORAL
  Filled 2020-06-07: qty 1

## 2020-06-07 MED ORDER — DEXAMETHASONE SODIUM PHOSPHATE 10 MG/ML IJ SOLN
INTRAMUSCULAR | Status: DC | PRN
  Administered 2020-06-07: 4 mg via INTRAVENOUS

## 2020-06-07 MED ORDER — FENTANYL CITRATE (PF) 100 MCG/2ML IJ SOLN
25.0000 ug | INTRAMUSCULAR | Status: DC | PRN
  Administered 2020-06-07: 25 ug via INTRAVENOUS

## 2020-06-07 MED ORDER — GUAIFENESIN-DM 100-10 MG/5ML PO SYRP: 15.0000 mL | ORAL_SOLUTION | ORAL | Status: DC | PRN

## 2020-06-07 MED ORDER — ETOMIDATE 2 MG/ML IV SOLN
INTRAVENOUS | Status: DC | PRN
  Administered 2020-06-07: 10 mg via INTRAVENOUS

## 2020-06-07 MED ORDER — CLONAZEPAM 0.25 MG PO TBDP: 0.2500 mg | ORAL_TABLET | Freq: Three times a day (TID) | ORAL | Status: DC | PRN

## 2020-06-07 MED ORDER — FENTANYL CITRATE (PF) 100 MCG/2ML IJ SOLN
INTRAMUSCULAR | Status: DC | PRN
  Administered 2020-06-07 (×2): 50 ug via INTRAVENOUS

## 2020-06-07 MED ORDER — MIDAZOLAM HCL 5 MG/5ML IJ SOLN
INTRAMUSCULAR | Status: DC | PRN
  Administered 2020-06-07 (×2): 1 mg via INTRAVENOUS
  Administered 2020-06-07: 2 mg via INTRAVENOUS

## 2020-06-07 MED ORDER — LACTATED RINGERS IV SOLN: INTRAVENOUS | Status: DC

## 2020-06-07 SURGICAL SUPPLY — 7 items
GENERATOR IPG BAROSTIM 2102 (Generator) ×1 IMPLANT
KIT LEAD RESEARCH BATWIRE (Lead) IMPLANT
PACK EP LATEX FREE (CUSTOM PROCEDURE TRAY)
PACK EP LF (CUSTOM PROCEDURE TRAY) IMPLANT
RESEARCH BATWIRE LEAD KIT (Lead) ×2 IMPLANT
SHEATH PROBE COVER 6X72 (BAG) ×1 IMPLANT
TRAY PACEMAKER INSERTION (PACKS) ×1 IMPLANT

## 2020-06-07 NOTE — Anesthesia Preprocedure Evaluation (Signed)
Anesthesia Evaluation  Patient identified by MRN, date of birth, ID band Patient awake    Reviewed: Allergy & Precautions, NPO status , Patient's Chart, lab work & pertinent test results  Airway Mallampati: II  TM Distance: >3 FB Neck ROM: Full    Dental  (+) Dental Advisory Given   Pulmonary former smoker,    breath sounds clear to auscultation       Cardiovascular hypertension, Pt. on medications +CHF  + dysrhythmias Atrial Fibrillation and Ventricular Tachycardia + Cardiac Defibrillator  Rhythm:Regular Rate:Normal     Neuro/Psych CVA    GI/Hepatic negative GI ROS, Neg liver ROS,   Endo/Other  negative endocrine ROS  Renal/GU Renal disease     Musculoskeletal   Abdominal   Peds  Hematology negative hematology ROS (+)   Anesthesia Other Findings   Reproductive/Obstetrics                             Anesthesia Physical Anesthesia Plan  ASA: IV  Anesthesia Plan: General   Post-op Pain Management:    Induction: Intravenous  PONV Risk Score and Plan: 2 and Dexamethasone, Ondansetron and Treatment may vary due to age or medical condition  Airway Management Planned: Oral ETT  Additional Equipment: Arterial line  Intra-op Plan:   Post-operative Plan: Extubation in OR and Possible Post-op intubation/ventilation  Informed Consent: I have reviewed the patients History and Physical, chart, labs and discussed the procedure including the risks, benefits and alternatives for the proposed anesthesia with the patient or authorized representative who has indicated his/her understanding and acceptance.     Dental advisory given  Plan Discussed with: CRNA  Anesthesia Plan Comments:         Anesthesia Quick Evaluation

## 2020-06-07 NOTE — Research (Addendum)
Section A:  Administrative Section  Subject ID: _[id number]_ - _009____ - 015 Subject Initials: __BWE_ ___ ___ Visit Interval:  (* = as required) []   Baseline     [x]  Implant/Pre D/C  Date of Report:   __02___/_JUL_____/_2021______       (DD / MMM / YYYY)  []   1 Month     []  3 Month*  Name of ENT __Christopher MD_  []   6 Month*     []  12 Month*     []  Unscheduled*  Directions:  Baseline: complete sections B-E only   All other visits, complete all sections below (B-G)   Use comments box to document any abnormalities or changes from Baseline  Was the CVRx device turned off? [x]  Yes   []  No, specify reason why not: __________________________________  Was a laryngoscope used? [x]  Yes   []  No  Section B:  Dysphonia   Global Rating of Dysphonia: (Absence of recent upper respiratory infection or recent extensive voice abuse)   [x]   Normal (or clear)   []   Mild   []   Mild to Moderate   []   Moderate   []   Moderate to Severe   []   Severe   []   Aphonic  Section C:  Tongue/Pharynx   Assessment Observation Severity (complete only if abnormal); defined as:    Mild Significant impairment of functioning; patient is unable to carry out usual activities.    Moderate Patient experiences sufficient discomfort to interfere with or reduce their usual level of activity.    Severe Clinically evident impairment of functioning; patient is unable to carry out usual activities.  Pharynx at Rest [x]   Symmetric []   Mild   []  Asymmetric []  Moderate     []  Severe  Pharynx with Phonation ("ah") [x]   Symmetric []   Mild   []  Asymmetric []  Moderate     []  Severe  Tongue Atrophy [x]   Not Present []   Mild   []  Present []  Moderate     []  Severe  Tongue Mobility [x]   Normal []   Mild   []  Reduced []  Moderate     []  Severe  Tongue Protrusion [x]   Midline []   Mild Side: ____________   []  Deviation []  Moderate      []  Severe   Sensation of the Pharynx [x]   Normal []   Mild   []  Decreased []  Moderate     []   Severe  Section D:  House-Brackman Facial Paralysis Scale (Circle One)  Grade Impairment  I  [x]  Normal  II  []  Mild Dysfunction (slight weakness, normal symmetry at rest)  III  []  Moderate Dysfunction (obvious but not disfiguring weakness with synkinesis, normal symmetry at rest); Complete eye closure with maximal effort; Good forehead movement  IV  []  Moderately severe dysfunction (obvious and disfiguring asymmetry, significant synkinesis); Incomplete eye closure; moderate forehead movement  V  []  Severe dysfunction (barely perceptible motion  VI  []  Total paralysis (no movement)  Section E:  Baseline Cranial Nerve Damage  Does the subject have a presence of baseline cranial nerve dysfunction []  Yes, specify what dysfunction: _____________________________________   [x]  No  Section F:  ENT Notes (Pre-D/C and Follow-Up)  Were changes in Dysphonia from baseline or any abnormalities related to the [facility] Implant? []   Yes   [x]  No   []  Unknown   []  Not Applicable  Were changes in Tongue/Pharynx from baseline or any abnormalities related to the [facility] Implant? []   Yes   [x]   No   []  Unknown   []  Not Applicable  Were changes in House-Brackman Facial Paralysis Scale from baseline or any abnormalities related to the [facility] Implant? []   Yes   [x]  No   []  Unknown   []  Not Applicable  Were any of the above changes from baseline potentially related to the intubation for the procedure? []   Yes   [x]  No   []  Unknown   []  Not Applicable  Section G:  Cranial Nerves  Has there been new cranial nerve dysfunction since Baseline?   Marginal mandibular branch of cranial nerve VII (Facial Nerve) []  Yes, specify what changed: ___________________________________   [x]  No  Cranial nerve IX (Glossopharyngeal Nerve) []  Yes, specify what changed: ___________________________________   [x]  No  Cranial nerve X  (Vagus Nerve) []  Yes, specify what changed: ___________________________________   [x]  No   Cranial nerve XI  (Accessory Nerve) []  Yes, specify what changed: ___________________________________   [x]  No  Cranial nerve XII  (Hypoglossal Nerve) []  Yes, specify what changed: ___________________________________   [x]  No  Section H:  Comments/Notes/Describe any abnormalities/deficiencies (as well if post-operative change)

## 2020-06-07 NOTE — H&P (Signed)
CC: CHF  Chad Christensen is a 54 y.o. male who presents today for routine electrophysiology followup.  Since last being seen in our clinic, the patient reports doing reasonably well.   He has SOB with moderate activity and is unsteady.  Walks with a walker now.  He has NYHA Class III CH.   Today, he denies symptoms of palpitations, chest pain,  lower extremity edema, dizziness, presyncope, syncope, or ICD shocks.  The patient is otherwise without complaint today.       Past Medical History:  Diagnosis Date  . Gout   . Hypertension   . Paroxysmal atrial fibrillation (HCC)   . Polysubstance abuse (HCC)    In remission since 2003  . Renal insufficiency   . Systolic heart failure    Secondary to nonischemic cardiomyopathy  . Tobacco user   . Ventricular tachycardia (HCC) 04/23/12   CL with appropriate ICD shock delivered    ROS- all systems are reviewed and negative except as per HPI above  Medicines reviewed  Physical Exam: Vitals:   06/07/20 0920  BP: 114/80  Pulse: 80  Resp: 17  Temp: 97.8 F (36.6 C)  TempSrc: Oral  SpO2: 100%  Weight: 48.6 kg  Height: 6' (1.829 m)    GEN- The patient is thin and chronically ill appearing, alert and oriented x 3 today.   Head- normocephalic, atraumatic Eyes-  Sclera clear, conjunctiva pink Ears- hearing intact Oropharynx- clear Neck- supple,   Lungs- normal work of breathing Chest- ICD pocket is without hematoma Heart- Regular rate and rhythm,  GI- soft  Extremities- no clubbing, cyanosis, or edema Neuro- strength and sensation are intact   ekg tracing from 05/10/2020 is personally reviewed and shows sinus rhythm, narrow QRS (QRS is 128 msec not 140 msec)   Assessment and Plan:  1.  Chronic systolic dysfunction/ nonischemic CM He has NYHA Class III CHF Stable on an appropriate medical regimen He does not meet criteria for upgrade to CRT given QRS 128 msec.  He is planned for barostim activation therapy.   He is enrolled in [place] trial. Risks, benefits, and alternatives to Batwire technique for barostim activation therapy were discussed in detail today.  He is aware that this could convert to a surgical procedure if [place] is not successful.   The patient understands that risks include but are not limited to bleeding, infection,  Neuro vascular damage, damage to face, throat, and airway structures, MI, stroke, death, persistent paralysis and lead dislodgement and wishes to proceed.      2. VT/VF Stable ICD therapies are turned off for the procedure.  We will turn them on afterwards.  3. HTN Stable No change required today  4. afib Well controlled Coumadin is on hold  Hillis Range MD, [facility] 06/07/2020 10:40 AM

## 2020-06-07 NOTE — Transfer of Care (Signed)
Immediate Anesthesia Transfer of Care Note  Patient: Chad Christensen  Procedure(s) Performed: BAROREFLEX LEAD INSERTION (N/A )  Patient Location: Cath Lab  Anesthesia Type:General  Level of Consciousness: awake and patient cooperative  Airway & Oxygen Therapy: Patient Spontanous Breathing and Patient connected to nasal cannula oxygen  Post-op Assessment: Report given to RN, Post -op Vital signs reviewed and stable and Patient moving all extremities  Post vital signs: Reviewed and stable  Last Vitals:  Vitals Value Taken Time  BP    Temp    Pulse    Resp    SpO2      Last Pain:  Vitals:   06/07/20 1045  TempSrc:   PainSc: 0-No pain         Complications: No complications documented.

## 2020-06-07 NOTE — H&P (Signed)
   Patient name: Chad Christensen MRN: [id number] DOB: [birth year] Sex: male   HISTORY OF PRESENT ILLNESS:   Chad Christensen is a 54 y.o. male with class II heart failure who presents for BATWIRE device impant  CURRENT MEDICATIONS:    Current Facility-Administered Medications  Medication Dose Route Frequency Provider Last Rate Last Admin  . acetaminophen (TYLENOL) tablet 1,000 mg  1,000 mg Oral Once Marcene Duos, MD      . lactated ringers infusion   Intravenous Continuous Marcene Duos, MD      . vancomycin (VANCOCIN) IVPB 1000 mg/200 mL premix  1,000 mg Intravenous Once Nada Libman, MD        REVIEW OF SYSTEMS:   [X]  denotes positive finding, [ ]  denotes negative finding Cardiac  Comments:  Chest pain or chest pressure:    Shortness of breath upon exertion:    Short of breath when lying flat:    Irregular heart rhythm:    Constitutional    Fever or chills:      PHYSICAL EXAM:   Vitals:   06/07/20 0920  BP: 114/80  Pulse: 80  Resp: 17  Temp: 97.8 F (36.6 C)  TempSrc: Oral  SpO2: 100%  Weight: 48.6 kg  Height: 6' (1.829 m)    GENERAL: The patient is a well-nourished male, in no acute distress. The vital signs are documented above. CARDIOVASCULAR: There is a regular rate and rhythm. PULMONARY: Non-labored respirations   STUDIES:   I evaluated his right carotid with u/s and he has appropriate anatomy   MEDICAL ISSUES:   We discussed the details of the procedure as well as the potential risks.  All questions answered.  08/08/20, MD, FACS [facility] [phone] Pager [phone]

## 2020-06-07 NOTE — Anesthesia Procedure Notes (Signed)
Procedure Name: Intubation Date/Time: 06/07/2020 11:56 AM Performed by: Moshe Salisbury, CRNA Pre-anesthesia Checklist: Patient identified, Emergency Drugs available, Suction available and Patient being monitored Patient Re-evaluated:Patient Re-evaluated prior to induction Oxygen Delivery Method: Circle System Utilized Preoxygenation: Pre-oxygenation with 100% oxygen Induction Type: IV induction Ventilation: Mask ventilation without difficulty Laryngoscope Size: Mac and 4 Grade View: Grade II Tube type: Oral Tube size: 8.0 mm Number of attempts: 1 Airway Equipment and Method: Stylet Placement Confirmation: ETT inserted through vocal cords under direct vision,  positive ETCO2 and breath sounds checked- equal and bilateral Secured at: 22 cm Tube secured with: Tape Dental Injury: Teeth and Oropharynx as per pre-operative assessment

## 2020-06-08 ENCOUNTER — Encounter (HOSPITAL_COMMUNITY): Payer: Self-pay | Admitting: Surgery

## 2020-06-08 DIAGNOSIS — L8990 Pressure ulcer of unspecified site, unspecified stage: Secondary | ICD-10-CM | POA: Insufficient documentation

## 2020-06-08 DIAGNOSIS — I428 Other cardiomyopathies: Secondary | ICD-10-CM

## 2020-06-08 DIAGNOSIS — I5022 Chronic systolic (congestive) heart failure: Secondary | ICD-10-CM

## 2020-06-08 DIAGNOSIS — I480 Paroxysmal atrial fibrillation: Secondary | ICD-10-CM

## 2020-06-08 DIAGNOSIS — I509 Heart failure, unspecified: Secondary | ICD-10-CM

## 2020-06-08 LAB — CBC
HCT: 39.7 % (ref 39.0–52.0)
Hemoglobin: 13.6 g/dL (ref 13.0–17.0)
MCH: 33.5 pg (ref 26.0–34.0)
MCHC: 34.3 g/dL (ref 30.0–36.0)
MCV: 97.8 fL (ref 80.0–100.0)
Platelets: 203 10*3/uL (ref 150–400)
RBC: 4.06 MIL/uL — ABNORMAL LOW (ref 4.22–5.81)
RDW: 13 % (ref 11.5–15.5)
WBC: 9.7 10*3/uL (ref 4.0–10.5)
nRBC: 0 % (ref 0.0–0.2)

## 2020-06-08 LAB — BASIC METABOLIC PANEL
Anion gap: 10 (ref 5–15)
BUN: 12 mg/dL (ref 6–20)
CO2: 24 mmol/L (ref 22–32)
Calcium: 9.2 mg/dL (ref 8.9–10.3)
Chloride: 100 mmol/L (ref 98–111)
Creatinine, Ser: 0.95 mg/dL (ref 0.61–1.24)
GFR calc Af Amer: >60 mL/min (ref >60)
GFR calc non Af Amer: >60 mL/min (ref >60)
Glucose, Bld: 101 mg/dL — ABNORMAL HIGH (ref 70–99)
Potassium: 4.6 mmol/L (ref 3.5–5.1)
Sodium: 134 mmol/L — ABNORMAL LOW (ref 135–145)

## 2020-06-08 NOTE — Care Management CC44 (Signed)
Condition Code 44 Documentation Completed  Patient Details  Name: UNNAMED ZEIEN MRN: [id number] Date of Birth: [birth year]   Condition Code 44 given:  Yes Patient signature on Condition Code 44 notice:  Yes Documentation of 2 MD's agreement:  Yes Code 44 added to claim:  Yes    Bess Kinds, RN 06/08/2020, 10:47 AM

## 2020-06-08 NOTE — Discharge Instructions (Signed)
    Supplemental Discharge Instructions for  Barostim Neo Patients  Activity No heavy lifting or vigorous activity with your left/right arm for 1weeks.   WOUND CARE - Keep the wound area clean and dry.  Do not get this area wet for one week. No showers for one week;   - No bandage is needed on the site.  DO  NOT apply any creams, oils, or ointments to the wound area. - If you notice any drainage or discharge from the wound, any swelling or bruising at the site, or you develop a fever > 101? F after you are discharged home, call the office at once.  Special Instructions - You are still able to use cellular telephones;   - Avoid arc welding equipment, MRI testing (magnetic resonance imaging), TENS units (transcutaneous nerve stimulators).  Call the office for questions about other devices. - Microwave ovens are safe to be near or to operate.

## 2020-06-08 NOTE — Discharge Summary (Signed)
ELECTROPHYSIOLOGY PROCEDURE DISCHARGE SUMMARY    Patient ID: Chad Christensen,  MRN: [id number], DOB/AGE: [birth year] 54 y.o.  Admit date: 06/07/2020 Discharge date: 06/08/2020  Primary Cardiologist: Dr Shirlee Latch Electrophysiologist: Dr [name]  Primary Discharge Diagnosis:  Nonischemic CM, chronic systolic dysfunction  Secondary Discharge Diagnosis:  Atrial fibrillation History of ventricular fibrillation Hypertension   Allergies  Allergen Reactions  . Morphine And Related Anaphylaxis, Shortness Of Breath and Rash  . Penicillins Rash    Has patient had a PCN reaction causing immediate rash, facial/tongue/throat swelling, SOB or lightheadedness with hypotension: Yes Has patient had a PCN reaction causing severe rash involving mucus membranes or skin necrosis: Unknown Has patient had a PCN reaction that required hospitalization: No Has patient had a PCN reaction occurring within the last 10 years: No If all of the above answers are "NO", then may proceed with Cephalosporin use.      Procedures This Admission:  1.  Implantation of a CVRx Barostim Neo baroreflexive activation therapy device using Batwire technique on 06/07/20 by Drs Durene Cal and Hillis Range.  The patient received a CVRx Barostim Neo device model 2102 (SN [id number])  device with a CVRx Neo CSL Model 1036 (SN [id number]) lead according to the [facility] research protocol.  There were no immediate post procedure complications.  Consultations: ENT- Dr Narda Bonds 06/07/20  Brief HPI: Chad Christensen is a 54 y.o. male was referred to BAT team in the outpatient setting for consideration of Barostim Neo implantation.  Past medical history includes nonischemic CM and NYHA Class III CHF.  The patient has persistent LV dysfunction despite guideline directed therapy.  He is s/p prior ICD.  Given narrow QRS (baseline 128 msec), he is not a candidate for CRT.  Further medicine optimization has been limited by previous renal  failure and also chronic hypotension.  Hospital Course:  The patient was admitted and underwent implantation of a CVRx Barostim Neo device using Batwire technique with details as outlined above. He was monitored on telemetry overnight which demonstrated sinus rhythm.  Right neck and chest was without hematoma or ecchymosis.    Wound care and restrictions were reviewed with the patient. He was evaluated by Dr Ezzard Standing with ENT with no post procedure concerns noted.  He was also evaluated by Dr Kemper Durie from Vascular surgery and Dr [name] with EP prior to discharge and considered stable for discharge to home.   Further medicine optimization was limited by low BP.  Physical Exam: Vitals:   06/07/20 2356 06/08/20 0352 06/08/20 0845 06/08/20 0847  BP: 110/68 90/67 104/76 104/76  Pulse: 88 76  61  Resp:  18 18 17   Temp: 98 F (36.7 C) 98 F (36.7 C)  97.6 F (36.4 C)  TempSrc: Oral Oral  Oral  SpO2:  100%  100%  Weight:  46.7 kg    Height:        GEN- The patient is thin and frail appearing, alert and oriented x 3 today.  + bitemporal wasting HEENT: normocephalic, atraumatic; sclera clear, conjunctiva pink; hearing intact; oropharynx clear with poor dentition; neck supple, no JVP,  Surgical site was without hematoma/ bleeding Lungs- Clear to ausculation bilaterally, normal work of breathing.  No wheezes, rales, rhonchi Heart- Regular rate and rhythm, no murmurs, rubs or gallops, PMI not laterally displaced GI- soft, non-tender, non-distended, bowel sounds present, no hepatosplenomegaly Extremities- no clubbing, cyanosis, or edema; DP/PT/radial pulses 2+ bilaterally MS- no significant deformity or atrophy Skin- warm and dry, no  rash or lesion, right chest without hematoma/ecchymosis Psych- euthymic mood, full affect Neuro- CN II-XII intact, strength and sensation are intact, no abnormal sequela s/p batwire implant   Labs:   Lab Results  Component Value Date   WBC 9.7 06/08/2020   HGB  13.6 06/08/2020   HCT 39.7 06/08/2020   MCV 97.8 06/08/2020   PLT 203 06/08/2020    Recent Labs  Lab 06/08/20 0446  NA 134*  K 4.6  CL 100  CO2 24  BUN 12  CREATININE 0.95  CALCIUM 9.2  GLUCOSE 101*    Discharge Medications:  Allergies as of 06/08/2020      Reactions   Morphine And Related Anaphylaxis, Shortness Of Breath, Rash   Penicillins Rash   Has patient had a PCN reaction causing immediate rash, facial/tongue/throat swelling, SOB or lightheadedness with hypotension: Yes Has patient had a PCN reaction causing severe rash involving mucus membranes or skin necrosis: Unknown Has patient had a PCN reaction that required hospitalization: No Has patient had a PCN reaction occurring within the last 10 years: No If all of the above answers are "NO", then may proceed with Cephalosporin use.      Medication List    TAKE these medications   albuterol 1.25 MG/3ML nebulizer solution Commonly known as: ACCUNEB Take 1.25 mg by nebulization every 6 (six) hours as needed for wheezing.   amiodarone 200 MG tablet Commonly known as: PACERONE Take 1 tablet (200 mg total) by mouth daily.   clonazePAM 0.5 MG tablet Commonly known as: KLONOPIN Take 0.5 mg by mouth 3 (three) times daily.   Colcrys 0.6 MG tablet Generic drug: colchicine Take 0.6 mg by mouth 2 (two) times daily as needed (for gout flare up).   dapagliflozin propanediol 10 MG Tabs tablet Commonly known as: Farxiga Take 1 tablet (10 mg total) by mouth daily before breakfast.   digoxin 0.125 MG tablet Commonly known as: LANOXIN Take 1 tablet (0.125 mg total) by mouth daily. Telehealth visit 12/2019 with Dr. [name].   furosemide 40 MG tablet Commonly known as: LASIX Take 40 mg by mouth daily as needed for fluid.   mirtazapine 15 MG tablet Commonly known as: REMERON Take 15 mg by mouth at bedtime.   nystatin 100000 UNIT/ML suspension Commonly known as: MYCOSTATIN Take 5 mLs by mouth daily as needed (thrush).    pramipexole 0.125 MG tablet Commonly known as: MIRAPEX Take 0.125 mg by mouth 3 (three) times daily as needed (restless leg).   rOPINIRole 1 MG tablet Commonly known as: REQUIP Take 1 mg by mouth 3 (three) times daily.   simvastatin 5 MG tablet Commonly known as: ZOCOR Take 5 mg by mouth daily.   spironolactone 25 MG tablet Commonly known as: ALDACTONE Take 1 tablet (25 mg total) by mouth daily. *Patient is overdue for an appointment and needs to call and schedule for further refills* What changed: additional instructions   warfarin 2 MG tablet Commonly known as: COUMADIN Take 2 mg by mouth daily.       Disposition:    Follow-up Information    [facility] Follow up on 06/18/2020.   Specialty: Cardiology Why: at 2:30 pm for wound check and research visit Contact information: [address], [address] [place] [address] [phone]              Duration of Discharge Encounter: Greater than 30 minutes including physician time.  Randolm Idol MD 06/08/2020 9:04 AM

## 2020-06-08 NOTE — Progress Notes (Signed)
[facility]  Subjective  -no complaints.   Objective 90/67 76 98 F (36.7 C) (Oral) 18 100%  Intake/Output Summary (Last 24 hours) at 06/08/2020 0805 Last data filed at 06/08/2020 0353 Gross per 24 hour  Intake 240 ml  Output 725 ml  Net -485 ml    Right neck incision and chest wall incision are clean dry and intact with implant and no signs of hematoma  Laboratory Lab Results: Recent Labs    06/07/20 1030 06/08/20 0446  WBC 12.1* 9.7  HGB 13.8 13.6  HCT 40.7 39.7  PLT 218 203   BMET Recent Labs    06/07/20 1030 06/08/20 0446  NA 135 134*  K 4.0 4.6  CL 100 100  CO2 21* 24  GLUCOSE 96 101*  BUN 11 12  CREATININE 1.02 0.95  CALCIUM 9.4 9.2    COAG Lab Results  Component Value Date   INR 1.6 (H) 06/07/2020   INR 1.9 (H) 05/10/2020   INR 1.05 03/03/2018   No results found for: PTT  Assessment/Planning: POD #1 status post barostem.  Has done well overnight.  The neck incision as well as the chest wall incision all look clean dry and intact.  Patient states he has follow-up with Dr. [name] in several weeks.  My understanding is Dr. [name] and cardiology are to discharge the patient today.  Stable for discharge from my standpoint.  Cephus Shelling 06/08/2020 8:05 AM --

## 2020-06-08 NOTE — Care Management Obs Status (Signed)
MEDICARE OBSERVATION STATUS NOTIFICATION   Patient Details  Name: Chad Christensen MRN: [id number] Date of Birth: 19-Sep-1966   Medicare Observation Status Notification Given:  Yes    Bess Kinds, RN 06/08/2020, 10:47 AM

## 2020-06-11 ENCOUNTER — Encounter (HOSPITAL_COMMUNITY): Payer: Self-pay | Admitting: Internal Medicine

## 2020-06-11 MED FILL — Gentamicin Sulfate Inj 40 MG/ML: INTRAMUSCULAR | Qty: 80 | Status: AC

## 2020-06-12 ENCOUNTER — Other Ambulatory Visit (HOSPITAL_COMMUNITY): Payer: Self-pay | Admitting: Surgery

## 2020-06-12 DIAGNOSIS — Z006 Encounter for examination for normal comparison and control in clinical research program: Secondary | ICD-10-CM

## 2020-06-13 ENCOUNTER — Telehealth: Payer: Self-pay | Admitting: *Deleted

## 2020-06-13 ENCOUNTER — Telehealth (HOSPITAL_COMMUNITY): Payer: Self-pay | Admitting: Licensed Clinical Social Worker

## 2020-06-13 NOTE — Research (Signed)
Section A:  Administrative Section  Subject ID: _[id number]__ - _009_ - 015 Subject Initials: _B_ _W_ _E_  Visit Interval:    []   Screening/Baseline    [x]   Activation   []   0.5 Month       []   1 Month     []   2 Month     []   3 Month       []   6 Month     []   12 Month         []   Unscheduled, reason for visit: _________________________________________  Section B:  Physical Assessment   Date: _02_/_Jul_/_2021_     (DD / MMM / YYYY)   Weight: _103_  []  kg [x]  pounds  Blood Pressure: _116_ / _74_mmHg Heart Rate: _106_ bpm  Section B:  Device Information   Battery Battery Voltage: 2.98 V   Battery Life: 140 Months   RRT Date: 05-Feb-2032 (DD/MMM/YYYY)  Lead Impeadance Right Lead __575_______ Ohms    []   Low    []   High   Left Lead _________ Ohms    []   Low    [x]   High  Programmed Settings Pathway Pulse Width Amplitude Frequency   []   Left 125 1 40   [x]   Right     Section C:  Programming Information (12 Month and Unscheduled - not required)   [x]  Programming Not Done   Date: ____/_______/_____     (DD / MMM / ) Device information, therapy schedule and programmed settings can be found on the session summary report  Site Clinician Present: _________________________________________________________  CV[employer] Employee helping with programming: _____________________________________ []   N/A  Location of CVRx person: []   []  Onsite Remote  Subject Experience   1. Did the subject experience transient bradycardia or hypotension during device testing? []   []  Yes No   2. Did the subject experience transient electrical stimulation of non-vascular tissues? []  []  Yes No   3. If yes to either question above, was intervention beyond reprogramming needed or was it associated with an additional untoward event? []   []  Yes No   4a. Is there any indication that there has been unacceptable device interaction between the CVRx device and other implanted electrical stimulators or sensors  device? []   []  []  Yes No N/A (no other device)   4b. 4b. If "Yes" to question 4a, please describe the device interaction and corrective action taken:                  Section D:  Arrythmia Interventions  Has the subject received a cardiac ablation since the last study visit? []    Yes  Date of last procedure: ____/____/_____ (DD/MMM/YYYY)   [x]  No   []  Unknown   If yes, what type and # of ablations Type []  Atrial     []  Ventricular    # of procedures: ____________   Was this pre-planned prior to CVRx implant? []  Yes    []  No (complete/update AE form)  Has the subject received a cardioversion since the last study visit? []  Yes  Date of last procedure: ____/____/_____ (DD/MMM/YYYY)   [x]  No   []  Unknown   If yes, what type and # of cardioversions? Type []  Medications     []  Electrical Shock    # of procedures: _____________   Was this pre-planned prior to CVRx implant? []  Yes    []  No (complete/update  AE form)  Section E:  Adverse Events  Have any new adverse events or updates to existing events occurred since last visit? []  Yes (complete/update AE form)   [x]  No  Section F:  Medication Changes   Have there been any changes to the subject's home use medications for Arrhythmia, Antiplatelet/Anticoagulation and Heart failure medications since the last visit? [x]  Yes (update Med form)   []  No  Section G:  Comments     n/a              Section H:  Signature    Person completing form (Print Name): ___Kimberly  :)    Signature: ___Kimberly :) _ Date: _07/08/2021__

## 2020-06-13 NOTE — Telephone Encounter (Addendum)
CSW attempted to call pt to confirm he has transportation to appt next week- pt states that he does not have a ride as his cousin who has driven him in the past is now teaching summer school and unable to drive him during the day.  CSW able to schedule transport through [facility] for appt next Friday  Burna Sis, LCSW Clinical Social Worker [facility] Desk#: [phone] Cell#: [phone]

## 2020-06-13 NOTE — Research (Signed)
Patient ID: _[id number] - _[id number]_ - 015 Patient Initials: _B_ _W_ _E_  Visit Interval:    []   Screening/Baseline    [x]  Activation   []  0.5 Month       []  1 Month     []  2 Month     []  3 Month       []  6 Month     []  12 Month         []  Unscheduled, reason for visit: _________________________________________  SECTION B:  COVID-19 Like Illness Symptoms   Has the subject experienced any cold, flu or COVID-19 symptoms in the past 30 days? [x]   No  (skip to section C)     []  Yes, date of onset:  _____/_____ MMM/YYYY    If yes, check all symptoms that apply:   []  Fevers or chills   []   New or Worsening Cough  []  Productive  []  Dry     If yes to cough, indicate severity  []  Constant  []  Occasional, several per hour   []  New or worsened shortness of breath   []  Diarrhea   []  Altered or reduced sense of smell or taste   []  Muscle aches/Severe Fatigue   []  Chest pain or tightness   []  Sore throat   []  Nausea or vomiting  SECTION C:  COVID-19 Like Illness Testing   Has the patient been tested for COVID-19? []  No     [x]  Yes, date of test:  _jun_/_2021____ MMM/YYYY    If yes, test results:   []  Positive [x]  Negative []  Unknown  Has the patient been tested for COVID-19 Antibodies? [x]  No     []  Yes, date of test:  _____/_____ MMM/YYYY    If yes, test results:   []  Positive []  Negative []  Unknown  Has the patient been vaccinated for COVID-19? []  No     [x]  Yes, date of test:  _MAY_/_2021__ MMM/YYYY  Has the patient been tested for Influenza ("flu")? [x]  No     []  Yes, date of test:  _____/_____ MMM/YYYY    If yes, test results:   []  Positive []  Negative []  Unknown  Has the patient been vaccinated for Influenza ("flu")? [x]  No     []  Yes, date of test:  _____/_____ MMM/YYYY  SECTION D:  COVID-19 Like Illness Exposure  Has the subject been told that they might have had COVID-19/have symptoms suggestive of COVID-19? [x]    No   []  Yes   []   Unknown  Has the subject been exposed to anyone  with known or suspected COVID-19? [x]    No   []  Yes   []   Unknown  Has the subject been told that they might have had the "flu" or influenza? [x]    No   []  Yes   []   Unknown  SECTION E:  Effects of COVID Pandemic on Subject's Healthcare Interactions  Since the last study visit, did the subject feel the need to go to an emergency department or hospital for their heart failure but decided not to because of concerns about COVID-19? [x]   No   []  Yes   []  Unknown    If yes, how did they seek care (check all that apply):   []  Telemedicine visit []  In-person []  Clinic []  Urgent Care   []  Subject did not change hospital/ER use due to COVID-19 []  Other, specify: ________________________  Since the last study visit, did the subject have a cardiology/HF related appointment cancelled/rescheduled due to  COVID-19 pandemic? [x]   No   []  Yes, how many? ___________   []  Unknown  Since the last study visit, did the subject have any cardiology/HF related telemedicine visit due to COVID-19 pandemic? [x]   No   []  Yes, how many? ___________   []  Unknown  Since the last study visit, did the subject have a cardiology/HF procedure cancelled/rescheduled due to COVID-19 pandemic? [x]   No   []  Yes, how many? ___________   []  Unknown  SECTION F:  Effects of COVID Pandemic on Subject's Medications  Since the last visit, did any of their heart failure medications change or stop, even for a short time?   If yes, enter change into medication eCRF [x]   No   []  Yes, how many? ___________   []  Unknown    If yes, why:   []  Instructed by Doctor []  Self-discontinued []  Unknown []  Other: ________________  Since the last visit, was the subject prescribed any medications for COVID-19? [x]   No   []  Yes   []  Unknown    If yes, what medications (generic name):  SECTION G:  Effects of COVID Pandemic on Subject's Lifestyle  How has the subject's activity/exercise level changed due to COVID-19 pandemic? [x]   No change   []  More  activity/exercise   []  Less activity/exercise  How has the subject's smoking habits changed due to COVID-19? [x]   Does not smoke   []  No change   []  Smoke more   []  Smoke less  How has the subject's alcohol drinking habits changed due to COVID-19? []   Does not drink   [x]  No change   []  Drink more   []  Drink less  Section H:  Signature    Person completing form (Print Name): ____Kimberly :) ____________________________    Signature: ___Kimberly :) ________ Date: __07/02/2021__

## 2020-06-13 NOTE — Telephone Encounter (Signed)
Transportation set up for Mr Delaine for his batwire research 0.72M visit. Appt date is 06/18/2020 @ 230. Ride will pick up from his house around 130 and drop off at [facility] then plan to pick up around 330 to take patient back home.

## 2020-06-15 NOTE — Anesthesia Postprocedure Evaluation (Signed)
Anesthesia Post Note  Patient: Chad Christensen  Procedure(s) Performed: BAROREFLEX LEAD INSERTION (N/A )     Patient location during evaluation: PACU Anesthesia Type: General Level of consciousness: awake and alert Pain management: pain level controlled Vital Signs Assessment: post-procedure vital signs reviewed and stable Respiratory status: spontaneous breathing, nonlabored ventilation, respiratory function stable and patient connected to nasal cannula oxygen Cardiovascular status: blood pressure returned to baseline and stable Postop Assessment: no apparent nausea or vomiting Anesthetic complications: no   No complications documented.  Last Vitals:  Vitals:   06/08/20 0845 06/08/20 0847  BP: 104/76 104/76  Pulse:  61  Resp: 18 17  Temp:  36.4 C  SpO2:  100%    Last Pain:  Vitals:   06/08/20 0847  TempSrc: Oral  PainSc: 0-No pain                 Tre Sanker

## 2020-06-16 NOTE — Op Note (Signed)
    Patient name: Chad Christensen MRN: [id number] DOB: 28-Dec-1965 Sex: male  06/07/2020 Pre-operative Diagnosis: Congestive heart failure Post-operative diagnosis:  Same Surgeon:  Durene Cal Co-surgeon: Hillis Range Procedure:   #1: Barrow reflexive activation therapy (Barostim) implant using Batwire technique   #2: Carotid duplex ultrasound   #3: Defibrillation interrogation and reprogramming Anesthesia: General Blood Loss: Minimal Specimens: None  Findings: Excellent hemodynamic response on testing  Indications: This is a 54 year old gentleman with nonischemic cardiomyopathy, [place] HA class III heart failure.  He comes in today for implantation of the Barostim that wire device trial  Procedure:  The patient was identified in the holding area and taken to [facility] CATH LAB 6  The patient was then placed supine on the table. general anesthesia was administered.  The patient was prepped and draped in the usual sterile fashion.  A time out was called and antibiotics were administered.  Please see Dr. Jenel Lucks procedure note for full details of the procedure.  I was present for the duration of the procedure.  I performed ultrasound evaluation as well as percutaneous implant of the electrode on the carotid bifurcation.  I assisted with the tunneling of the wiring as well as generator implant.   Disposition: To PACU stable.   Juleen China, M.D., [facility] [place] Office: [phone] Pager:  [phone]

## 2020-06-17 ENCOUNTER — Ambulatory Visit (INDEPENDENT_AMBULATORY_CARE_PROVIDER_SITE_OTHER): Payer: Medicare Other

## 2020-06-17 DIAGNOSIS — I5022 Chronic systolic (congestive) heart failure: Secondary | ICD-10-CM | POA: Diagnosis not present

## 2020-06-17 DIAGNOSIS — Z95810 Presence of automatic (implantable) cardiac defibrillator: Secondary | ICD-10-CM

## 2020-06-18 ENCOUNTER — Telehealth: Payer: Self-pay

## 2020-06-18 ENCOUNTER — Encounter: Payer: Medicare Other | Admitting: *Deleted

## 2020-06-18 ENCOUNTER — Other Ambulatory Visit: Payer: Self-pay

## 2020-06-18 VITALS — BP 109/70 | HR 70 | Wt 102.6 lb

## 2020-06-18 DIAGNOSIS — Z006 Encounter for examination for normal comparison and control in clinical research program: Secondary | ICD-10-CM

## 2020-06-18 NOTE — Telephone Encounter (Signed)
Remote ICM transmission received.  Attempted call to patient regarding ICM remote transmission and no answer or answering machine. 

## 2020-06-18 NOTE — Research (Addendum)
Chad Christensen is here for his 0.5 month postop Batwire visit and is doing well. He reports that he is walking better and sometimes can get around inside the house without using his walker. He also thinks that his memory is getting better.    His neck/chest wounds were observed to be c/d/i and he has been afebrile. He reports that he has no issues with eating or swallowing at this time; His thrush problem has resolved. He is seen today in conjunction with [employer] rep., Nadeen Landau, who reprogrammed his device. Patient reported no problems or pain during this titration.   Patient ID: _[id number]_ - _009_ - 015 Patient Initials: _B W..E_  Visit Interval:    []   Screening/Baseline    []  Activation   [x]  0.5 Month       []  1 Month     []  2 Month     []  3 Month       []  6 Month     []  12 Month         []  Unscheduled, reason for visit: _________________________________________  SECTION B:  COVID-19 Like Illness Symptoms   Has the subject experienced any cold, flu or COVID-19 symptoms in the past 30 days? [x]   No  (skip to section C)     []  Yes, date of onset:  _____/_____ MMM/YYYY    If yes, check all symptoms that apply:   []  Fevers or chills   []   New or Worsening Cough  []  Productive  []  Dry     If yes to cough, indicate severity  []  Constant  []  Occasional, several per hour   []  New or worsened shortness of breath   []  Diarrhea   []  Altered or reduced sense of smell or taste   []  Muscle aches/Severe Fatigue   []  Chest pain or tightness   []  Sore throat   []  Nausea or vomiting  SECTION C:  COVID-19 Like Illness Testing   Has the patient been tested for COVID-19? []  No     [x]  Yes, date of test:  _JUN__/_2021__ MMM/YYYY    If yes, test results:   []  Positive [x]  Negative []  Unknown  Has the patient been tested for COVID-19 Antibodies? [x]  No     []  Yes, date of test:  _____/_____ MMM/YYYY    If yes, test results:   []  Positive []  Negative []  Unknown  Has the patient been vaccinated for COVID-19?  []  No     [x]  Yes, date of test:  __MAY_/_2021__ MMM/YYYY  Has the patient been tested for Influenza ("flu")? [x]  No     []  Yes, date of test:  _____/_____ MMM/YYYY    If yes, test results:   []  Positive []  Negative []  Unknown  Has the patient been vaccinated for Influenza ("flu")? [x]  No     []  Yes, date of test:  _____/_____ MMM/YYYY  SECTION D:  COVID-19 Like Illness Exposure  Has the subject been told that they might have had COVID-19/have symptoms suggestive of COVID-19? [x]    No   []  Yes   []   Unknown  Has the subject been exposed to anyone with known or suspected COVID-19? [x]    No   []  Yes   []   Unknown  Has the subject been told that they might have had the "flu" or influenza? [x]    No   []  Yes   []   Unknown  SECTION E:  Effects of COVID Pandemic on Subject's Healthcare Interactions  Since the last study visit, did the subject feel the need to go to an emergency department or hospital for their heart failure but decided not to because of concerns about COVID-19? [x]   No   []  Yes   []  Unknown    If yes, how did they seek care (check all that apply):   []  Telemedicine visit []  In-person []  Clinic []  Urgent Care   []  Subject did not change hospital/ER use due to COVID-19 []  Other, specify: ________________________  Since the last study visit, did the subject have a cardiology/HF related appointment cancelled/rescheduled due to COVID-19 pandemic? [x]   No   []  Yes, how many? ___________   []  Unknown  Since the last study visit, did the subject have any cardiology/HF related telemedicine visit due to COVID-19 pandemic? [x]   No   []  Yes, how many? ___________   []  Unknown  Since the last study visit, did the subject have a cardiology/HF procedure cancelled/rescheduled due to COVID-19 pandemic? [x]   No   []  Yes, how many? ___________   []  Unknown  SECTION F:  Effects of COVID Pandemic on Subject's Medications  Since the last visit, did any of their heart failure medications  change or stop, even for a short time?   If yes, enter change into medication eCRF [x]   No   []  Yes, how many? ___________   []  Unknown    If yes, why:   []  Instructed by Doctor []  Self-discontinued []  Unknown []  Other: ________________  Since the last visit, was the subject prescribed any medications for COVID-19? [x]   No   []  Yes   []  Unknown    If yes, what medications (generic name):  SECTION G:  Effects of COVID Pandemic on Subject's Lifestyle  How has the subject's activity/exercise level changed due to COVID-19 pandemic? [x]   No change   []  More activity/exercise   []  Less activity/exercise  How has the subject's smoking habits changed due to COVID-19? [x]   Does not smoke   []  No change   []  Smoke more   []  Smoke less  How has the subject's alcohol drinking habits changed due to COVID-19? []   Does not drink   [x]  No change   []  Drink more   []  Drink less  Section H:  Signature    Person completing form (Print Name): ____Marilyn , RN__    Signature: __Marilyn , RN__________ Date: ____7-13-2021___________________                Section A:  Administrative Section  Subject ID: _[id number]__ - _009_ - 015 Subject Initials: __B_ _W_ _E  Visit Interval:    []   Screening/Baseline    []   Activation   [x]   0.5 Month       []   1 Month     []   2 Month     []   3 Month       []   6 Month     []   12 Month         []   Unscheduled, reason for visit: _________________________________________  Section B:  Physical Assessment   Date: _13_/_JUL_/_2021_     (DD / MMM / )   Weight: _102.6__  []  kg [x]  pounds  Blood Pressure: _109_ / _70__mmHg Heart Rate: _70___ bpm  Section B:  Device Information   Battery Battery Voltage: 3.08 V   Battery Life: 128.3 Months   RRT Date: 22/MAR/2032 (DD/MMM/YYYY)  Lead Impeadance Right Lead _974______ Ohms    []   Low    []   High   Left Lead _________ Ohms    []   Low    [x]   High  Programmed Settings Pathway Pulse Width  Amplitude Frequency   []   Left 125 1.8 40   [x]   Right     Section C:  Programming Information (12 Month and Unscheduled - not required)   []  Programming Not Done   Date: _13__/_JUL___/_2021___     (DD / MMM / ) Device information, therapy schedule and programmed settings can be found on the session summary report  Site Clinician Present: ___Marilyn_Kay McChesney__________  [employer] Employee helping with programming: ______Kyle Wolf_______________ []   N/A  Location of [employer] person: [x]   []  Onsite Remote  Subject Experience   1. Did the subject experience transient bradycardia or hypotension during device testing? []   [x]  Yes No   2. Did the subject experience transient electrical stimulation of non-vascular tissues? [x]  []  Yes No   3. If yes to either question above, was intervention beyond reprogramming needed or was it associated with an additional untoward event? []   [x]  Yes No   4a. Is there any indication that there has been unacceptable device interaction between the [employer] device and other implanted electrical stimulators or sensors device? []   [x]  []  Yes No N/A (no other device)   4b. 4b. If "Yes" to question 4a, please describe the device interaction and corrective action taken:                  Section D:  Arrythmia Interventions  Has the subject received a cardiac ablation since the last study visit? []    Yes  Date of last procedure: ____/____/_____ (DD/MMM/YYYY)   [x]  No   []  Unknown   If yes, what type and # of ablations Type []  Atrial     []  Ventricular    # of procedures: ____________   Was this pre-planned prior to [employer] implant? []  Yes    []  No (complete/update AE form)  Has the subject received a cardioversion since the last study visit? []  Yes  Date of last procedure: ____/____/_____ (DD/MMM/YYYY)   [x]  No   []  Unknown   If yes, what type and # of cardioversions? Type []  Medications     []  Electrical Shock    # of procedures: _____________   Was this  pre-planned prior to [employer] implant? []  Yes    []  No (complete/update AE form)  Section E:  Adverse Events  Have any new adverse events or updates to existing events occurred since last visit? []  Yes (complete/update AE form)   [x]  No  Section F:  Medication Changes   Have there been any changes to the subject's home use medications for Arrhythmia, Antiplatelet/Anticoagulation and Heart failure medications since the last visit? []  Yes (update Med form)   [x]  No  Section G:  Comments                   Section H:  Signature   , RN  Person completing form (Print Name): _______________________________________________________    Signature: ___Marilyn , RN ________ Date: _07-13-2021_____            Current Outpatient Medications:  .  albuterol (ACCUNEB) 1.25 MG/3ML nebulizer solution, Take 1.25 mg by nebulization every 6 (six) hours as needed for wheezing. , Disp: , Rfl:  .  amiodarone (PACERONE) 200 MG tablet, Take 1 tablet (200 mg total) by mouth daily., Disp: 90 tablet,  Rfl: 1 .  clonazePAM (KLONOPIN) 0.5 MG tablet, Take 0.5 mg by mouth 3 (three) times daily. , Disp: , Rfl:  .  COLCRYS 0.6 MG tablet, Take 0.6 mg by mouth 2 (two) times daily as needed (for gout flare up). , Disp: , Rfl:  .  dapagliflozin propanediol (FARXIGA) 10 MG TABS tablet, Take 1 tablet (10 mg total) by mouth daily before breakfast., Disp: 30 tablet, Rfl: 6 .  digoxin (LANOXIN) 0.125 MG tablet, Take 1 tablet (0.125 mg total) by mouth daily. Telehealth visit 12/2019 with Dr. [name]., Disp: 90 tablet, Rfl: 1 .  furosemide (LASIX) 40 MG tablet, Take 40 mg by mouth daily as needed for fluid. , Disp: , Rfl:  .  mirtazapine (REMERON) 15 MG tablet, Take 15 mg by mouth at bedtime., Disp: , Rfl:  .  nystatin (MYCOSTATIN) 100000 UNIT/ML suspension, Take 5 mLs by mouth daily as needed (thrush). , Disp: , Rfl:  .  pramipexole (MIRAPEX) 0.125 MG tablet, Take 0.125 mg by mouth 3 (three)  times daily as needed (restless leg). , Disp: , Rfl:  .  rOPINIRole (REQUIP) 1 MG tablet, Take 1 mg by mouth 3 (three) times daily., Disp: , Rfl:  .  simvastatin (ZOCOR) 5 MG tablet, Take 5 mg by mouth daily., Disp: , Rfl:  .  spironolactone (ALDACTONE) 25 MG tablet, Take 1 tablet (25 mg total) by mouth daily. *Patient is overdue for an appointment and needs to call and schedule for further refills* (Patient taking differently: Take 25 mg by mouth daily. ), Disp: 15 tablet, Rfl: 0 .  warfarin (COUMADIN) 2 MG tablet, Take 2 mg by mouth daily. , Disp: , Rfl:

## 2020-06-18 NOTE — Progress Notes (Signed)
EPIC Encounter for ICM Monitoring  Patient Name: Chad Christensen is a 54 y.o. male Date: 06/18/2020 Primary Care Physican: Daphine Deutscher Primary Cardiologist:[place] Electrophysiologist: Allred 6/4/2021Office Weight: 107lbs.  Attempted call to patient and unable to reach.   Transmission reviewed.   CorvueThoracic impedancenormal but was suggesting possible fluid accumulation from 7/3-7/10. Pt had Barostim implant using Batwire on 06/08/2020.  Prescribed:Furosemide 40 mg 1 tablet as needed.  Labs: 06/08/2020 Creatinine 0.95, BUN 12, Potassium 4.6, Sodium 134, GFR >60 06/07/2020 Creatinine 1.02, BUN 11, Potassium 4.0, Sodium 135, GFR >60  05/10/2020 Creatinine 0.97, BUN 15, Potassium 4.3, Sodium 139, GFR >60 A complete set of results can be found in Results Review.  Recommendations:Unable to reach.    Follow-up plan: ICM clinic phone appointment on8/16/2021. 91 day device clinic remote transmission9/06/2020.   EP/Cardiology Office Visits: 06/21/2020 with Dr. Shirlee Latch.    Copy of ICM check sent to Dr. [name].   3 month ICM trend: 06/17/2020    1 Year ICM trend:       Karie Soda, RN 06/18/2020 9:07 AM

## 2020-06-21 ENCOUNTER — Ambulatory Visit (HOSPITAL_COMMUNITY)
Admission: RE | Admit: 2020-06-21 | Discharge: 2020-06-21 | Disposition: A | Discharge status: Home / Self Care | Payer: Medicare Other | Source: Ambulatory Visit | Attending: Cardiology | Admitting: Cardiology

## 2020-06-21 ENCOUNTER — Other Ambulatory Visit: Payer: Self-pay

## 2020-06-21 ENCOUNTER — Encounter (HOSPITAL_COMMUNITY): Payer: Self-pay | Admitting: Cardiology

## 2020-06-21 VITALS — BP 110/72 | HR 97 | Wt 100.0 lb

## 2020-06-21 DIAGNOSIS — Z8249 Family history of ischemic heart disease and other diseases of the circulatory system: Secondary | ICD-10-CM | POA: Diagnosis not present

## 2020-06-21 DIAGNOSIS — J449 Chronic obstructive pulmonary disease, unspecified: Secondary | ICD-10-CM | POA: Insufficient documentation

## 2020-06-21 DIAGNOSIS — Z79899 Other long term (current) drug therapy: Secondary | ICD-10-CM | POA: Insufficient documentation

## 2020-06-21 DIAGNOSIS — Z86010 Personal history of colonic polyps: Secondary | ICD-10-CM | POA: Diagnosis not present

## 2020-06-21 DIAGNOSIS — I428 Other cardiomyopathies: Secondary | ICD-10-CM | POA: Insufficient documentation

## 2020-06-21 DIAGNOSIS — I110 Hypertensive heart disease with heart failure: Secondary | ICD-10-CM | POA: Diagnosis not present

## 2020-06-21 DIAGNOSIS — I5022 Chronic systolic (congestive) heart failure: Secondary | ICD-10-CM | POA: Diagnosis not present

## 2020-06-21 DIAGNOSIS — R64 Cachexia: Secondary | ICD-10-CM | POA: Insufficient documentation

## 2020-06-21 DIAGNOSIS — Z87891 Personal history of nicotine dependence: Secondary | ICD-10-CM | POA: Diagnosis not present

## 2020-06-21 DIAGNOSIS — Z8719 Personal history of other diseases of the digestive system: Secondary | ICD-10-CM | POA: Diagnosis not present

## 2020-06-21 DIAGNOSIS — Z7901 Long term (current) use of anticoagulants: Secondary | ICD-10-CM | POA: Diagnosis not present

## 2020-06-21 DIAGNOSIS — Z7984 Long term (current) use of oral hypoglycemic drugs: Secondary | ICD-10-CM | POA: Diagnosis not present

## 2020-06-21 DIAGNOSIS — Z8673 Personal history of transient ischemic attack (TIA), and cerebral infarction without residual deficits: Secondary | ICD-10-CM | POA: Insufficient documentation

## 2020-06-21 DIAGNOSIS — I480 Paroxysmal atrial fibrillation: Secondary | ICD-10-CM | POA: Diagnosis not present

## 2020-06-21 MED ORDER — LOSARTAN POTASSIUM 25 MG PO TABS
12.5000 mg | ORAL_TABLET | Freq: Every day | ORAL | 3 refills | Status: DC
Start: 2020-06-21 — End: 2020-09-11

## 2020-06-21 NOTE — Patient Instructions (Signed)
START Losartan 12.5 mg, one half tab daily at bedtime Be SURE to use Ensure twice a day   Labs needed in 10 14 days, please take order to your primary care provider.  Your physician recommends that you schedule a follow-up appointment in: 1 month with Dr Shirlee Latch  Do the following things EVERYDAY: 1) Weigh yourself in the morning before breakfast. Write it down and keep it in a log. 2) Take your medicines as prescribed 3) Eat low salt foods--Limit salt (sodium) to 2000 mg per day.  4) Stay as active as you can everyday 5) Limit all fluids for the day to less than 2 liters

## 2020-06-23 NOTE — Progress Notes (Signed)
PCP: Betti Cruz, PA-C EP: Dr. [name] Cardiology: Dr. Shirlee Latch  54 y.o. with history of nonischemic dilated cardiomyopathy and paroxysmal atrial fibrillation presents for followup of CHF.  Patient lives in [place], [place].  He gets sporadic cardiology care as it is hard for him to get a ride to [place] and he does not want to see any of the cardiologists in [place]. He is followed by Dr. [name] for his [facility] ICD.  I saw him initially in 3/19, when he was referred by Dr. [name] for left/right heart cath.  This showed no coronary disease and low but not markedly low cardiac output (CI 2.22).  Echo in 3/19 showed a severely dilated LV with EF 20%.    Echo in 6/21 showed EF <20%, severe LV dilation, normal RV size with mildly decreased systolic function, IVC normal.  RHC was done in 6/21, showing low filling pressures and CI 2.38 by Fick/2.68 by Thermo.  CPX in 6/21 showed both severe deconditioning and severe HF limitation.  In 7/21, he had barostimulator implantation.   He returns for followup of CHF. He walks with a cane chronically due to arthritis (neck and back pain, has had back surgery) and poor balance with prior CVA.  He has quit smoking.  Since barostimulator was placed, he seems to feel better.  He is able to walk without walker now and feels like his cognition is better.  Still short of breath carrying even a light load.  Short of breath walking about 100 feet.  No lightheadedness, no chest pain, no orthopnea/PND.   ECG (personally reviewed): NSR, LVH  Labs (1/20): digoxin 0.6, K 4.4, creatinine 0.76 Labs (6/20): digoxin 0.6, K 4.9, creatinine 1.05 Labs (6/21): LFTs normal Labs (7/21): K 4.6, creatinine 0.95  PMH: 1. H/o VT 2. H/o GI bleeding 3. Colon polyps 4. Remote substance abuse. 5. HTN 6. Gout 7. Atrial fibrillation: Paroxysmal.  8. ?COPD: Smokes cigars.  9. Chronic systolic CHF: Nonischemic cardiomyopathy.  St Jude ICD.  - Echo (3/19): EF 20%, severe LV dilation,  diffuse hypokinesis, mild-moderate MR, low normal RV function.  - LHC/RHC (3/19): No CAD; mean RA 2, PA 24/8, mean PCWP 13, CI 2.2.  - Echo (6/21): EF <20%, severe LV dilation, normal RV size with mildly decreased systolic function, IVC normal.  - RHC (6/21): mean RA 1, PA 16/3, mean PCWP 3, CI 2.38 Fick/2.68 thermo.   - CPX (6/21): peak VO2 10.7, RER 0.85 (submaximal), VE/VCO2 slope 80 => severe deconditioning and severe HF limitation.  PFTs were restrictive.  - Barostimulator implantation (7/21).  10. Prior CVA: Likely related to atrial fibrillation.   10. Back surgery.   Social History   Socioeconomic History  . Marital status: Single    Spouse name: Not on file  . Number of children: Not on file  . Years of education: Not on file  . Highest education level: Not on file  Occupational History  . Occupation: Disability  Tobacco Use  . Smoking status: Former Smoker    Packs/day: 0.30    Years: 25.00    Pack years: 7.50    Types: Cigars    Start date: 09/13/1982    Quit date: 04/2020    Years since quitting: 0.2  . Smokeless tobacco: Never Used  Vaping Use  . Vaping Use: Never used  Substance and Sexual Activity  . Alcohol use: Not Currently    Alcohol/week: 2.0 - 3.0 standard drinks    Types: 2 - 3 Cans  of beer per week  . 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 Sexual activity: Not Currently  Other Topics Concern  . Not on file  Social History Narrative   Lives in [place], [place].   Went to [employer] for 2.5 years.   Social Determinants of Health   Financial Resource Strain:   . Difficulty of Paying Living Expenses:   Food Insecurity:   . Worried About Programme researcher, broadcasting/film/video in the Last Year:   . Barista in the Last Year:   Transportation Needs:   . Freight forwarder (Medical):   Marland Kitchen Lack of Transportation (Non-Medical):   Physical Activity:   . Days of Exercise per Week:   . Minutes of Exercise per Session:    Stress:   . Feeling of Stress :   Social Connections:   . Frequency of Communication with Friends and Family:   . Frequency of Social Gatherings with Friends and Family:   . Attends Religious Services:   . Active Member of Clubs or Organizations:   . Attends Banker Meetings:   Marland Kitchen Marital Status:   Intimate Partner Violence:   . Fear of Current or Ex-Partner:   . Emotionally Abused:   Marland Kitchen Physically Abused:   . Sexually Abused:    Family History  Problem Relation Age of Onset  . Cardiomyopathy Brother        Dilated   ROS: All systems reviewed and negative except as per HPI.   Current Outpatient Medications  Medication Sig Dispense Refill  . amiodarone (PACERONE) 200 MG tablet Take 1 tablet (200 mg total) by mouth daily. 90 tablet 1  . clonazePAM (KLONOPIN) 0.5 MG tablet Take 0.5 mg by mouth 3 (three) times daily.     Marland Kitchen COLCRYS 0.6 MG tablet Take 0.6 mg by mouth 2 (two) times daily as needed (for gout flare up).     . dapagliflozin propanediol (FARXIGA) 10 MG TABS tablet Take 1 tablet (10 mg total) by mouth daily before breakfast. 30 tablet 6  . digoxin (LANOXIN) 0.125 MG tablet Take 1 tablet (0.125 mg total) by mouth daily. Telehealth visit 12/2019 with Dr. [name]. 90 tablet 1  . furosemide (LASIX) 40 MG tablet Take 40 mg by mouth daily as needed for fluid.     . mirtazapine (REMERON) 15 MG tablet Take 15 mg by mouth at bedtime.    Marland Kitchen nystatin (MYCOSTATIN) 100000 UNIT/ML suspension Take 5 mLs by mouth daily as needed (thrush).     . pramipexole (MIRAPEX) 0.125 MG tablet Take 0.125 mg by mouth 3 (three) times daily as needed (restless leg).     Marland Kitchen rOPINIRole (REQUIP) 1 MG tablet Take 1 mg by mouth 3 (three) times daily.    . simvastatin (ZOCOR) 5 MG tablet Take 5 mg by mouth daily.    Marland Kitchen spironolactone (ALDACTONE) 25 MG tablet Take 1 tablet (25 mg total) by mouth daily. *Patient is overdue for an appointment and needs to call and schedule for further refills* (Patient  taking differently: Take 25 mg by mouth daily. ) 15 tablet 0  . warfarin (COUMADIN) 2 MG tablet Take 2 mg by mouth daily.     Marland Kitchen albuterol (ACCUNEB) 1.25 MG/3ML nebulizer solution Take 1.25 mg by nebulization every 6 (six) hours as needed for wheezing.  (Patient not taking: Reported on 06/21/2020)    . losartan (COZAAR) 25 MG tablet Take  0.5 tablets (12.5 mg total) by mouth at bedtime. 45 tablet 3   No current facility-administered medications for this encounter.   BP 110/72   Pulse 97   Wt 45.4 kg (100 lb)   SpO2 97%   BMI 13.56 kg/m  General: NAD, cachectic Neck: No JVD, no thyromegaly or thyroid nodule.  Lungs: Clear to auscultation bilaterally with normal respiratory effort. CV: Nondisplaced PMI.  Heart regular S1/S2, no S3/S4, no murmur.  No peripheral edema.  No carotid bruit.  Normal pedal pulses.  Abdomen: Soft, nontender, no hepatosplenomegaly, no distention.  Skin: Intact without lesions or rashes.  Neurologic: Alert and oriented x 3.  Psych: Normal affect. Extremities: No clubbing or cyanosis.  HEENT: Normal.    Assessment/Plan: 1. Chronic systolic CHF: Echo 6/21 with EF <20%, severe LV dilation, normal RV size with mildly decreased systolic function, IVC normal.  LHC/RHC in 3/20 with no coronary disease, relatively preserved cardiac output.  Nonischemic cardiomyopathy with St Jude ICD.  Probably not CRT candidate, has IVCD but not classic LBBB. RHC in 6/21 showed low filling pressures and preserved cardiac output, but CPX (submaximal) in 6/21 showed severe limitation due to HF as well as severe limitation due to deconditioning. He is not volume overloaded on exam.  NYHA class III symptoms, better since barostimulator implanted.  - He takes Lasix prn.  - Continue digoxin 0.125 daily, check level with next labs.  - Continue spironolactone 25 mg daily.  - Continue dapagliflozin 10 mg daily.  - Will try him again on losartan 12.5 mg qhs. BMET 10 days.  - Markedly deconditioned  and cachectic, needs to be in better physical shape prior to any consideration of advanced therapies.  Luckily, he is feeling better with barostimulation and cardiac output was not low on recent RHC.  He needs to drink Ensure at least twice a day.  I would like him to do cardiac rehab but he does not have transportation.  2. Smoking/?COPD: Patient quit smoking about 3 months ago.  Restrictive PFTs with 6/21 CPX.  3. H/o VT: He is on amiodarone.  - Check LFTs, TSH with next labs.  He should get a regular eye exam.  - Can decrease amiodarone to 100 mg daily.  4. Atrial fibrillation: Paroxysmal.  - Continue warfarin.  Followup with me in 1 month.   Marca Ancona 06/23/2020

## 2020-06-24 ENCOUNTER — Encounter (HOSPITAL_COMMUNITY): Payer: Medicare Other

## 2020-07-01 ENCOUNTER — Other Ambulatory Visit: Payer: Self-pay

## 2020-07-01 ENCOUNTER — Other Ambulatory Visit (HOSPITAL_COMMUNITY): Payer: Self-pay | Admitting: *Deleted

## 2020-07-01 ENCOUNTER — Ambulatory Visit (HOSPITAL_COMMUNITY)
Admission: RE | Admit: 2020-07-01 | Discharge: 2020-07-01 | Disposition: A | Discharge status: Home / Self Care | Payer: Medicare Other | Source: Ambulatory Visit | Attending: Cardiology | Admitting: Cardiology

## 2020-07-01 ENCOUNTER — Encounter: Payer: Medicare Other | Admitting: *Deleted

## 2020-07-01 ENCOUNTER — Ambulatory Visit (INDEPENDENT_AMBULATORY_CARE_PROVIDER_SITE_OTHER): Payer: Medicare Other | Admitting: Otolaryngology

## 2020-07-01 VITALS — Temp 97.5°F

## 2020-07-01 VITALS — BP 104/75 | HR 72 | Wt 104.6 lb

## 2020-07-01 DIAGNOSIS — Z006 Encounter for examination for normal comparison and control in clinical research program: Secondary | ICD-10-CM

## 2020-07-01 DIAGNOSIS — I5022 Chronic systolic (congestive) heart failure: Secondary | ICD-10-CM | POA: Insufficient documentation

## 2020-07-01 LAB — BASIC METABOLIC PANEL
Anion gap: 7 (ref 5–15)
BUN: 17 mg/dL (ref 6–20)
CO2: 27 mmol/L (ref 22–32)
Calcium: 9.3 mg/dL (ref 8.9–10.3)
Chloride: 102 mmol/L (ref 98–111)
Creatinine, Ser: 1.04 mg/dL (ref 0.61–1.24)
GFR calc Af Amer: >60 mL/min (ref >60)
GFR calc non Af Amer: >60 mL/min (ref >60)
Glucose, Bld: 105 mg/dL — ABNORMAL HIGH (ref 70–99)
Potassium: 4.3 mmol/L (ref 3.5–5.1)
Sodium: 136 mmol/L (ref 135–145)

## 2020-07-01 NOTE — Progress Notes (Addendum)
Section A:  Administrative Section  Subject ID: _[id number]_ - _009____ - 015 Subject Initials: ___BWE ___ ___ Visit Interval:  (* = as required) []   Baseline     []  Implant/Pre D/C  Date of Report:   __26___/__JUL_____/__2021_____       (DD / MMM / )  [x]   1 Month     []  3 Month*  Name of ENT __Christopher MD_  []   6 Month*     []  12 Month*     []  Unscheduled*  Directions:  Baseline: complete sections B-E only   All other visits, complete all sections below (B-G)   Use comments box to document any abnormalities or changes from Baseline  Was the [employer] device turned off? [x]  Yes   []  No, specify reason why not: __________________________________  Was a laryngoscope used? []  Yes   [x]  No  Section B:  Dysphonia   Global Rating of Dysphonia: (Absence of recent upper respiratory infection or recent extensive voice abuse)   [x]   Normal (or clear)   []   Mild   []   Mild to Moderate   []   Moderate   []   Moderate to Severe   []   Severe   []   Aphonic  Section C:  Tongue/Pharynx   Assessment Observation Severity (complete only if abnormal); defined as:    Mild Significant impairment of functioning; patient is unable to carry out usual activities.    Moderate Patient experiences sufficient discomfort to interfere with or reduce their usual level of activity.    Severe Clinically evident impairment of functioning; patient is unable to carry out usual activities.  Pharynx at Rest [x]   Symmetric []   Mild   []  Asymmetric []  Moderate     []  Severe  Pharynx with Phonation ("ah") [x]   Symmetric []   Mild   []  Asymmetric []  Moderate     []  Severe  Tongue Atrophy [x]   Not Present []   Mild   []  Present []  Moderate     []  Severe  Tongue Mobility [x]   Normal []   Mild   []  Reduced []  Moderate     []  Severe  Tongue Protrusion [x]   Midline []   Mild Side: ____________   []  Deviation []  Moderate      []  Severe   Sensation of the Pharynx [x]   Normal []   Mild   []  Decreased []  Moderate     []   Severe  Section D:  House-Brackman Facial Paralysis Scale (Circle One)  Grade Impairment  I  [x]  Normal  II  []  Mild Dysfunction (slight weakness, normal symmetry at rest)  III  []  Moderate Dysfunction (obvious but not disfiguring weakness with synkinesis, normal symmetry at rest); Complete eye closure with maximal effort; Good forehead movement  IV  []  Moderately severe dysfunction (obvious and disfiguring asymmetry, significant synkinesis); Incomplete eye closure; moderate forehead movement  V  []  Severe dysfunction (barely perceptible motion  VI  []  Total paralysis (no movement)  Section E:  Baseline Cranial Nerve Damage  Does the subject have a presence of baseline cranial nerve dysfunction []  Yes, specify what dysfunction: _____________________________________   [x]  No  Section F:  ENT Notes (Pre-D/C and Follow-Up)  Were changes in Dysphonia from baseline or any abnormalities related to the [facility] Implant? []   Yes   [x]  No   []  Unknown   []  Not Applicable  Were changes in Tongue/Pharynx from baseline or any abnormalities related to the [facility] Implant? []   Yes   [x]   No   []  Unknown   []  Not Applicable  Were changes in House-Brackman Facial Paralysis Scale from baseline or any abnormalities related to the [facility] Implant? []   Yes   [x]  No   []  Unknown   []  Not Applicable  Were any of the above changes from baseline potentially related to the intubation for the procedure? []   Yes   [x]  No   []  Unknown   []  Not Applicable  Section G:  Cranial Nerves  Has there been new cranial nerve dysfunction since Baseline?   Marginal mandibular branch of cranial nerve VII (Facial Nerve) []  Yes, specify what changed: ___________________________________   [x]  No  Cranial nerve IX (Glossopharyngeal Nerve) []  Yes, specify what changed: ___________________________________   [x]  No  Cranial nerve X  (Vagus Nerve) []  Yes, specify what changed: ___________________________________   [x]  No   Cranial nerve XI  (Accessory Nerve) []  Yes, specify what changed: ___________________________________   [x]  No  Cranial nerve XII  (Hypoglossal Nerve) []  Yes, specify what changed: ___________________________________   [x]  No  Section H:  Comments/Notes/Describe any abnormalities/deficiencies (as well if post-operative change)

## 2020-07-01 NOTE — Research (Addendum)
17M [employer] follow up. Patient able to walk a little bit without walker maybe 3/4 feet. Patient weight is 104.6lbs today he has been drinking his ensure daily 3/4 a day. So we are excited about this weight gain. He is feeling well, no complaints chest pains, no shortness of breath, no swelling in abdomen or ankles.  Patient is excited and feeling well.                Section A:  Administrative Section  Subject ID: _[id number]__ - _009__ - 015 Subject Initials: _B__ _W__ _E__  Visit Interval:    []   Screening/Baseline    []   Activation   []   0.5 Month       [x]   1 Month     []   2 Month     []   3 Month       []   6 Month     []   12 Month         []   Unscheduled, reason for visit: _________________________________________  Section B:  Physical Assessment   Date: _26_/_JUL_/_2021_     (DD / MMM / YYYY)   Weight: _104.6__  []  kg [x]  pounds  Blood Pressure: _104_ / _75_mmHg Heart Rate: _72_ bpm  Section B:  Device Information   Battery Battery Voltage: 3.05 V   Battery Life:  error KL :) 04/11/2021 102 Months   RRT Date: 20/Jan/2030 (DD/MMM/YYYY)  Lead Impeadance Right Lead _1260________ Ohms    []   Low    []   High   Left Lead _________ Ohms    []   Low    [x]   High  Programmed Settings Pathway Pulse Width Amplitude Frequency   []   Left 125 2.6 40   [x]   Right     Section C:  Programming Information (12 Month and Unscheduled - not required)   []  Programming Not Done   Date: _26_/_JUL_/_2021_     (DD / MMM / ) Device information, therapy schedule and programmed settings can be found on the session summary report  Site Clinician Present: __Kimberly :) __  [employer] Employee helping with programming: _Kyle Wolf__ []   N/A  Location of [employer] person: [x]   []  Onsite Remote  Subject Experience   1. Did the subject experience transient bradycardia or hypotension during device testing? []   [x]  Yes No   2. Did the subject experience transient electrical stimulation of non-vascular  tissues? []  [x]  Yes No   3. If yes to either question above, was intervention beyond reprogramming needed or was it associated with an additional untoward event? []   [x]  Yes No   4a. Is there any indication that there has been unacceptable device interaction between the [employer] device and other implanted electrical stimulators or sensors device? []   [x]  []  Yes No N/A (no other device)   4b. 4b. If "Yes" to question 4a, please describe the device interaction and corrective action taken:                  Section D:  Arrythmia Interventions  Has the subject received a cardiac ablation since the last study visit? []    Yes  Date of last procedure: ____/____/_____ (DD/MMM/YYYY)   [x]  No   []  Unknown   If yes, what type and # of ablations Type []  Atrial     []  Ventricular    # of procedures: ____________   Was this pre-planned prior to [employer] implant? []  Yes    [x]  No (  complete/update AE form)  Has the subject received a cardioversion since the last study visit? []  Yes  Date of last procedure: ____/____/_____ (DD/MMM/YYYY)   [x]  No   []  Unknown   If yes, what type and # of cardioversions? Type []  Medications     []  Electrical Shock    # of procedures: _____________   Was this pre-planned prior to CV[employer] implant? []  Yes    [x]  No (complete/update AE form)  Section E:  Adverse Events  Have any new adverse events or updates to existing events occurred since last visit? []  Yes (complete/update AE form)   [x]  No  Section F:  Medication Changes   Have there been any changes to the subject's home use medications for Arrhythmia, Antiplatelet/Anticoagulation and Heart failure medications since the last visit? [x]  Yes (update Med form)   []  No  Section G:  Comments   n/a                Section H:  Signature    Person completing form (Print Name): ___Kimberly Red Mandt :) ______   Signature: ____Kimberly :) ____ Date: _07/28/2021______    Patient ID: _[id number]_ - _009____ -  015 Patient Initials: _B_ _W_ _E_  Visit Interval:    []   Screening/Baseline    []  Activation   []  0.5 Month       [x]  1 Month     []  2 Month     []  3 Month       []  6 Month     []  12 Month         []  Unscheduled, reason for visit: _________________________________________  SECTION B:  COVID-19 Like Illness Symptoms   Has the subject experienced any cold, flu or COVID-19 symptoms in the past 30 days? [x]   No  (skip to section C)     []  Yes, date of onset:  _____/_____ MMM/YYYY    If yes, check all symptoms that apply:   []  Fevers or chills   []   New or Worsening Cough  []  Productive  []  Dry     If yes to cough, indicate severity  []  Constant  []  Occasional, several per hour   []  New or worsened shortness of breath   []  Diarrhea   []  Altered or reduced sense of smell or taste   []  Muscle aches/Severe Fatigue   []  Chest pain or tightness   []  Sore throat   []  Nausea or vomiting  SECTION C:  COVID-19 Like Illness Testing   Has the patient been tested for COVID-19? [x]  No     []  Yes, date of test:  _____/_____ MMM/YYYY    If yes, test results:   []  Positive []  Negative []  Unknown  Has the patient been tested for COVID-19 Antibodies? [x]  No     []  Yes, date of test:  _____/_____ MMM/YYYY    If yes, test results:   []  Positive []  Negative []  Unknown  Has the patient been vaccinated for COVID-19? [x]  No     []  Yes, date of test:  _____/____ MMM/YYYY  Has the patient been tested for Influenza ("flu")? [x]  No     []  Yes, date of test:  _____/_____ MMM/YYYY    If yes, test results:   []  Positive []  Negative []  Unknown  Has the patient been vaccinated for Influenza ("flu")? [x]  No     []  Yes, date of test:  _____/_____ MMM/YYYY  SECTION D:  COVID-19 Like Illness Exposure  Has the subject been told that they might have had COVID-19/have symptoms suggestive of COVID-19? [x]    No   []  Yes   []   Unknown  Has the subject been exposed to anyone with known or suspected COVID-19? [x]     No   []  Yes   []   Unknown  Has the subject been told that they might have had the "flu" or influenza? [x]    No   []  Yes   []   Unknown  SECTION E:  Effects of COVID Pandemic on Subject's Healthcare Interactions  Since the last study visit, did the subject feel the need to go to an emergency department or hospital for their heart failure but decided not to because of concerns about COVID-19? [x]   No   []  Yes   []  Unknown    If yes, how did they seek care (check all that apply):   []  Telemedicine visit []  In-person []  Clinic []  Urgent Care   []  Subject did not change hospital/ER use due to COVID-19 []  Other, specify: ________________________  Since the last study visit, did the subject have a cardiology/HF related appointment cancelled/rescheduled due to COVID-19 pandemic? [x]   No   []  Yes, how many? ___________   []  Unknown  Since the last study visit, did the subject have any cardiology/HF related telemedicine visit due to COVID-19 pandemic? [x]   No   []  Yes, how many? ___________   []  Unknown  Since the last study visit, did the subject have a cardiology/HF procedure cancelled/rescheduled due to COVID-19 pandemic? [x]   No   []  Yes, how many? ___________   []  Unknown  SECTION F:  Effects of COVID Pandemic on Subject's Medications  Since the last visit, did any of their heart failure medications change or stop, even for a short time?   If yes, enter change into medication eCRF [x]   No   []  Yes, how many? ___________   []  Unknown    If yes, why:   []  Instructed by Doctor []  Self-discontinued []  Unknown []  Other: ________________  Since the last visit, was the subject prescribed any medications for COVID-19? [x]   No   []  Yes   []  Unknown    If yes, what medications (generic name):  SECTION G:  Effects of COVID Pandemic on Subject's Lifestyle  How has the subject's activity/exercise level changed due to COVID-19 pandemic? [x]   No change   []  More activity/exercise   []  Less  activity/exercise  How has the subject's smoking habits changed due to COVID-19? [x]   Does not smoke   []  No change   []  Smoke more   []  Smoke less  How has the subject's alcohol drinking habits changed due to COVID-19? []   Does not drink   []  No change   []  Drink more   [x]  Drink less  Section H:  Signature    Person completing form (Print Name): __Kimberly :) _______   Signature: :) ______ Date: ____07/28/2021__________     Section A:  Administrative Section  Subject ID: _[id number]_ - _009_ - 015 Subject Initials: _B_ _W_ _E_  Visit Interval:  (* = as required)   []  Screening/Baseline    [x]  1 Month   []  3 Month*       []  6 Month*     []  12 Month*         []  Unscheduled*, reason for visit: _________________________________________  Section D:  Voice Handicap Index - 10   Voice Handicap  Index Date:    _26_/_JUL_/_2021_  (DD / MMM / Annamarie Major)  Instructions: These are statements that many people have used to describe their voices and the effects of their voices on their lives.  Circle the response that indicates how frequently you have the same experience:   Never Almost Never Some-times Almost Always Always  1. My voice makes it difficult for people to hear me. 0 []  1 []  2 []  3 [x]  4 []   2. People have difficulty understanding me in a noisy room. 0 [x]  1 []  2 []  3 []  4 []   3. My voice difficulties restrict personal and social life. 0 []  1 []  2 [x]  3 []  4 []   4. I feel left out of conversations because of my voice. 0 []  1 [x]  2 []  3 []  4 []   5. My voice problem causes me to lose income. 0 [x]  1 []  2 []  3 []  4 []   6. I feel as though I have to strain to produce voice. 0 []  1 []  2 []  3 [x]  4 []   7. The clarity of my voice is unpredictable. 0 []  1 []  2 []  3 []  4 [x]   8. My voice problem upsets me. 0 []  1 []  2 []  3 []  4 [x]   9. My voice makes me feel handicapped. 0 []  1 []  2 []  3 []  4 [x]   10. People ask, "What's wrong with your voice?" 0 []  1 [x]  2 []  3 []  4  []     Section D:  Eating Assessment Tool - 10   Eating Assessment Index Date:    _26_/_JUL_/_2021_  (DD / MMM / YYYY)  To what extent are the following scenarios problematic for you: (Circle the appropriate response)   No Problem    Severe Problem  1. My swallowing problem has caused me to lose weight. 0 []  1 [x]  2 []  3 []  4 []   2. My swallowing problem interferes with my ability to go out for meals. 0 []  1 []  2 []  3 [x]  4 []   3. Swallowing liquids takes extra effort. 0 []  1 []  2 []  3 [x]  4 []   4. Swallowing solids takes extra effort. 0 []  1 []  2 []  3 [x]  4 []   5. Swallowing pills takes extra effort. 0 []  1 []  2 [x]  3 []  4 []   6. Swallowing is painful. 0 []  1 [x]  2 []  3 []  4 []   7. The pleasure of eating is affected by my swallowing. 0 []  1 []  2 []  3 [x]  4 []   8. When I swallow, food sticks in my throat. 0 []  1 []  2 []  3 []  4 [x]   9. I cough when I eat. 0 []  1 []  2 []  3 []  4 [x]   10. Swallowing is stressful. 0 []  1 []  2 [x]  3 []  4 []   Section E:  Signature Section   Person Administering Questionnaire Name: (person who instructed the subject on how to complete the form) Mercer Pod :)_ (Print)     Mercer Pod :) __ (Sign) __07/26/2021__ (Date)  Person Completing Questionnaire Name: (e.g. subject, person reading questionnaire, etc.) Mercer Pod :) __ (Print)    Mercer Pod :) __ (Sign) _07/26/2021__ (Date)

## 2020-07-03 ENCOUNTER — Encounter: Payer: Self-pay | Admitting: *Deleted

## 2020-07-03 DIAGNOSIS — Z006 Encounter for examination for normal comparison and control in clinical research program: Secondary | ICD-10-CM

## 2020-07-03 NOTE — Research (Signed)
error 

## 2020-07-15 ENCOUNTER — Encounter (HOSPITAL_COMMUNITY): Payer: Medicare Other | Admitting: Cardiology

## 2020-07-22 ENCOUNTER — Ambulatory Visit (INDEPENDENT_AMBULATORY_CARE_PROVIDER_SITE_OTHER): Payer: Medicare Other

## 2020-07-22 DIAGNOSIS — Z95810 Presence of automatic (implantable) cardiac defibrillator: Secondary | ICD-10-CM

## 2020-07-22 DIAGNOSIS — I5022 Chronic systolic (congestive) heart failure: Secondary | ICD-10-CM

## 2020-07-22 NOTE — Progress Notes (Signed)
EPIC Encounter for ICM Monitoring  Patient Name: Chad Christensen is a 54 y.o. male Date: 07/22/2020 Primary Care Physican: Daphine Deutscher Primary Cardiologist:McLean Electrophysiologist: Allred 6/4/2021Office Weight: 107lbs.  Spoke with patient and reports feeling well at this time.  Denies fluid symptoms.  He has increased fluid intake due to he is exercising on new stationary bike.  CorvueThoracic impedance suggesting possible fluid accumulation x 1 day. 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  Prescribed:Furosemide 40 mg 1 tablet as needed.  Labs: 06/08/2020 Creatinine 0.95, BUN 12, Potassium 4.6, Sodium 134, GFR >60 06/07/2020 Creatinine 1.02, BUN 11, Potassium 4.0, Sodium 135, GFR >60  05/10/2020 Creatinine0.97, BUN15, Potassium4.3, Sodium139, GFR>60 A complete set of results can be found in Results Review.  Recommendations: He will take 1/2 Furosemide tablet tomorrow. Recommendation to limit fluid intake to 64 oz daily.  Encouraged to call if experiencing any fluid symptoms.   Follow-up plan: ICM clinic phone appointment on9/20/2021. 91 day device clinic remote transmission9/06/2020.   EP/Cardiology Office Visits: 06/21/2020 with Dr. Shirlee Latch.    Copy of ICM check sent to Dr. [name]   3 month ICM trend: 07/22/2020    1 Year ICM trend:       Karie Soda, RN 07/22/2020 4:31 PM

## 2020-08-07 ENCOUNTER — Encounter: Payer: Medicare Other | Admitting: *Deleted

## 2020-08-07 VITALS — BP 88/65 | HR 64 | Resp 18 | Wt 99.0 lb

## 2020-08-07 DIAGNOSIS — Z006 Encounter for examination for normal comparison and control in clinical research program: Secondary | ICD-10-CM

## 2020-08-07 NOTE — Research (Signed)
Patient here today for 21M follow up Patient has lost weight and just don't feel up to par.  Spoke with Dr Shirlee Latch about patient to see if he wanted to change anything, no changes at this time except to take his meds at night instead of first thing in the am. Will see    Current Outpatient Medications:  .  albuterol (ACCUNEB) 1.25 MG/3ML nebulizer solution, Take 1.25 mg by nebulization every 6 (six) hours as needed for wheezing. , Disp: , Rfl:  .  amiodarone (PACERONE) 200 MG tablet, Take 1 tablet (200 mg total) by mouth daily., Disp: 90 tablet, Rfl: 1 .  clonazePAM (KLONOPIN) 0.5 MG tablet, Take 0.5 mg by mouth 3 (three) times daily. , Disp: , Rfl:  .  COLCRYS 0.6 MG tablet, Take 0.6 mg by mouth 2 (two) times daily as needed (for gout flare up). , Disp: , Rfl:  .  dapagliflozin propanediol (FARXIGA) 10 MG TABS tablet, Take 1 tablet (10 mg total) by mouth daily before breakfast., Disp: 30 tablet, Rfl: 6 .  digoxin (LANOXIN) 0.125 MG tablet, Take 1 tablet (0.125 mg total) by mouth daily. Telehealth visit 12/2019 with Dr. [name]., Disp: 90 tablet, Rfl: 1 .  furosemide (LASIX) 40 MG tablet, Take 40 mg by mouth daily as needed for fluid. , Disp: , Rfl:  .  losartan (COZAAR) 25 MG tablet, Take 0.5 tablets (12.5 mg total) by mouth at bedtime., Disp: 45 tablet, Rfl: 3 .  mirtazapine (REMERON) 15 MG tablet, Take 15 mg by mouth at bedtime., Disp: , Rfl:  .  nystatin (MYCOSTATIN) 100000 UNIT/ML suspension, Take 5 mLs by mouth daily as needed (thrush). , Disp: , Rfl:  .  pramipexole (MIRAPEX) 0.125 MG tablet, Take 0.125 mg by mouth 3 (three) times daily as needed (restless leg). , Disp: , Rfl:  .  rOPINIRole (REQUIP) 1 MG tablet, Take 1 mg by mouth 3 (three) times daily., Disp: , Rfl:  .  simvastatin (ZOCOR) 5 MG tablet, Take 5 mg by mouth daily., Disp: , Rfl:  .  spironolactone (ALDACTONE) 25 MG tablet, Take 1 tablet (25 mg total) by mouth daily. *Patient is overdue for an appointment and needs to call and  schedule for further refills* (Patient taking differently: Take 25 mg by mouth daily. ), Disp: 15 tablet, Rfl: 0 .  warfarin (COUMADIN) 2 MG tablet, Take 2 mg by mouth daily. , Disp: , Rfl:                Section A:  Administrative Section  Subject ID: _[id number]__ - _[id number]_ - [id number] Subject Initials: _B_ _W_ _E_  Visit Interval:    []   Screening/Baseline    []   Activation   []   0.5 Month       []   1 Month     [x]   2 Month     []   3 Month       []   6 Month     []   12 Month         []   Unscheduled, reason for visit: _________________________________________  Section B:  Physical Assessment   Date: _01_/_SEP_/_2021_     (DD / MMM / )   Weight: __99__  []  kg [x]  pounds  Blood Pressure: _88_ / _65_mmHg Heart Rate: _64_ bpm  Section B:  Device Information   Battery Battery Voltage: 3.02 V   Battery Life: 91.8 Months   RRT Date: 01-Apr-2020 (DD/MMM/YYYY)  Lead Impeadance Right Lead __1305_______ Ohms    []   Low    []   High   Left Lead _________ Ohms    []   Low    [x]   High  Programmed Settings Pathway Pulse Width Amplitude Frequency   []   Left 125 3 40   [x]   Right     Section C:  Programming Information (12 Month and Unscheduled - not required)   []  Programming Not Done   Date: _01_/_SEP_/_2021_     (DD / MMM / ) Device information, therapy schedule and programmed settings can be found on the session summary report  Site Clinician Present: :)   __  [employer] Employee helping with programming: __Kyle Wolf ________ []   N/A  Location of [employer] person: [x]   []  Onsite Remote  Subject Experience   1. Did the subject experience transient bradycardia or hypotension during device testing? [x]   []  Yes No   2. Did the subject experience transient electrical stimulation of non-vascular tissues? []  [x]  Yes No   3. If yes to either question above, was intervention beyond reprogramming needed or was it associated with an additional untoward event? []   [x]  Yes No   4a. Is  there any indication that there has been unacceptable device interaction between the [employer] device and other implanted electrical stimulators or sensors device? []   [x]  []  Yes No N/A (no other device)   4b. 4b. If "Yes" to question 4a, please describe the device interaction and corrective action taken:                  Section D:  Arrythmia Interventions  Has the subject received a cardiac ablation since the last study visit? []    Yes  Date of last procedure: ____/____/_____ (DD/MMM/YYYY)   [x]  No   []  Unknown   If yes, what type and # of ablations Type []  Atrial     []  Ventricular    # of procedures: ____________   Was this pre-planned prior to [employer] implant? []  Yes    []  No (complete/update AE form)  Has the subject received a cardioversion since the last study visit? []  Yes  Date of last procedure: ____/____/_____ (DD/MMM/YYYY)   [x]  No   []  Unknown   If yes, what type and # of cardioversions? Type []  Medications     []  Electrical Shock    # of procedures: _____________   Was this pre-planned prior to [employer] implant? []  Yes    []  No (complete/update AE form)  Section E:  Adverse Events  Have any new adverse events or updates to existing events occurred since last visit? []  Yes (complete/update AE form)   [x]  No  Section F:  Medication Changes   Have there been any changes to the subject's home use medications for Arrhythmia, Antiplatelet/Anticoagulation and Heart failure medications since the last visit? []  Yes (update Med form)   [x]  No  Section G:  Comments                   Section H:  Signature    Person completing form (Print Name): ____Kimberly :) ______________    Signature: :) ____ Date: ___09/01/2021___     Patient ID: _[id number]_ - _[id number]_ - [id number] Patient Initials: _B_ _W_ _E_  Visit Interval:    []   Screening/Baseline    []  Activation   []  0.5 Month       []  1 Month     [x]  2 Month     []  3 Month       []   6 Month     []  12 Month          []  Unscheduled, reason for visit: _________________________________________  SECTION B:  COVID-19 Like Illness Symptoms   Has the subject experienced any cold, flu or COVID-19 symptoms in the past 30 days? [x]   No  (skip to section C)     []  Yes, date of onset:  _____/_____ MMM/YYYY    If yes, check all symptoms that apply:   []  Fevers or chills   []   New or Worsening Cough  []  Productive  []  Dry     If yes to cough, indicate severity  []  Constant  []  Occasional, several per hour   []  New or worsened shortness of breath   []  Diarrhea   []  Altered or reduced sense of smell or taste   []  Muscle aches/Severe Fatigue   []  Chest pain or tightness   []  Sore throat   []  Nausea or vomiting  SECTION C:  COVID-19 Like Illness Testing   Has the patient been tested for COVID-19? [x]  No     []  Yes, date of test:  _____/_____ MMM/YYYY    If yes, test results:   []  Positive []  Negative []  Unknown  Has the patient been tested for COVID-19 Antibodies? [x]  No     []  Yes, date of test:  _____/_____ MMM/YYYY    If yes, test results:   []  Positive []  Negative []  Unknown  Has the patient been vaccinated for COVID-19? [x]  No     []  Yes, date of test:  _____/_____ MMM/YYYY  Has the patient been tested for Influenza ("flu")? [x]  No     []  Yes, date of test:  _____/_____ MMM/YYYY    If yes, test results:   []  Positive []  Negative []  Unknown  Has the patient been vaccinated for Influenza ("flu")? [x]  No     []  Yes, date of test:  _____/_____ MMM/YYYY  SECTION D:  COVID-19 Like Illness Exposure  Has the subject been told that they might have had COVID-19/have symptoms suggestive of COVID-19? [x]    No   []  Yes   []   Unknown  Has the subject been exposed to anyone with known or suspected COVID-19? [x]    No   []  Yes   []   Unknown  Has the subject been told that they might have had the "flu" or influenza? [x]    No   []  Yes   []   Unknown  SECTION E:  Effects of COVID Pandemic on Subject's  Healthcare Interactions  Since the last study visit, did the subject feel the need to go to an emergency department or hospital for their heart failure but decided not to because of concerns about COVID-19? [x]   No   []  Yes   []  Unknown    If yes, how did they seek care (check all that apply):   []  Telemedicine visit []  In-person []  Clinic []  Urgent Care   []  Subject did not change hospital/ER use due to COVID-19 []  Other, specify: ________________________  Since the last study visit, did the subject have a cardiology/HF related appointment cancelled/rescheduled due to COVID-19 pandemic? [x]   No   []  Yes, how many? ___________   []  Unknown  Since the last study visit, did the subject have any cardiology/HF related telemedicine visit due to COVID-19 pandemic? [x]   No   []  Yes, how many? ___________   []  Unknown  Since the last study visit, did the subject have a  cardiology/HF procedure cancelled/rescheduled due to COVID-19 pandemic? [x]   No   []  Yes, how many? ___________   []  Unknown  SECTION F:  Effects of COVID Pandemic on Subject's Medications  Since the last visit, did any of their heart failure medications change or stop, even for a short time?   If yes, enter change into medication eCRF [x]   No   []  Yes, how many? ___________   []  Unknown    If yes, why:   []  Instructed by Doctor []  Self-discontinued []  Unknown []  Other: ________________  Since the last visit, was the subject prescribed any medications for COVID-19? [x]   No   []  Yes   []  Unknown    If yes, what medications (generic name):  SECTION G:  Effects of COVID Pandemic on Subject's Lifestyle  How has the subject's activity/exercise level changed due to COVID-19 pandemic? []   No change   []  More activity/exercise   [x]  Less activity/exercise  How has the subject's smoking habits changed due to COVID-19? [x]   Does not smoke   []  No change   []  Smoke more   []  Smoke less  How has the subject's alcohol drinking habits  changed due to COVID-19? []   Does not drink   [x]  No change   []  Drink more   []  Drink less  Section H:  Signature    Person completing form (Print Name): Mercer Pod :) __________________    Signature: Mercer Pod :) ______ Date: _09/01/2021__

## 2020-08-13 ENCOUNTER — Ambulatory Visit (INDEPENDENT_AMBULATORY_CARE_PROVIDER_SITE_OTHER): Payer: Medicare Other | Admitting: *Deleted

## 2020-08-13 DIAGNOSIS — I472 Ventricular tachycardia, unspecified: Secondary | ICD-10-CM

## 2020-08-13 LAB — CUP PACEART REMOTE DEVICE CHECK
Date Time Interrogation Session: 20210906182833
Implantable Lead Implant Date: 20110909
Implantable Lead Location: 753860
Implantable Lead Model: 7122
Implantable Pulse Generator Implant Date: 20110909
Pulse Gen Serial Number: 614505

## 2020-08-14 NOTE — Progress Notes (Signed)
Remote ICD transmission.   

## 2020-08-26 ENCOUNTER — Ambulatory Visit (INDEPENDENT_AMBULATORY_CARE_PROVIDER_SITE_OTHER): Payer: Medicare Other

## 2020-08-26 DIAGNOSIS — Z95810 Presence of automatic (implantable) cardiac defibrillator: Secondary | ICD-10-CM

## 2020-08-26 DIAGNOSIS — I5022 Chronic systolic (congestive) heart failure: Secondary | ICD-10-CM

## 2020-08-28 ENCOUNTER — Telehealth: Payer: Self-pay

## 2020-08-28 NOTE — Progress Notes (Signed)
EPIC Encounter for ICM Monitoring  Patient Name: Chad Christensen is a 54 y.o. male Date: 08/28/2020 Primary Care Physican: Daphine Deutscher Primary Cardiologist:McLean Electrophysiologist: Allred 9/22/2021OfficeWeight: 107lbs.  Spoke with patient and reports feeling well at this time.  Denies fluid symptoms.  Pt reports urinating more in the last couple of weeks but is drinking a lot of fluid as well.    CorvueThoracic impedance suggesting possible dryness.    Prescribed:Furosemide 40 mg 1 tablet as needed.  Labs: 06/08/2020 Creatinine0.95, BUN12, Potassium4.6, Sodium134, GFR>60 06/07/2020 Creatinine1.02, BUN11, Potassium4.0, Sodium135, GFR>60 05/10/2020 Creatinine0.97, BUN15, Potassium4.3, Sodium139, GFR>60 A complete set of results can be found in Results Review.  Recommendations: No changes and encouraged to call if experiencing any fluid symptoms.   Follow-up plan: ICM clinic phone appointment on10/25/2021. 91 day device clinic remote transmission12/05/2020.   EP/Cardiology Office Visits:09/13/2020 with Dr.McLean.  Due to see Dr [name] in 12/2020.  Copy of ICM check sent to Dr.Allred   3 month ICM trend: 08/26/2020    1 Year ICM trend:       Karie Soda, RN 08/28/2020 10:33 AM

## 2020-08-28 NOTE — Telephone Encounter (Signed)
Remote ICM transmission received.  Attempted call to patient regarding ICM remote transmission and no answer or voice mail. ° °

## 2020-09-05 ENCOUNTER — Other Ambulatory Visit: Payer: Self-pay | Admitting: *Deleted

## 2020-09-05 DIAGNOSIS — Z006 Encounter for examination for normal comparison and control in clinical research program: Secondary | ICD-10-CM

## 2020-09-05 NOTE — Progress Notes (Signed)
Order for 56M carotid for batwire study

## 2020-09-11 ENCOUNTER — Other Ambulatory Visit: Payer: Self-pay

## 2020-09-11 ENCOUNTER — Ambulatory Visit (HOSPITAL_BASED_OUTPATIENT_CLINIC_OR_DEPARTMENT_OTHER)
Admission: RE | Admit: 2020-09-11 | Discharge: 2020-09-11 | Disposition: A | Discharge status: Home / Self Care | Payer: Medicare Other | Source: Ambulatory Visit

## 2020-09-11 ENCOUNTER — Encounter: Payer: Medicare Other | Admitting: *Deleted

## 2020-09-11 ENCOUNTER — Encounter (HOSPITAL_COMMUNITY): Payer: Self-pay | Admitting: Cardiology

## 2020-09-11 ENCOUNTER — Ambulatory Visit (HOSPITAL_COMMUNITY)
Admission: RE | Admit: 2020-09-11 | Discharge: 2020-09-11 | Disposition: A | Discharge status: Home / Self Care | Payer: Medicare Other | Source: Ambulatory Visit | Attending: Cardiology | Admitting: Cardiology

## 2020-09-11 VITALS — BP 106/68 | HR 66 | Wt 103.4 lb

## 2020-09-11 VITALS — BP 106/68 | HR 73 | Wt 103.4 lb

## 2020-09-11 DIAGNOSIS — Z006 Encounter for examination for normal comparison and control in clinical research program: Secondary | ICD-10-CM

## 2020-09-11 DIAGNOSIS — I428 Other cardiomyopathies: Secondary | ICD-10-CM | POA: Insufficient documentation

## 2020-09-11 DIAGNOSIS — I110 Hypertensive heart disease with heart failure: Secondary | ICD-10-CM | POA: Diagnosis not present

## 2020-09-11 DIAGNOSIS — Z681 Body mass index (BMI) 19 or less, adult: Secondary | ICD-10-CM | POA: Insufficient documentation

## 2020-09-11 DIAGNOSIS — I5022 Chronic systolic (congestive) heart failure: Secondary | ICD-10-CM | POA: Insufficient documentation

## 2020-09-11 DIAGNOSIS — I5042 Chronic combined systolic (congestive) and diastolic (congestive) heart failure: Secondary | ICD-10-CM

## 2020-09-11 DIAGNOSIS — J449 Chronic obstructive pulmonary disease, unspecified: Secondary | ICD-10-CM | POA: Diagnosis not present

## 2020-09-11 DIAGNOSIS — Z87891 Personal history of nicotine dependence: Secondary | ICD-10-CM | POA: Diagnosis not present

## 2020-09-11 DIAGNOSIS — Z7984 Long term (current) use of oral hypoglycemic drugs: Secondary | ICD-10-CM | POA: Diagnosis not present

## 2020-09-11 DIAGNOSIS — Z7901 Long term (current) use of anticoagulants: Secondary | ICD-10-CM | POA: Diagnosis not present

## 2020-09-11 DIAGNOSIS — R64 Cachexia: Secondary | ICD-10-CM | POA: Insufficient documentation

## 2020-09-11 DIAGNOSIS — Z8673 Personal history of transient ischemic attack (TIA), and cerebral infarction without residual deficits: Secondary | ICD-10-CM | POA: Insufficient documentation

## 2020-09-11 DIAGNOSIS — I480 Paroxysmal atrial fibrillation: Secondary | ICD-10-CM | POA: Diagnosis not present

## 2020-09-11 DIAGNOSIS — Z8719 Personal history of other diseases of the digestive system: Secondary | ICD-10-CM | POA: Diagnosis not present

## 2020-09-11 DIAGNOSIS — Z79899 Other long term (current) drug therapy: Secondary | ICD-10-CM | POA: Insufficient documentation

## 2020-09-11 LAB — COMPREHENSIVE METABOLIC PANEL
ALT: 39 U/L (ref 0–44)
AST: 29 U/L (ref 15–41)
Albumin: 4.4 g/dL (ref 3.5–5.0)
Alkaline Phosphatase: 36 U/L — ABNORMAL LOW (ref 38–126)
Anion gap: 13 (ref 5–15)
BUN: 12 mg/dL (ref 6–20)
CO2: 26 mmol/L (ref 22–32)
Calcium: 9.5 mg/dL (ref 8.9–10.3)
Chloride: 99 mmol/L (ref 98–111)
Creatinine, Ser: 0.89 mg/dL (ref 0.61–1.24)
GFR calc non Af Amer: >60 mL/min (ref >60)
Glucose, Bld: 80 mg/dL (ref 70–99)
Potassium: 4.2 mmol/L (ref 3.5–5.1)
Sodium: 138 mmol/L (ref 135–145)
Total Bilirubin: 0.9 mg/dL (ref 0.3–1.2)
Total Protein: 7.1 g/dL (ref 6.5–8.1)

## 2020-09-11 LAB — DIGOXIN LEVEL: Digoxin Level: 0.9 ng/mL — ABNORMAL LOW (ref 1.0–2.0)

## 2020-09-11 LAB — TSH: TSH: 3.672 u[IU]/mL (ref 0.350–4.500)

## 2020-09-11 MED ORDER — AMIODARONE HCL 200 MG PO TABS: 100.0000 mg | ORAL_TABLET | Freq: Every day | ORAL | 1 refills | Status: DC

## 2020-09-11 MED ORDER — LOSARTAN POTASSIUM 25 MG PO TABS: 25.0000 mg | ORAL_TABLET | Freq: Every day | ORAL | 3 refills | Status: DC

## 2020-09-11 NOTE — Progress Notes (Signed)
PCP: Betti Cruz, PA-C EP: Dr. [name] Cardiology: Dr. Shirlee Latch  54 y.o. with history of nonischemic dilated cardiomyopathy and paroxysmal atrial fibrillation presents for followup of CHF.  Patient lives in [place], [place].  He gets sporadic cardiology care as it is hard for him to get a ride to [place] and he does not want to see any of the cardiologists in [place]. He is followed by Dr. [name] for his [facility] ICD.  I saw him initially in 3/19, when he was referred by Dr. [name] for left/right heart cath.  This showed no coronary disease and low but not markedly low cardiac output (CI 2.22).  Echo in 3/19 showed a severely dilated LV with EF 20%.    Echo in 6/21 showed EF <20%, severe LV dilation, normal RV size with mildly decreased systolic function, IVC normal.  RHC was done in 6/21, showing low filling pressures and CI 2.38 by Fick/2.68 by Thermo.  CPX in 6/21 showed both severe deconditioning and severe HF limitation.  In 7/21, he had barostimulation activator therapy placed.   He returns for followup of CHF. He walks with a cane chronically due to arthritis (neck and back pain, has had back surgery) and poor balance with prior CVA.  He has quit smoking.  Since barostimulator was placed, he seems to feel better.  Weight is stable.  He is riding his stationary bike for exercise.  He does some walking outside with his walker. He does not get short of breath walking with his walker, but he does get short of breath making up his bed and doing other forms of moderate exertion.  No chest pain.  No orthopnea/PND.    St Jude device interrogation: stable thoracic impedance.   Labs (1/20): digoxin 0.6, K 4.4, creatinine 0.76 Labs (6/20): digoxin 0.6, K 4.9, creatinine 1.05 Labs (6/21): LFTs normal Labs (7/21): K 4.6, creatinine 0.95 => 1.04  PMH: 1. H/o VT 2. H/o GI bleeding 3. Colon polyps 4. Remote substance abuse. 5. HTN 6. Gout 7. Atrial fibrillation: Paroxysmal.  8. ?COPD: Smokes  cigars.  9. Chronic systolic CHF: Nonischemic cardiomyopathy.  St Jude ICD.  - Echo (3/19): EF 20%, severe LV dilation, diffuse hypokinesis, mild-moderate MR, low normal RV function.  - LHC/RHC (3/19): No CAD; mean RA 2, PA 24/8, mean PCWP 13, CI 2.2.  - Echo (6/21): EF <20%, severe LV dilation, normal RV size with mildly decreased systolic function, IVC normal.  - RHC (6/21): mean RA 1, PA 16/3, mean PCWP 3, CI 2.38 Fick/2.68 thermo.   - CPX (6/21): peak VO2 10.7, RER 0.85 (submaximal), VE/VCO2 slope 80 => severe deconditioning and severe HF limitation.  PFTs were restrictive.  - Barostimulator implantation (7/21).  10. Prior CVA: Likely related to atrial fibrillation.  11. Back surgery.   Social History   Socioeconomic History  . Marital status: Single    Spouse name: Not on file  . Number of children: Not on file  . Years of education: Not on file  . Highest education level: Not on file  Occupational History  . Occupation: Disability  Tobacco Use  . Smoking status: Former Smoker    Packs/day: 0.30    Years: 25.00    Pack years: 7.50    Types: Cigars    Start date: 09/13/1982    Quit date: 04/2020    Years since quitting: 0.4  . Smokeless tobacco: Never Used  Vaping Use  . Vaping Use: Never used  Substance and Sexual Activity  .  Alcohol use: Not Currently    Alcohol/week: 2.0 - 3.0 standard drinks    Types: 2 - 3 Cans of beer per week  . 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 Sexual activity: Not Currently  Other Topics Concern  . Not on file  Social History Narrative   Lives in [place], [place].   Went to [employer] for 2.5 years.   Social Determinants of Health   Financial Resource Strain:   . Difficulty of Paying Living Expenses: Not on file  Food Insecurity:   . Worried About Programme researcher, broadcasting/film/video in the Last Year: Not on file  . Ran Out of Food in the Last Year: Not on file  Transportation Needs:   . Lack of Transportation  (Medical): Not on file  . Lack of Transportation (Non-Medical): Not on file  Physical Activity:   . Days of Exercise per Week: Not on file  . Minutes of Exercise per Session: Not on file  Stress:   . Feeling of Stress : Not on file  Social Connections:   . Frequency of Communication with Friends and Family: Not on file  . Frequency of Social Gatherings with Friends and Family: Not on file  . Attends Religious Services: Not on file  . Active Member of Clubs or Organizations: Not on file  . Attends Banker Meetings: Not on file  . Marital Status: Not on file  Intimate Partner Violence:   . Fear of Current or Ex-Partner: Not on file  . Emotionally Abused: Not on file  . Physically Abused: Not on file  . Sexually Abused: Not on file   Family History  Problem Relation Age of Onset  . Cardiomyopathy Brother        Dilated   ROS: All systems reviewed and negative except as per HPI.   Current Outpatient Medications  Medication Sig Dispense Refill  . amiodarone (PACERONE) 200 MG tablet Take 0.5 tablets (100 mg total) by mouth daily. 45 tablet 1  . clonazePAM (KLONOPIN) 0.5 MG tablet Take 0.5 mg by mouth 3 (three) times daily.     Marland Kitchen COLCRYS 0.6 MG tablet Take 0.6 mg by mouth 2 (two) times daily as needed (for gout flare up).     . dapagliflozin propanediol (FARXIGA) 10 MG TABS tablet Take 1 tablet (10 mg total) by mouth daily before breakfast. 30 tablet 6  . digoxin (LANOXIN) 0.125 MG tablet Take 1 tablet (0.125 mg total) by mouth daily. Telehealth visit 12/2019 with Dr. [name]. 90 tablet 1  . furosemide (LASIX) 40 MG tablet Take 40 mg by mouth daily as needed for fluid.     Marland Kitchen losartan (COZAAR) 25 MG tablet Take 1 tablet (25 mg total) by mouth at bedtime. 90 tablet 3  . mirtazapine (REMERON) 15 MG tablet Take 15 mg by mouth at bedtime.    Marland Kitchen nystatin (MYCOSTATIN) 100000 UNIT/ML suspension Take 5 mLs by mouth daily as needed (thrush).     . pramipexole (MIRAPEX) 0.125 MG tablet  Take 0.125 mg by mouth 3 (three) times daily as needed (restless leg).     Marland Kitchen rOPINIRole (REQUIP) 1 MG tablet Take 1 mg by mouth 3 (three) times daily.    . simvastatin (ZOCOR) 5 MG tablet Take 5 mg by mouth daily.    Marland Kitchen spironolactone (ALDACTONE) 25 MG tablet Take 1 tablet (25 mg total) by mouth daily. *Patient is overdue for an  appointment and needs to call and schedule for further refills* (Patient taking differently: Take 25 mg by mouth daily. ) 15 tablet 0  . warfarin (COUMADIN) 2 MG tablet Take 2 mg by mouth daily.     Marland Kitchen albuterol (ACCUNEB) 1.25 MG/3ML nebulizer solution Take 1.25 mg by nebulization every 6 (six) hours as needed for wheezing.  (Patient not taking: Reported on 09/11/2020)     No current facility-administered medications for this encounter.   BP 106/68   Pulse 66   Wt 46.9 kg (103 lb 6.4 oz)   SpO2 97%   BMI 14.02 kg/m  General: Thin, NAD Neck: No JVD, no thyromegaly or thyroid nodule.  Lungs: Clear to auscultation bilaterally with normal respiratory effort. CV: Nondisplaced PMI.  Heart regular S1/S2, no S3/S4, no murmur.  No peripheral edema.  No carotid bruit.  Normal pedal pulses.  Abdomen: Soft, nontender, no hepatosplenomegaly, no distention.  Skin: Intact without lesions or rashes.  Neurologic: Alert and oriented x 3.  Psych: Normal affect. Extremities: No clubbing or cyanosis.  HEENT: Normal.   Assessment/Plan: 1. Chronic systolic CHF: Echo 6/21 with EF <20%, severe LV dilation, normal RV size with mildly decreased systolic function, IVC normal.  LHC/RHC in 3/20 with no coronary disease, relatively preserved cardiac output.  Nonischemic cardiomyopathy with St Jude ICD.  Probably not CRT candidate, has IVCD but not classic LBBB. RHC in 6/21 showed low filling pressures and preserved cardiac output, but CPX (submaximal) in 6/21 showed severe limitation due to HF as well as severe limitation due to deconditioning. He is not volume overloaded on exam.  NYHA class III  symptoms, better since barostimulator implanted.  - He takes Lasix prn.  - Continue digoxin 0.125 daily, check level today.  - Continue spironolactone 25 mg daily.  - Continue dapagliflozin 10 mg daily.  - Increase losartan to 25 mg qhs. BMET today and in 10 days.  - Markedly deconditioned and cachectic, needs to be in better physical shape prior to any consideration of advanced therapies.  Luckily, he is feeling better with barostimulation and cardiac output was not low on recent RHC.  He has been drinking Ensure.  I would like him to do cardiac rehab but he does not have transportation.  2. Smoking/?COPD: Patient has quit smoking.  Restrictive PFTs with 6/21 CPX.  3. H/o VT: He is on amiodarone.  - Check LFTs, TSH today.  He should get a regular eye exam.  - He can decrease amiodarone to 100 mg daily.  4. Atrial fibrillation: Paroxysmal. NSR today.  - Continue warfarin.  Followup with HF pharmacist in 1 month for medication titration, see me in 2 months.   Marca Ancona 09/11/2020

## 2020-09-11 NOTE — Research (Signed)
38M [facility] visit  Patient is making progress. No med changes as of now. Patient weight is up from last time.    Patient ID: [id number] - _[id number]_ - [id number] Patient Initials: _B_ _W_ _E_  Visit Interval:    []   Screening/Baseline    []  Activation   []  0.5 Month       []  1 Month     []  2 Month     [x]  3 Month       []  6 Month     []  12 Month         []  Unscheduled, reason for visit: _________________________________________  SECTION Chad:  COVID-19 Like Illness Symptoms   Has the subject experienced any cold, flu or COVID-19 symptoms in the past 30 days? [x]   No  (skip to section C)     []  Yes, date of onset:  _____/_____ MMM/YYYY    If yes, check all symptoms that apply:   []  Fevers or chills   []   New or Worsening Cough  []  Productive  []  Dry     If yes to cough, indicate severity  []  Constant  []  Occasional, several per hour   []  New or worsened shortness of breath   []  Diarrhea   []  Altered or reduced sense of smell or taste   []  Muscle aches/Severe Fatigue   []  Chest pain or tightness   []  Sore throat   []  Nausea or vomiting  SECTION C:  COVID-19 Like Illness Testing   Has the patient been tested for COVID-19? [x]  No     []  Yes, date of test:  _____/_____ MMM/YYYY    If yes, test results:   []  Positive []  Negative []  Unknown  Has the patient been tested for COVID-19 Antibodies? [x]  No     []  Yes, date of test:  _____/_____ MMM/YYYY    If yes, test results:   []  Positive []  Negative []  Unknown  Has the patient been vaccinated for COVID-19? [x]  No     []  Yes, date of test:  _____/_____ MMM/YYYY  Has the patient been tested for Influenza ("flu")? [x]  No     []  Yes, date of test:  _____/_____ MMM/YYYY    If yes, test results:   []  Positive []  Negative []  Unknown  Has the patient been vaccinated for Influenza ("flu")? [x]  No     []  Yes, date of test:  _____/_____ MMM/YYYY  SECTION D:  COVID-19 Like Illness Exposure  Has the subject been told that they might have had COVID-19/have  symptoms suggestive of COVID-19? [x]    No   []  Yes   []   Unknown  Has the subject been exposed to anyone with known or suspected COVID-19? [x]    No   []  Yes   []   Unknown  Has the subject been told that they might have had the "flu" or influenza? [x]    No   []  Yes   []   Unknown  SECTION Christensen:  Effects of COVID Pandemic on Subject's Healthcare Interactions  Since the last study visit, did the subject feel the need to go to an emergency department or hospital for their heart failure but decided not to because of concerns about COVID-19? [x]   No   []  Yes   []  Unknown    If yes, how did they seek care (check all that apply):   []  Telemedicine visit []  In-person []  Clinic []  Urgent Care   []  Subject did not change hospital/ER use  due to COVID-19 []  Other, specify: ________________________  Since the last study visit, did the subject have a cardiology/HF related appointment cancelled/rescheduled due to COVID-19 pandemic? [x]   No   []  Yes, how many? ___________   []  Unknown  Since the last study visit, did the subject have any cardiology/HF related telemedicine visit due to COVID-19 pandemic? [x]   No   []  Yes, how many? ___________   []  Unknown  Since the last study visit, did the subject have a cardiology/HF procedure cancelled/rescheduled due to COVID-19 pandemic? [x]   No   []  Yes, how many? ___________   []  Unknown  SECTION F:  Effects of COVID Pandemic on Subject's Medications  Since the last visit, did any of their heart failure medications change or stop, even for a short time?   If yes, enter change into medication eCRF [x]   No   []  Yes, how many? ___________   []  Unknown    If yes, why:   []  Instructed by Doctor []  Self-discontinued []  Unknown []  Other: ________________  Since the last visit, was the subject prescribed any medications for COVID-19? [x]   No   []  Yes   []  Unknown    If yes, what medications (generic name):  SECTION G:  Effects of COVID Pandemic on Subject's Lifestyle   How has the subject's activity/exercise level changed due to COVID-19 pandemic? [x]   No change   []  More activity/exercise   []  Less activity/exercise  How has the subject's smoking habits changed due to COVID-19? [x]   Does not smoke   []  No change   []  Smoke more   []  Smoke less  How has the subject's alcohol drinking habits changed due to COVID-19? []   Does not drink   [x]  No change   []  Drink more   []  Drink less  Section H:  Signature    Person completing form (Print Name): ___________Kimberly :) ______________    Signature: :) _______ Date: _10/06/2021________________________                 Section A:  Administrative Section  Subject ID: _[id number] - _[id number]_ - [id number] Subject Initials: _B_ _W_ _E_  Visit Interval:    []   Screening/Baseline    []   Activation   []   0.5 Month       []   1 Month     []   2 Month     [x]   3 Month       []   6 Month     []   12 Month         []   Unscheduled, reason for visit: _________________________________________  Section Chad:  Physical Assessment   Date: _06_/_OCT_/_2021____     (DD / MMM / YYYY)   Weight: _103.4_  []  kg [x]  pounds  Blood Pressure: _106_ / _68_mmHg Heart Rate: _73_ bpm  Section Chad:  Device Information   Battery Battery Voltage: 3.01 V   Battery Life: 58.6 Months   RRT Date: 24/AUG/2021 (DD/MMM/YYYY)  Lead Impeadance Right Lead _1272_ Ohms    []   Low    []   High   Left Lead _______ Ohms    []   Low    [x]   High  Programmed Settings Pathway Pulse Width Amplitude Frequency   []   Left 125 5 40   [x]   Right     Section C:  Programming Information (12 Month and Unscheduled - not required)   []  Programming Not Done   Date: _06_/_OCT_/_2021____     (  DD / MMM / Annamarie Major) Device information, therapy schedule and programmed settings can be found on the session summary report  Site Clinician Present: _______Kimberly Ivory Broad :) ___________________  [employer] Employee helping with programming: ______Kyle  Wolf_______ []   N/A  Location of [employer] person: [x]   []  Onsite Remote  Subject Experience   1. Did the subject experience transient bradycardia or hypotension during device testing? [x]   []  Yes No   2. Did the subject experience transient electrical stimulation of non-vascular tissues? []  [x]  Yes No   3. If yes to either question above, was intervention beyond reprogramming needed or was it associated with an additional untoward event? []   [x]  Yes No   4a. Is there any indication that there has been unacceptable device interaction between the [employer] device and other implanted electrical stimulators or sensors device? []   [x]  []  Yes No N/A (no other device)   4b. 4b. If "Yes" to question 4a, please describe the device interaction and corrective action taken:                  Section D:  Arrythmia Interventions  Has the subject received a cardiac ablation since the last study visit? []    Yes  Date of last procedure: ____/____/_____ (DD/MMM/YYYY)   [x]  No   []  Unknown   If yes, what type and # of ablations Type []  Atrial     []  Ventricular    # of procedures: ____________   Was this pre-planned prior to [employer] implant? []  Yes    []  No (complete/update AE form)  Has the subject received a cardioversion since the last study visit? []  Yes  Date of last procedure: ____/____/_____ (DD/MMM/YYYY)   [x]  No   []  Unknown   If yes, what type and # of cardioversions? Type []  Medications     []  Electrical Shock    # of procedures: _____________   Was this pre-planned prior to [employer] implant? []  Yes    []  No (complete/update AE form)  Section Christensen:  Adverse Events  Have any new adverse events or updates to existing events occurred since last visit? []  Yes (complete/update AE form)   [x]  No  Section F:  Medication Changes   Have there been any changes to the subject's home use medications for Arrhythmia, Antiplatelet/Anticoagulation and Heart failure medications since the last visit? []  Yes (update  Med form)   [x]  No  Section G:  Comments   N/A                Section H:  Signature    Person completing form (Print Name): _____Kimberly :) _____    Signature: ___Kimberly :)_____ Date: ____10/06/2021__________

## 2020-09-11 NOTE — Patient Instructions (Signed)
DECREASE Amiodarone to 100 mg, one half tab daily INCREASE Losartan to 25 mg, one tab daily at bedtime  Labs today We will only contact you if something comes back abnormal or we need to make some changes. Otherwise no news is good news!  Labs needed in 7-10 days  Your physician recommends that you schedule a follow-up appointment in: 4 weeks with pharmacy team  Your physician recommends that you schedule a follow-up appointment in: 2 months with Dr Shirlee Latch   If you have any questions or concerns before your next appointment please send [place] a message through [facility] or call our office at [phone].    TO LEAVE A MESSAGE FOR THE NURSE SELECT OPTION 2, PLEASE LEAVE A MESSAGE INCLUDING: . YOUR NAME . DATE OF BIRTH . CALL BACK NUMBER . REASON FOR CALL**this is important as we prioritize the call backs  YOU WILL RECEIVE A CALL BACK THE SAME DAY AS LONG AS YOU CALL BEFORE 4:00 PM

## 2020-09-12 NOTE — Progress Notes (Signed)
[facility] patient #[id number]

## 2020-09-13 ENCOUNTER — Encounter (HOSPITAL_COMMUNITY): Payer: Medicare Other | Admitting: Cardiology

## 2020-09-27 ENCOUNTER — Telehealth: Payer: Self-pay | Admitting: Licensed Clinical Social Worker

## 2020-09-27 NOTE — Telephone Encounter (Signed)
Pt in need of transportation to appointment on 11/3 with PharmD. Appointment request sent to [facility], which per chart review pt has used previously.   CSW will f/u as needed to ensure ride scheduled.   Octavio Graves, MSW, LCSW [facility] Clinical Social Work

## 2020-09-30 ENCOUNTER — Other Ambulatory Visit: Payer: Self-pay | Admitting: Internal Medicine

## 2020-09-30 ENCOUNTER — Ambulatory Visit (INDEPENDENT_AMBULATORY_CARE_PROVIDER_SITE_OTHER): Payer: Medicare Other

## 2020-09-30 DIAGNOSIS — Z95810 Presence of automatic (implantable) cardiac defibrillator: Secondary | ICD-10-CM | POA: Diagnosis not present

## 2020-09-30 DIAGNOSIS — I5042 Chronic combined systolic (congestive) and diastolic (congestive) heart failure: Secondary | ICD-10-CM | POA: Diagnosis not present

## 2020-10-02 NOTE — Progress Notes (Signed)
EPIC Encounter for ICM Monitoring  Patient Name: Chad Christensen is a 54 y.o. male Date: 10/02/2020 Primary Care Physican: Daphine Deutscher Primary Cardiologist:McLean Electrophysiologist: Allred 9/22/2021OfficeWeight: 107lbs.  Spoke with patient and reports feeling well at this time.  Denies fluid symptoms.   CorvueThoracic impedancesuggesting normal fluid levels.  Impedance baseline trending higher.    Prescribed:Furosemide 40 mg 1 tablet as needed.  Labs: 06/08/2020 Creatinine0.95, BUN12, Potassium4.6, Sodium134, GFR>60 06/07/2020 Creatinine1.02, BUN11, Potassium4.0, Sodium135, GFR>60 05/10/2020 Creatinine0.97, BUN15, Potassium4.3, Sodium139, GFR>60 A complete set of results can be found in Results Review.  Recommendations: No changes and encouraged to call if experiencing any fluid symptoms.   Follow-up plan: ICM clinic phone appointment on11/29/2021. 91 day device clinic remote transmission12/05/2020.   EP/Cardiology Office Visits:12/05/2020 with Dr.McLean.  Due to see Dr [name] in 12/2020.  Copy of ICM check sent to Dr.Allred  3 month ICM trend: 09/30/2020    1 Year ICM trend:       Karie Soda, RN 10/02/2020 12:51 PM

## 2020-10-09 ENCOUNTER — Ambulatory Visit (HOSPITAL_COMMUNITY)
Admission: RE | Admit: 2020-10-09 | Discharge: 2020-10-09 | Disposition: A | Discharge status: Home / Self Care | Payer: Medicare Other | Source: Ambulatory Visit | Attending: Internal Medicine | Admitting: Internal Medicine

## 2020-10-09 ENCOUNTER — Other Ambulatory Visit: Payer: Self-pay

## 2020-10-09 VITALS — BP 98/68 | HR 65 | Wt 105.0 lb

## 2020-10-09 DIAGNOSIS — J449 Chronic obstructive pulmonary disease, unspecified: Secondary | ICD-10-CM | POA: Diagnosis not present

## 2020-10-09 DIAGNOSIS — I480 Paroxysmal atrial fibrillation: Secondary | ICD-10-CM | POA: Diagnosis not present

## 2020-10-09 DIAGNOSIS — I69898 Other sequelae of other cerebrovascular disease: Secondary | ICD-10-CM | POA: Diagnosis not present

## 2020-10-09 DIAGNOSIS — Z7984 Long term (current) use of oral hypoglycemic drugs: Secondary | ICD-10-CM | POA: Insufficient documentation

## 2020-10-09 DIAGNOSIS — Z95810 Presence of automatic (implantable) cardiac defibrillator: Secondary | ICD-10-CM | POA: Insufficient documentation

## 2020-10-09 DIAGNOSIS — I472 Ventricular tachycardia: Secondary | ICD-10-CM | POA: Diagnosis not present

## 2020-10-09 DIAGNOSIS — Z87891 Personal history of nicotine dependence: Secondary | ICD-10-CM | POA: Insufficient documentation

## 2020-10-09 DIAGNOSIS — M1990 Unspecified osteoarthritis, unspecified site: Secondary | ICD-10-CM | POA: Insufficient documentation

## 2020-10-09 DIAGNOSIS — R2689 Other abnormalities of gait and mobility: Secondary | ICD-10-CM | POA: Insufficient documentation

## 2020-10-09 DIAGNOSIS — I5022 Chronic systolic (congestive) heart failure: Secondary | ICD-10-CM | POA: Diagnosis present

## 2020-10-09 DIAGNOSIS — Z79899 Other long term (current) drug therapy: Secondary | ICD-10-CM | POA: Insufficient documentation

## 2020-10-09 DIAGNOSIS — I420 Dilated cardiomyopathy: Secondary | ICD-10-CM | POA: Diagnosis not present

## 2020-10-09 LAB — BASIC METABOLIC PANEL
Anion gap: 10 (ref 5–15)
BUN: 13 mg/dL (ref 6–20)
CO2: 29 mmol/L (ref 22–32)
Calcium: 9.8 mg/dL (ref 8.9–10.3)
Chloride: 100 mmol/L (ref 98–111)
Creatinine, Ser: 0.88 mg/dL (ref 0.61–1.24)
GFR, Estimated: >60 mL/min (ref >60)
Glucose, Bld: 89 mg/dL (ref 70–99)
Potassium: 4.2 mmol/L (ref 3.5–5.1)
Sodium: 139 mmol/L (ref 135–145)

## 2020-10-09 LAB — PROTIME-INR
INR: 1.1 (ref 0.8–1.2)
Prothrombin Time: 13.3 seconds (ref 11.4–15.2)

## 2020-10-09 NOTE — Patient Instructions (Addendum)
It was a pleasure seeing you today!  MEDICATIONS: -No medication changes today -Call if you have questions about your medications.  LABS: -We will call you if your labs need attention.  NEXT APPOINTMENT: Return to clinic in 2 months with Dr. McLean.  In general, to take care of your heart failure: -Limit your fluid intake to 2 Liters (half-gallon) per day.   -Limit your salt intake to ideally 2-3 grams (2000-3000 mg) per day. -Weigh yourself daily and record, and bring that "weight diary" to your next appointment.  (Weight gain of 2-3 pounds in 1 day typically means fluid weight.) -The medications for your heart are to help your heart and help you live longer.   -Please contact us before stopping any of your heart medications.  Call the clinic at [phone] with questions or to reschedule future appointments.  

## 2020-10-09 NOTE — Progress Notes (Signed)
PCP: Betti Cruz, PA-C EP: Dr. [name] Cardiology: Dr. Shirlee Latch  HPI:  54 y.o. with history of nonischemic dilated cardiomyopathy and paroxysmal atrial fibrillation presents for followup of CHF.  Patient lives in [place], [place].  He gets sporadic cardiology care as it is hard for him to get a ride to [place] and he does not want to see any of the cardiologists in [place]. He is followed by Dr. [name] for his [facility] ICD.  I saw him initially in 3/19, when he was referred by Dr. [name] for left/right heart cath.  This showed no coronary disease and low but not markedly low cardiac output (CI 2.22).  Echo in 3/19 showed a severely dilated LV with EF 20%.    Echo in 6/21 showed EF <20%, severe LV dilation, normal RV size with mildly decreased systolic function, IVC normal.  RHC was done in 6/21, showing low filling pressures and CI 2.38 by Fick/2.68 by Thermo.  CPX in 6/21 showed both severe deconditioning and severe HF limitation.  In 7/21, he had barostimulation activator therapy placed.   Recently returned for CHF followup on 09/11/20. He walked with a cane chronically due to arthritis (neck and back pain, has had back surgery) and poor balance with prior CVA.  He also quit smoking. Since barostimulator was placed, he reported feeling better. Weight was stable. He was riding his stationary bike for exercise and walking outside with his walker. He reported not getting short of breath walking with his walker but getting short of breath making up his bed and doing other forms of moderate exertion. No chest pain, orthopnea, or PND.    Today he returns to HF clinic for pharmacist medication titration. At last visit with MD losartan was increased to 25 mg QHS and amiodarone was decreased to 100 mg daily. He is overall feeling good today. He states that he has been having more good days than bad days (~2 times weekly) recently, which is an improvement. On his bad days, he feels much more fatigued and is  unable to walk despite using his walker. He denies dizziness or lightheadedness. No chest pain or palpitations. His breathing has improved and he no longer uses his albuterol nebulizer. He is able to walk ~5 yards before feeling SOB. Has not needed to use PRN furosemide for the past 3 months. No LEE and weight is stable at home around 102-104 lbs. Uses 3 pillows at home which is unchanged. Does not wake up at night SOB. His appetite is much improved and he follows a low-salt diet.  HF Medications: Losartan 25 mg QHS Spironolactone 25 mg daily Farxiga (dapagliflozin) 10 mg daily Digoxin 0.125 mg daily Furosemide 40 mg daily PRN  Has the patient been experiencing any side effects to the medications prescribed?  no  Does the patient have any problems obtaining medications due to transportation or finances?   No - Can afford medications (has Medicare part D). Does not drive and requires transportation assistance.  Understanding of regimen: good Understanding of indications: good Potential of compliance: good Patient understands to avoid NSAIDs. Patient understands to avoid decongestants.    Pertinent Lab Values: . Serum creatinine 0.88, BUN 13, Potassium 4.2, Sodium 139   Vital Signs: . Weight: 105 lb (last clinic weight: 103) . Blood pressure: 98/68  . Heart rate: 65   Assessment: 1. Chronic systolic CHF: Echo 6/21 with EF <20%, severe LV dilation, normal RV size with mildly decreased systolic function, IVC normal.  LHC/RHC in  3/20 with no coronary disease, relatively preserved cardiac output.  Nonischemic cardiomyopathy with St Jude ICD.  Probably not CRT candidate, has IVCD but not classic LBBB. RHC in 6/21 showed low filling pressures and preserved cardiac output, but CPX (submaximal) in 6/21 showed severe limitation due to HF as well as severe limitation due to deconditioning. - NYHA class III symptoms, better since barostimulator implanted. Euvolemic on exam - Continue furosemide 40  mg daily PRN - Continue losartan 25 mg QHS. Unable to uptritrate with soft BP in clinic.   - Continue spironolactone 25 mg daily - Continue Farxiga (dapagliflozin) 10 mg daily - Continue digoxin 0.125 daily - Markedly deconditioned and cachectic, needs to be in better physical shape prior to any consideration of advanced therapies.  Luckily, he is feeling better with barostimulation and cardiac output was not low on recent RHC.  He has been drinking Ensure. Previously encouraged to join cardiac rehab but he does not have transportation.   2. Smoking/?COPD: Patient has quit smoking.  Restrictive PFTs with 6/21 CPX.   3. H/o VT:  - Continue amiodarone 100 mg daily.  - Previously instructed to get a regular eye exam.   4. Atrial fibrillation: Paroxysmal. NSR today.  - Continue warfarin.   Plan: 1) Medication changes: Based on clinical presentation, vital signs and recent labs will not make any medication changes today given soft blood pressures in clinic. 2) Labs: BMET WNL in clinic today. Will also check INR today per patient request and fax results to PCP office. 3) Follow-up: 8 week appointment with Dr. Shirlee Latch on 12/05/20   Karle Plumber, PharmD, BCPS, BCCP, CPP Heart Failure Clinic Pharmacist [phone]

## 2020-10-11 ENCOUNTER — Other Ambulatory Visit: Payer: Self-pay | Admitting: *Deleted

## 2020-10-11 DIAGNOSIS — Z006 Encounter for examination for normal comparison and control in clinical research program: Secondary | ICD-10-CM

## 2020-10-11 NOTE — Progress Notes (Signed)
Orders placed for echo and carotid  

## 2020-11-04 ENCOUNTER — Ambulatory Visit (INDEPENDENT_AMBULATORY_CARE_PROVIDER_SITE_OTHER): Payer: Medicare Other

## 2020-11-04 DIAGNOSIS — I5022 Chronic systolic (congestive) heart failure: Secondary | ICD-10-CM | POA: Diagnosis not present

## 2020-11-04 DIAGNOSIS — Z95810 Presence of automatic (implantable) cardiac defibrillator: Secondary | ICD-10-CM

## 2020-11-07 ENCOUNTER — Telehealth: Payer: Self-pay | Admitting: Internal Medicine

## 2020-11-07 ENCOUNTER — Telehealth (HOSPITAL_COMMUNITY): Payer: Self-pay | Admitting: *Deleted

## 2020-11-07 NOTE — Telephone Encounter (Signed)
Pt aware and verbalized understanding.  

## 2020-11-07 NOTE — Telephone Encounter (Signed)
Patient states he was told to stop taking his medication and start taking Xarelto. Patient wanted to inform Dr. [name] and ask what to do with the medication he was told to stop taking.

## 2020-11-07 NOTE — Telephone Encounter (Signed)
Pt left VM asking if it is ok to take xarelto and farxiga together. Pts primary is switching pt from coumadin to xarelto tomorrow.  Routed to Dr.McLean for advice

## 2020-11-07 NOTE — Telephone Encounter (Signed)
Pt says he went to pcp yesterday and was told to stop coumadin tomorrow ans start Xarelto (pt doesn't know what dose as he hasn't picked it up from pharmacy will pcik up tomorrow) wanted to know Dr Shirlee Latch and Dr Barnabas Harries on this and if he is ok with this switch and also if he should continue [place]

## 2020-11-07 NOTE — Telephone Encounter (Signed)
Yes can take Xarelto and [place].

## 2020-11-08 NOTE — Progress Notes (Signed)
EPIC Encounter for ICM Monitoring  Patient Name: Chad Christensen is a 54 y.o. male Date: 11/08/2020 Primary Care Physican: Daphine Deutscher Primary Cardiologist:McLean Electrophysiologist: Allred 9/22/2021OfficeWeight: 107lbs.  Transmission reviewed.   CorvueThoracic impedancesuggesting normal fluid levels.  Impedance baseline trending higher.  Prescribed:Furosemide 40 mg 1 tablet as needed.  Labs: 06/08/2020 Creatinine0.95, BUN12, Potassium4.6, Sodium134, GFR>60 06/07/2020 Creatinine1.02, BUN11, Potassium4.0, Sodium135, GFR>60 05/10/2020 Creatinine0.97, BUN15, Potassium4.3, Sodium139, GFR>60 A complete set of results can be found in Results Review.  Recommendations: No changes.  Follow-up plan: ICM clinic phone appointment on1/03/2021. 91 day device clinic remote transmission12/05/2020.   EP/Cardiology Office Visits:12/05/2020 with Dr.McLean.  Due to see Dr [name] in 12/2020.  Copy of ICM check sent to Dr.Allred  3 month ICM trend: 11/04/2020    1 Year ICM trend:       Karie Soda, RN 11/08/2020 10:19 AM

## 2020-11-09 NOTE — Telephone Encounter (Signed)
Ok with me 

## 2020-11-11 ENCOUNTER — Ambulatory Visit (INDEPENDENT_AMBULATORY_CARE_PROVIDER_SITE_OTHER): Payer: Medicare Other

## 2020-11-11 DIAGNOSIS — I420 Dilated cardiomyopathy: Secondary | ICD-10-CM | POA: Diagnosis not present

## 2020-11-11 NOTE — Telephone Encounter (Signed)
Pt aware - updated medication list and pt will have INR checked again in the next 2 weeks by Dr Michel Santee in [place] - started Xarelto 2 days ago and d/c'd coumadin

## 2020-11-11 NOTE — Addendum Note (Signed)
Addended by: Burman Nieves T on: 11/11/2020 03:48 PM   Modules accepted: Orders

## 2020-11-13 ENCOUNTER — Telehealth: Payer: Self-pay

## 2020-11-13 LAB — CUP PACEART REMOTE DEVICE CHECK
Battery Remaining Longevity: 6 mo
Battery Remaining Percentage: 4 %
Battery Voltage: 2.62 V
Brady Statistic RV Percent Paced: 1 %
Date Time Interrogation Session: 20211205181907
HighPow Impedance: 91 Ohm
HighPow Impedance: 91 Ohm
Implantable Lead Implant Date: 20110909
Implantable Lead Location: 753860
Implantable Lead Model: 7122
Implantable Pulse Generator Implant Date: 20110909
Lead Channel Impedance Value: 1000 Ohm
Lead Channel Pacing Threshold Amplitude: 1 V
Lead Channel Pacing Threshold Pulse Width: 0.5 ms
Lead Channel Sensing Intrinsic Amplitude: 11.8 mV
Lead Channel Setting Pacing Amplitude: 2.5 V
Lead Channel Setting Pacing Pulse Width: 0.5 ms
Lead Channel Setting Sensing Sensitivity: 0.5 mV
Pulse Gen Serial Number: 614505

## 2020-11-13 NOTE — Telephone Encounter (Signed)
Merlin alert received- Review of trend show rapid increase in RV impedance (1000 ohms on this transmission) and CorVue reference line.   Pt had Barostim implant completed 06/07/20 which would explain the Corvue impedence increase.  Unsure if the RV ead impedence correlates as well since the numbers started increasing at time of implant.  There has also been decrease in RV sensitivity.  Pt paces in RV lead <1% of the time.  Forwarding to MD for review and recommendations.    Pt due for in-clinic check 12/2020, forwarding to scheduling to contact patient since he is not currently scheduled

## 2020-11-20 NOTE — Progress Notes (Signed)
Remote ICD transmission.   

## 2020-11-25 ENCOUNTER — Telehealth (HOSPITAL_COMMUNITY): Payer: Self-pay | Admitting: Licensed Clinical Social Worker

## 2020-11-25 NOTE — Telephone Encounter (Signed)
CSW called pt to inquire if he needs help with transportation to his appts at [facility] on 12/30 next week.  Pt states he will need transportation to [place] so CSW submitted ride request through [employer].  Will continue to follow and assist as needed  Burna Sis, LCSW Clinical Social Worker [facility] Desk#: [phone] Cell#: [phone]

## 2020-11-27 ENCOUNTER — Telehealth: Payer: Self-pay | Admitting: Physician Assistant

## 2020-11-27 NOTE — Telephone Encounter (Signed)
Patient called after hours to discuss amiodarone.   Patient currently taking amiodarone 200mg . Per last office visit not patient was on amiodarone 100mg  (1/2 table). Patient reports being told to take 1 tablet over phone. I could not found any encounter.  Currently he is feeling "great". I have advised to continue to take Amiodarone 200mg  qd. He has enough supplies to last until office visit with Dr. on 12/05/20.

## 2020-11-28 ENCOUNTER — Other Ambulatory Visit (HOSPITAL_COMMUNITY): Payer: Self-pay | Admitting: *Deleted

## 2020-12-03 NOTE — Telephone Encounter (Signed)
He has scheduled office visit with Dr Shirlee Latch as well as research 12/30.  Perhaps we can check his device then.

## 2020-12-04 ENCOUNTER — Telehealth: Payer: Self-pay | Admitting: Internal Medicine

## 2020-12-04 NOTE — Telephone Encounter (Signed)
   Doesn't need phone note

## 2020-12-05 ENCOUNTER — Ambulatory Visit (HOSPITAL_BASED_OUTPATIENT_CLINIC_OR_DEPARTMENT_OTHER)
Admission: RE | Admit: 2020-12-05 | Discharge: 2020-12-05 | Disposition: A | Discharge status: Home / Self Care | Payer: Medicare Other | Source: Ambulatory Visit | Attending: Cardiology | Admitting: Cardiology

## 2020-12-05 ENCOUNTER — Ambulatory Visit (HOSPITAL_COMMUNITY)
Admission: RE | Admit: 2020-12-05 | Discharge: 2020-12-05 | Disposition: A | Discharge status: Home / Self Care | Payer: Medicare Other | Source: Ambulatory Visit | Attending: Cardiology | Admitting: Cardiology

## 2020-12-05 ENCOUNTER — Other Ambulatory Visit: Payer: Self-pay

## 2020-12-05 ENCOUNTER — Encounter (HOSPITAL_COMMUNITY): Payer: Self-pay | Admitting: Cardiology

## 2020-12-05 ENCOUNTER — Encounter: Payer: Medicare Other | Admitting: *Deleted

## 2020-12-05 ENCOUNTER — Ambulatory Visit (HOSPITAL_BASED_OUTPATIENT_CLINIC_OR_DEPARTMENT_OTHER)
Admission: RE | Admit: 2020-12-05 | Discharge: 2020-12-05 | Disposition: A | Discharge status: Home / Self Care | Payer: Medicare Other | Source: Ambulatory Visit

## 2020-12-05 VITALS — BP 98/80 | HR 65 | Wt 106.0 lb

## 2020-12-05 VITALS — BP 112/70 | HR 71 | Wt 106.6 lb

## 2020-12-05 DIAGNOSIS — Z87891 Personal history of nicotine dependence: Secondary | ICD-10-CM | POA: Diagnosis not present

## 2020-12-05 DIAGNOSIS — Z7901 Long term (current) use of anticoagulants: Secondary | ICD-10-CM | POA: Diagnosis not present

## 2020-12-05 DIAGNOSIS — Z8673 Personal history of transient ischemic attack (TIA), and cerebral infarction without residual deficits: Secondary | ICD-10-CM | POA: Diagnosis not present

## 2020-12-05 DIAGNOSIS — Z006 Encounter for examination for normal comparison and control in clinical research program: Secondary | ICD-10-CM

## 2020-12-05 DIAGNOSIS — I5022 Chronic systolic (congestive) heart failure: Secondary | ICD-10-CM

## 2020-12-05 DIAGNOSIS — M549 Dorsalgia, unspecified: Secondary | ICD-10-CM | POA: Diagnosis not present

## 2020-12-05 DIAGNOSIS — I480 Paroxysmal atrial fibrillation: Secondary | ICD-10-CM | POA: Diagnosis not present

## 2020-12-05 DIAGNOSIS — Z79899 Other long term (current) drug therapy: Secondary | ICD-10-CM | POA: Insufficient documentation

## 2020-12-05 DIAGNOSIS — M1990 Unspecified osteoarthritis, unspecified site: Secondary | ICD-10-CM | POA: Insufficient documentation

## 2020-12-05 DIAGNOSIS — I110 Hypertensive heart disease with heart failure: Secondary | ICD-10-CM | POA: Insufficient documentation

## 2020-12-05 DIAGNOSIS — M542 Cervicalgia: Secondary | ICD-10-CM | POA: Insufficient documentation

## 2020-12-05 DIAGNOSIS — I428 Other cardiomyopathies: Secondary | ICD-10-CM | POA: Insufficient documentation

## 2020-12-05 LAB — COMPREHENSIVE METABOLIC PANEL
ALT: 13 U/L (ref 0–44)
AST: 14 U/L — ABNORMAL LOW (ref 15–41)
Albumin: 4.3 g/dL (ref 3.5–5.0)
Alkaline Phosphatase: 39 U/L (ref 38–126)
Anion gap: 10 (ref 5–15)
BUN: 16 mg/dL (ref 6–20)
CO2: 26 mmol/L (ref 22–32)
Calcium: 9.6 mg/dL (ref 8.9–10.3)
Chloride: 102 mmol/L (ref 98–111)
Creatinine, Ser: 0.94 mg/dL (ref 0.61–1.24)
GFR, Estimated: >60 mL/min (ref >60)
Glucose, Bld: 94 mg/dL (ref 70–99)
Potassium: 4.1 mmol/L (ref 3.5–5.1)
Sodium: 138 mmol/L (ref 135–145)
Total Bilirubin: 0.9 mg/dL (ref 0.3–1.2)
Total Protein: 7.1 g/dL (ref 6.5–8.1)

## 2020-12-05 LAB — CBC
HCT: 41.9 % (ref 39.0–52.0)
Hemoglobin: 14 g/dL (ref 13.0–17.0)
MCH: 33.3 pg (ref 26.0–34.0)
MCHC: 33.4 g/dL (ref 30.0–36.0)
MCV: 99.8 fL (ref 80.0–100.0)
Platelets: 232 10*3/uL (ref 150–400)
RBC: 4.2 MIL/uL — ABNORMAL LOW (ref 4.22–5.81)
RDW: 13.1 % (ref 11.5–15.5)
WBC: 9.5 10*3/uL (ref 4.0–10.5)
nRBC: 0 % (ref 0.0–0.2)

## 2020-12-05 LAB — DIGOXIN LEVEL: Digoxin Level: 1 ng/mL (ref 0.8–2.0)

## 2020-12-05 LAB — TSH: TSH: 1.973 u[IU]/mL (ref 0.350–4.500)

## 2020-12-05 MED ORDER — ENTRESTO 24-26 MG PO TABS: 1.0000 | ORAL_TABLET | Freq: Two times a day (BID) | ORAL | 3 refills | Status: DC

## 2020-12-05 MED ORDER — AMIODARONE HCL 200 MG PO TABS
100.0000 mg | ORAL_TABLET | Freq: Every day | ORAL | 1 refills | Status: DC
Start: 2020-12-05 — End: 2020-12-09

## 2020-12-05 NOTE — Progress Notes (Signed)
  Echocardiogram 2D Echocardiogram with 3D has been performed.  Leta Jungling M 12/05/2020, 2:57 PM

## 2020-12-05 NOTE — Telephone Encounter (Signed)
St Jude aware and will assist in checking patients device at HF visit.

## 2020-12-05 NOTE — Research (Signed)
[facility] 29M visit   Patient doing well, no complaints of chest pain or shortness of breath.  Patient saw Dr Shirlee Latch prior to visit and he made some med changes. Stopped his losartan, started entresto, and restart his amiodarone      Section A:  Administrative Section  Subject ID: __[id number]__ - _009_ - 015 Subject Initials: _B_ _W_ _E_  Visit Interval:     Screening/Baseline     Activation    0.5 Month        1 Month      2 Month      3 Month        6 Month      12 Month          Unscheduled, reason for visit: _________________________________________  Section B:  Device Information   Battery Battery Voltage: 2.97 V   Battery Life: 64 Months   RRT Date: 05-Apr-2026 (DD/MMM/YYYY)  Lead [employer] Right Lead __1114__ Ohms     Low     High   Left Lead ________ Ohms     Low     High  Programmed Settings Pathway Pulse Width Amplitude Frequency    Left 125 4.6 40    Right     Section C:  Programming Information (12 Month and Unscheduled - not required)  Date: _30_/_DEC_/_2021_     (DD / MMM / YYYY) Device information, therapy schedule and programmed settings can be found on the session summary report  Site Clinician Present: ___[name] :) _________________  [employer] helping with programming: __Kyle Wolf_______________________________  N/A  Location of CVRx person:   Onsite Remote  Subject Experience   1. Did the subject experience transient bradycardia or hypotension during device testing?   Yes No   2. Did the subject experience transient electrical stimulation of non-vascular tissues?   Yes No   3. If yes to either question above, was intervention beyond reprogramming needed or was it associated with an additional untoward event?   Yes No   4a. Is there any indication that there has been unacceptable device interaction between the CVRx device and other implanted electrical stimulators or sensors device?     Yes No N/A (no other device)   4b.                   Section D:  Arrythmia Interventions  Has the subject received a cardiac ablation since the last study visit?  Yes  Date of last procedure:   ____/____/_____ (DD/MMM/YYYY)    No    Unknown   If yes, what type and # of ablations Type   Atrial       Ventricular    # of procedures: ____________   Was this pre-planned prior to CVRx implant?  Yes     No (complete/update AE form)  Has the subject received a cardioversion since the last study visit?  Yes  Date of last procedure:   ____/____/_____ (DD/MMM/YYYY)    No    Unknown   If yes, what type and # of cardioversions? Type  Medications      Electrical Shock    # of procedures: _____________   Was this pre-planned prior to CVRx implant?  Yes      No (complete/update AE form)    Section E:  Adverse Events (N/A for Implant Interval)  Have any new adverse events or updates to existing events occurred since last visit?   Yes (complete/update AE form)   [  x]  No  Section F:  Medication Changes   Have there been any changes to the subject's home use medications for Arrhythmia, Antiplatelet/Anticoagulation and Heart failure medications since the last visit? [x]  Yes (update Med form)   []  No  Section G:  Comments                     Section H:  Signature    Person completing form (Print Name): __Kimberly Tashyra Adduci :) ___________________    Signature: ___Kimberly :) ________ Date: ___30/Dec/2021_______________________    Section A:  Administrative Section  Subject ID: _[id number]_ - _009_ - 015 Subject Initials: _B_ _W_ _E_  Visit Interval:    []  Screening/Baseline    []  Activation   []  0.5 Month       []  1 Month     []  2 Month     []  3 Month       [x]  6 Month     []  12 Month         []  Unscheduled, reason for visit: _________________________________________  Section B:  Physical Assessment   Date:     _30_/_DEC_/_2021_   (DD / MMM / YYYY)  Weight: _106_  []  kg    [x]  pounds Height (Screening Visit Only): ________  []  cm []  inches  Blood Pressure: _98_  / _80_mmHg Heart Rate: _65_ bpm  Section E:  Signature     Person completing form (Print Name): 12-10-2001 :) _______________  Signature: 09[birth year]1993 :) ____ Date: _12/30/2021_________________    SECTION A:  Administrative Section  Patient ID: - _[id number]_ - [id number] Patient Initials: _B_ _W_ _E_  Visit Interval:    []  Screening/Baseline*    []  Activation   []  0.5 Month       []  1 Month     []  2 Month     []  3 Month       [x]  6 Month     []  12 Month         []  Unscheduled, reason for visit: _________________________________________  * For Screening / Baseline only the questions are based on 30 days prior to consent.  SECTION B:  COVID-19 Like Illness Symptoms   Has the subject experienced any cold, flu or COVID-19 symptoms since the last study visit? [x]  No  (skip to section C)     []  Yes, date of onset:  _____/_____ MMM/YYYY    If yes, check all symptoms that apply:   []  Fevers or chills   []  New or Worsening Cough  []  Productive  []  Dry    []  If yes to cough, indicate severity  []  Constant  []  Occasional, several per hour   []  New or worsened shortness of breath   []  Diarrhea   []  Altered or reduced sense of smell or taste   []  Muscle aches/Severe Fatigue   []  Chest pain or tightness   []  Sore throat   []  Nausea or vomiting  SECTION C:  COVID-19 Like Illness Testing   Has the patient been tested for COVID-19 since the last study visit? [x]  No     []  Yes, date of test:  _____/_____ MMM/YYYY    If yes, test results:   []  Positive []  Negative []  Unknown  Has the patient been tested for COVID-19 Antibodies since the last study visit? [x]  No     []  Yes, date of test:  _____/_____ MMM/YYYY    If yes, test results:   []   Positive []  Negative []  Unknown  Has the patient been vaccinated for COVID-19 since the  last study visit? []  No     [x]  Yes, date of test:  _24_/_dec_/_2021_ MMM/YYYY  Has the patient been tested for Influenza ("flu") since the last study visit? [x]  No     []  Yes, date of test:  _____/_____ MMM/YYYY    If yes, test results:   []  Positive []  Negative []  Unknown  Has the patient been vaccinated for Influenza ("flu") since the last study visit? []  No     [x]  Yes, date of test:  _24_/_dec_/_2021_ MMM/YYYY  SECTION D:  COVID-19 Like Illness Exposure  Has the subject been told that they might have had COVID-19/have symptoms suggestive of COVID-19 since the last study visit? [x]  No   []  Yes   []  Unknown  Has the subject been exposed to anyone with known or suspected COVID-19 since the last study visit? [x]  No   []  Yes   []  Unknown  Has the subject been told that they might have had the "flu" or influenza since the last study visit? [x]  No   []  Yes   []  Unknown  SECTION E:  Effects of COVID Pandemic on Subject's Healthcare Interactions  Since the last study visit, did the subject feel the need to go to an emergency department or hospital for their heart failure but decided not to because of concerns about COVID-19? [x]  No   []  Yes   []  Unknown    If yes, how did they seek care (check all that apply):   []  Telemedicine visit []  In-person []  Clinic []  Urgent Care   []  Subject did not change hospital/ER use due to COVID-19 []  Other, specify: ________________________  Since the last study visit, did the subject have a cardiology/HF related appointment cancelled/rescheduled due to COVID-19 pandemic? [x]  No   []  Yes, how many? ___________   []  Unknown  Since the last study visit, did the subject have any cardiology/HF related telemedicine visit due to COVID-19 pandemic? [x]  No   []  Yes, how many? ___________   []  Unknown  Since the last study visit, did the subject have a cardiology/HF procedure cancelled/rescheduled due to COVID-19 pandemic? [x]  No   []  Yes, how many? ___________    []  Unknown  SECTION F:  Effects of COVID Pandemic on Subject's Medications  Since the last visit, did any of their heart failure medications change or stop, even for a short time?   If yes, enter change into medication eCRF [x]  No   []  Yes, how many? ___________   []  Unknown    If yes, why:   []  Instructed by Doctor []  Self-discontinued []  Unknown  Other: ________________  Since the last visit, was the subject prescribed any medications for COVID-19? [x]  No   []  Yes   []  Unknown    If yes, what medications (generic name):  SECTION G:  Effects of COVID Pandemic on Subject's Lifestyle  How has the subject's activity/exercise level changed due to COVID-19 pandemic? [x]  No change   []  More activity/exercise   []  Less activity/exercise  How has the subject's smoking habits changed due to COVID-19? []  Does not smoke   [x]  No change   []  Smoke more   []  Smoke less  How has the subject's alcohol drinking habits changed due to COVID-19? []  Does not drink   [x]  No change   []  Drink more   []  Drink less  Section H:  Signature  Person completing form (Print Name): Chad Christensen :) ________________  Signature: Chad Christensen :) ________ Date: _12/30/2021________________    Subject ID: _[id number]_ - _009_ - 015 Subject Initials:  _B_ _W_ _E_ Visit Interval:  []   Baseline     [x]  6 Month  Section B:  NYHA   NYHA Classification Date:    _30_/_DEC_/_2021__  (DD / MMM / Annamarie Major)  Select One Class Subject Symptoms  []  I No limitations of physical activity, No undue fatigue, palpitation or dyspnea  []  II Slight limitation of physical activity, Comfortable at rest, Less than ordinary activity results in fatigue, Palpitation, or dyspnea  [x]  III Marked limitation of physical activity, Comfortable at rest, Less than ordinary activity results in fatigue, palpitation, or dyspnea  []  IV Unable to carry out any physical activity without discomfort, Symptoms of cardiac insufficiency at rest,  Physical activity causes increased discomfort  Name of person conducting NYHA: __[name] :) _____________________   Six Minute Hall Walk Test Date:    _30_/_DEC_/_2021_  (DD / MMM / Annamarie Major)  Distance Walked _122_ meters  Were there any devices used to assist the subject in walking (e.g. cane, walker, etc.)? []  No   [x]  Yes specify:___walker____________________  Was the walk terminated before 6 minutes? [x]  No   []  Yes specify reason: (select all that apply)    []  Angina    []   Dyspnea    []   Fatigue    []   Dizziness    []   Syncope    []   Other, specify:  Name of person conducting 6-Minute Margo Aye Walk: Chad Christensen :) _________   Page 1 of 1     Confidential   Form Protocol [phone] Rev. B dated 13-Sep-2019   Effective Date: 26-Oct-2019    Patient ID: _[id number]_ - _[id number]_ - 015 Patient Initials: _B_ _W_ _E_ Visit Interval:  []   Baseline     [x]  6 Month  Section D:  MLWHF   [place] Living With Heart Failure Questionnaire Date:    _30_/_DEC_/_2021__  (DD / MMM / YYYY)  These questions concern how your heart failure (heart condition) has prevented you from living as you wanted during the last month.  These items listed below describe different ways some people are affected.  If you are sure an item does not apply to you or is not related to your heart failure then circle 0 (no) and go on to the next item.  If an item does apply to you, then circle the number rating how much it prevented you from living as you wanted.  Did your heart failure prevent you from living as you wanted during the last month by:   No Very little    Very much  1. Causing swelling in your ankles, legs, etc.? 0[x]  1[]  2[]  3[]  4[]  5[]   2. Making you sit or lie down to rest during the day? 0[]  1[x]  2[]  3[]  4[]  5[]   3. Making your walking about or climbing stairs difficult? 0[]  1[]  2[]  3[x]  4[]  5[]   4. Making your working around the house or yard difficult? 0[]  1[]  2[x]  3[]  4[]  5[]   5. Making your going  places away from home difficult? 0[]  1[]  2[]  3[]  4[x]  5[]   6. Making your sleeping well at night difficult? 0[]  1[]  2[]  3[]  4[]  5[x]   7. Making your relating to or doing things with your friends or family difficult? 0[x]  1[]  2[]   3[]  4[]  5[]   8. Making your working to earn a living difficult? 0[x]   1[]  2[]  3[]  4[]  5[]   9. Making your recreational pastimes, sports or hobbies difficult? 0[]  1[]  2[]  3[]  4[]  5[x]   10. Making your sexual activities difficult? 0[x]  1[]  2[]  3[]  4[]  5[]   11. Making you eat less of the foods you like? 0[x]  1[]  2[]  3[]  4[]  5[]   12. Making you short of breath? 0[]  1[]  2[x]  3[]  4[]  5[]   13. Making you tired, fatigued, or low on energy? 0[]  1[]  2[x]  3[]  4[]  5[]   14. Making you stay in a hospital? 0[x]  1[]  2[]  3[]  4[]  5[]   15. Costing you money for medical care? 0[x]  1[]  2[]  3[]  4[]  5[]   16. Giving you side effects from medications? 0[x]  1[]  2[]  3[]  4[]  5[]   17. Making you feel you are a burden to your family or friends? 0[]  1[]  2[]  3[]  4[x]  5[]   18. Making you feel a loss of self-control in your life? 0[]  1[]  2[x]  3[]  4[]  5[]   19. Making you worry? 0[]  1[]  2[]  3[x]  4[]  5[]   20. Making it difficult for you to concentrate or remember things? 0[]  1[]  2[x]  3[]  4[]  5[]   21 Making you feel depressed? 0[]  1[x]  2[]  3[]  4[]  5[]   Section E:  Signature Section  Person Administering Questionnaire Name: (person who read first question and handed questionnaire to subject) __[name] :) ___ (Print) 12/05/2020   __[name] :) ___ (Sign) (Date)  Person Completing Questionnaire Name: (e.g. subject, person reading questionnaire, etc.) Chad Christensen :) ____ (Print) 12/05/2020   __[name] :)____________ (Sign) (Date)     Current Outpatient Medications:  .  amiodarone (PACERONE) 200 MG tablet, Take 0.5 tablets (100 mg total) by mouth daily., Disp: 45 tablet, Rfl: 1 .  clonazePAM (KLONOPIN) 0.5 MG tablet, Take 0.5 mg by mouth daily as needed for  anxiety., Disp: , Rfl:  .  COLCRYS 0.6 MG tablet, Take 0.6 mg by mouth 2 (two) times daily as needed (for gout flare up). , Disp: , Rfl:  .  dapagliflozin propanediol (FARXIGA) 10 MG TABS tablet, Take 1 tablet (10 mg total) by mouth daily before breakfast., Disp: 30 tablet, Rfl: 6 .  digoxin (LANOXIN) 0.125 MG tablet, TAKE 1 TABLET BY MOUTH DAILY, Disp: 90 tablet, Rfl: 0 .  furosemide (LASIX) 40 MG tablet, Take 40 mg by mouth daily as needed for fluid. , Disp: , Rfl:  .  mirtazapine (REMERON) 15 MG tablet, Take 15 mg by mouth at bedtime., Disp: , Rfl:  .  nystatin (MYCOSTATIN) 100000 UNIT/ML suspension, Take 5 mLs by mouth daily as needed (thrush)., Disp: , Rfl:  .  pramipexole (MIRAPEX) 0.125 MG tablet, Take 0.125 mg by mouth 3 (three) times daily as needed (restless leg). , Disp: , Rfl:  .  rivaroxaban (XARELTO) 20 MG TABS tablet, Take 20 mg by mouth daily with supper., Disp: , Rfl:  .  rOPINIRole (REQUIP) 1 MG tablet, Take 1 mg by mouth 3 (three) times daily., Disp: , Rfl:  .  sacubitril-valsartan (ENTRESTO) 24-26 MG, Take 1 tablet by mouth 2 (two) times daily., Disp: 60 tablet, Rfl: 3 .  simvastatin (ZOCOR) 5 MG tablet, Take 5 mg by mouth daily., Disp: , Rfl:  .  spironolactone (ALDACTONE) 25 MG tablet, Take 1 tablet (25 mg total) by mouth daily. *Patient is overdue for an appointment and needs to call and schedule for further refills*, Disp: 15 tablet, Rfl: 0

## 2020-12-05 NOTE — Progress Notes (Signed)
Carotid artery duplex completed. Refer to "CV Proc" under chart review to view preliminary results.  12/05/2020 1:42 PM Eula Fried., MHA, RVT, RDCS, RDMS

## 2020-12-05 NOTE — Patient Instructions (Addendum)
RESTART Amiodarone 100mg  (1 tablet) daily  START Entresto 24/26mg  (1 tablet) twice dialy  STOP Losartan  Labs done today, your results will be available in MyChart, we will contact you for abnormal readings.  Your physician recommends that you have repeat labs drawn in 7-10 days. A prescription has been sent with you to have it done locally  Your physician recommends that you schedule a follow-up appointment in: 6-8  weeks   If you have any questions or concerns before your next appointment please send 9-10 a message through [place] or call our office at [phone].    TO LEAVE A MESSAGE FOR THE NURSE SELECT OPTION 2, PLEASE LEAVE A MESSAGE INCLUDING: . YOUR NAME . DATE OF BIRTH . CALL BACK NUMBER . REASON FOR CALL**this is important as we prioritize the call backs  YOU WILL RECEIVE A CALL BACK THE SAME DAY AS LONG AS YOU CALL BEFORE 4:00 PM

## 2020-12-06 LAB — ECHOCARDIOGRAM COMPLETE
Area-P 1/2: 3.91 cm2
S' Lateral: 5 cm
Weight: 1705.6 oz

## 2020-12-06 NOTE — Progress Notes (Signed)
PCP: Betti Cruz, PA-C EP: Dr. [name] Cardiology: Dr. Shirlee Latch  54 y.o. with history of nonischemic dilated cardiomyopathy and paroxysmal atrial fibrillation presents for followup of CHF.  Patient lives in [place], [place].  He gets sporadic cardiology care as it is hard for him to get a ride to [place] and he does not want to see any of the cardiologists in [place]. He is followed by Dr. [name] for his [facility] ICD.  I saw him initially in 3/19, when he was referred by Dr. [name] for left/right heart cath.  This showed no coronary disease and low but not markedly low cardiac output (CI 2.22).  Echo in 3/19 showed a severely dilated LV with EF 20%.    Echo in 6/21 showed EF <20%, severe LV dilation, normal RV size with mildly decreased systolic function, IVC normal.  RHC was done in 6/21, showing low filling pressures and CI 2.38 by Fick/2.68 by Thermo.  CPX in 6/21 showed both severe deconditioning and severe HF limitation.  In 7/21, he had barostimulation activation therapy placed.   He returns for followup of CHF. He walks with a cane chronically due to arthritis (neck and back pain, has had back surgery) and poor balance with prior CVA.  He has quit smoking.  Since barostimulator was placed, he seems to feel better.  Weight is up 3 lbs.  He has not been taking Lasix. He is able to walk short distances now without his cane. He rides his stationary bike for 5 minutes twice a day. No lightheadedness, BP seems to have been higher recently. No orthopnea/PND.  No chest pain.    St Jude device interrogation: stable thoracic impedance, no VT, approximately 3.8 months battery life.   Labs (1/20): digoxin 0.6, K 4.4, creatinine 0.76 Labs (6/20): digoxin 0.6, K 4.9, creatinine 1.05 Labs (6/21): LFTs normal Labs (7/21): K 4.6, creatinine 0.95 => 1.04 Labs (10/21): digoxin 0.9 Labs (11/21): K 4.2, creatinine 0.88  PMH: 1. H/o VT 2. H/o GI bleeding 3. Colon polyps 4. Remote substance abuse. 5.  HTN 6. Gout 7. Atrial fibrillation: Paroxysmal.  8. ?COPD: Smokes cigars.  9. Chronic systolic CHF: Nonischemic cardiomyopathy.  St Jude ICD.  - Echo (3/19): EF 20%, severe LV dilation, diffuse hypokinesis, mild-moderate MR, low normal RV function.  - LHC/RHC (3/19): No CAD; mean RA 2, PA 24/8, mean PCWP 13, CI 2.2.  - Echo (6/21): EF <20%, severe LV dilation, normal RV size with mildly decreased systolic function, IVC normal.  - RHC (6/21): mean RA 1, PA 16/3, mean PCWP 3, CI 2.38 Fick/2.68 thermo.   - CPX (6/21): peak VO2 10.7, RER 0.85 (submaximal), VE/VCO2 slope 80 => severe deconditioning and severe HF limitation.  PFTs were restrictive.  - Barostimulator implantation (7/21).  10. Prior CVA: Likely related to atrial fibrillation.  11. Back surgery.   Social History   Socioeconomic History  . Marital status: Single    Spouse name: Not on file  . Number of children: Not on file  . Years of education: Not on file  . Highest education level: Not on file  Occupational History  . Occupation: Disability  Tobacco Use  . Smoking status: Former Smoker    Packs/day: 0.30    Years: 25.00    Pack years: 7.50    Types: Cigars    Start date: 09/13/1982    Quit date: 04/2020    Years since quitting: 0.6  . Smokeless tobacco: Never Used  Vaping Use  .  Vaping Use: Never used  Substance and Sexual Activity  . Alcohol use: Not Currently    Alcohol/week: 2.0 - 3.0 standard drinks    Types: 2 - 3 Cans of beer per week  . 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 Sexual activity: Not Currently  Other Topics Concern  . Not on file  Social History Narrative   Lives in [place], [place].   Went to [employer] for 2.5 years.   Social Determinants of Health   Financial Resource Strain: Not on file  Food Insecurity: Not on file  Transportation Needs: Not on file  Physical Activity: Not on file  Stress: Not on file  Social Connections: Not on file   Intimate Partner Violence: Not on file   Family History  Problem Relation Age of Onset  . Cardiomyopathy Brother        Dilated   ROS: All systems reviewed and negative except as per HPI.   Current Outpatient Medications  Medication Sig Dispense Refill  . clonazePAM (KLONOPIN) 0.5 MG tablet Take 0.5 mg by mouth daily as needed for anxiety.    . COLCRYS 0.6 MG tablet Take 0.6 mg by mouth 2 (two) times daily as needed (for gout flare up).     . dapagliflozin propanediol (FARXIGA) 10 MG TABS tablet Take 1 tablet (10 mg total) by mouth daily before breakfast. 30 tablet 6  . digoxin (LANOXIN) 0.125 MG tablet TAKE 1 TABLET BY MOUTH DAILY 90 tablet 0  . furosemide (LASIX) 40 MG tablet Take 40 mg by mouth daily as needed for fluid.     . mirtazapine (REMERON) 15 MG tablet Take 15 mg by mouth at bedtime.    Marland Kitchen nystatin (MYCOSTATIN) 100000 UNIT/ML suspension Take 5 mLs by mouth daily as needed (thrush).    . pramipexole (MIRAPEX) 0.125 MG tablet Take 0.125 mg by mouth 3 (three) times daily as needed (restless leg).     . rivaroxaban (XARELTO) 20 MG TABS tablet Take 20 mg by mouth daily with supper.    Marland Kitchen rOPINIRole (REQUIP) 1 MG tablet Take 1 mg by mouth 3 (three) times daily.    . sacubitril-valsartan (ENTRESTO) 24-26 MG Take 1 tablet by mouth 2 (two) times daily. 60 tablet 3  . simvastatin (ZOCOR) 5 MG tablet Take 5 mg by mouth daily.    Marland Kitchen spironolactone (ALDACTONE) 25 MG tablet Take 1 tablet (25 mg total) by mouth daily. *Patient is overdue for an appointment and needs to call and schedule for further refills* 15 tablet 0  . amiodarone (PACERONE) 200 MG tablet Take 0.5 tablets (100 mg total) by mouth daily. 45 tablet 1   No current facility-administered medications for this encounter.   BP 112/70   Pulse 71   Wt 48.4 kg (106 lb 9.6 oz)   SpO2 96%   BMI 14.46 kg/m  General: NAD, thin Neck: No JVD, no thyromegaly or thyroid nodule.  Lungs: Clear to auscultation bilaterally with normal  respiratory effort. CV: Nondisplaced PMI.  Heart regular S1/S2, no S3/S4, no murmur.  No peripheral edema.  No carotid bruit.  Normal pedal pulses.  Abdomen: Soft, nontender, no hepatosplenomegaly, no distention.  Skin: Intact without lesions or rashes.  Neurologic: Alert and oriented x 3.  Psych: Normal affect. Extremities: No clubbing or cyanosis.  HEENT: Normal.   Assessment/Plan: 1. Chronic systolic CHF: Echo 6/21 with EF <20%, severe LV dilation, normal RV size with  mildly decreased systolic function, IVC normal.  LHC/RHC in 3/20 with no coronary disease, relatively preserved cardiac output.  Nonischemic cardiomyopathy with St Jude ICD.  Probably not CRT candidate, has IVCD but not classic LBBB. RHC in 6/21 showed low filling pressures and preserved cardiac output, but CPX (submaximal) in 6/21 showed severe limitation due to HF as well as severe limitation due to deconditioning. He is not volume overloaded on exam or by [place].  NYHA class III symptoms, better since barostimulator implanted.  - He has not needed Lasix.  - Continue digoxin 0.125 daily, check level today.  - Continue spironolactone 25 mg daily.  - Continue dapagliflozin 10 mg daily.  - With no lightheadedness and generally higher BP, I will stop losartan and start Entresto 24/26 bid.  BMET today and in 10 days.  - Deconditioned and cachectic, needs to be in better physical shape prior to any consideration of advanced therapies.  Luckily, he is feeling better with barostimulation and cardiac output was not low on last RHC.  He has been drinking Ensure.  I would like him to do cardiac rehab but he does not have transportation.  Will repeat CPX in a few months.  2. Smoking/?COPD: Patient has quit smoking.  Restrictive PFTs with 6/21 CPX.  3. H/o VT: He is on amiodarone.  - Check LFTs, TSH today.  He should get a regular eye exam.  - Continue amiodarone 100 mg daily.  4. Atrial fibrillation: Paroxysmal. NSR today.  - Continue  Xarelto. CBC today.  - Continue amiodarone.   Followup 6 wks  Marca Ancona 12/06/2020

## 2020-12-09 ENCOUNTER — Telehealth: Payer: Self-pay | Admitting: Internal Medicine

## 2020-12-09 ENCOUNTER — Other Ambulatory Visit (HOSPITAL_COMMUNITY): Payer: Self-pay

## 2020-12-09 MED ORDER — AMIODARONE HCL 200 MG PO TABS: 100.0000 mg | ORAL_TABLET | Freq: Every day | ORAL | 1 refills | Status: DC

## 2020-12-09 MED ORDER — ENTRESTO 24-26 MG PO TABS: 1.0000 | ORAL_TABLET | Freq: Two times a day (BID) | ORAL | 3 refills | Status: DC

## 2020-12-09 NOTE — Telephone Encounter (Signed)
Patient wanted to know if it was necessary for him to keep his appt on 12/13/20 with Dr. [name]. He said Dr. Shirlee Latch checked out his pacemaker at his last appt and everything looked good. He doesn't drive and would need to find someone to bring him

## 2020-12-09 NOTE — Telephone Encounter (Signed)
Called patient about his upcoming appointment. Patient let me know that he had figured out transportation and looking forward to see Dr. [name] on Friday.

## 2020-12-10 ENCOUNTER — Ambulatory Visit (INDEPENDENT_AMBULATORY_CARE_PROVIDER_SITE_OTHER): Payer: Medicare Other

## 2020-12-10 DIAGNOSIS — Z95810 Presence of automatic (implantable) cardiac defibrillator: Secondary | ICD-10-CM | POA: Diagnosis not present

## 2020-12-10 DIAGNOSIS — I5022 Chronic systolic (congestive) heart failure: Secondary | ICD-10-CM | POA: Diagnosis not present

## 2020-12-12 ENCOUNTER — Telehealth (HOSPITAL_COMMUNITY): Payer: Self-pay | Admitting: *Deleted

## 2020-12-12 ENCOUNTER — Other Ambulatory Visit (HOSPITAL_COMMUNITY): Payer: Self-pay

## 2020-12-12 MED ORDER — AMIODARONE HCL 200 MG PO TABS: 100.0000 mg | ORAL_TABLET | Freq: Every day | ORAL | 1 refills | Status: DC

## 2020-12-12 MED ORDER — AMIODARONE HCL 200 MG PO TABS: 200.0000 mg | ORAL_TABLET | Freq: Every day | ORAL | 1 refills | Status: DC

## 2020-12-12 NOTE — Telephone Encounter (Signed)
Pt called to report he is out of his Amiodarone and is not able to get it refilled from the pharmacy.   I called and spoke w/pharmacy, they state pt picked up a 90 day supply in Nov with directions of 1/2 tab Daily #45 so pt can not get it refilled until Feb.  Per chart Amio was increased to 200 mg Daily in Dec however no new rx was sent in and this is why the patient has now ran out of med  Sent in new RX to pharmacy with directions to take 1 tab (200 mg) Daily so that pt could get the medication refilled, but advise pt to only take 100 mg (1/2 tab) Daily per Dr Shirlee Latch.   Spoke w/pt, he is aware, agreeable, and verbalized understanding. He was very thankful for the assistance and again verbalized he would only take 1/2 tab (100 mg) Daily

## 2020-12-13 ENCOUNTER — Telehealth: Payer: Self-pay

## 2020-12-13 ENCOUNTER — Encounter: Payer: Medicare Other | Admitting: Internal Medicine

## 2020-12-13 NOTE — Progress Notes (Signed)
EPIC Encounter for ICM Monitoring  Patient Name: Chad Christensen is a 55 y.o. male Date: 12/13/2020 Primary Care Physican: Daphine Deutscher Primary Cardiologist:McLean Electrophysiologist: Allred 12/30/2021OfficeWeight: 106lbs.  Attempted call to patient and unable to reach.  Left message to return call. Transmission reviewed.   CorvueThoracic impedancesuggestingnormal fluid levels. Impedance baseline trending higher.  Prescribed:Furosemide 40 mg 1 tablet as needed.  Labs: 06/08/2020 Creatinine0.95, BUN12, Potassium4.6, Sodium134, GFR>60 06/07/2020 Creatinine1.02, BUN11, Potassium4.0, Sodium135, GFR>60 05/10/2020 Creatinine0.97, BUN15, Potassium4.3, Sodium139, GFR>60 A complete set of results can be found in Results Review.  Recommendations: Unable to reach.    Follow-up plan: ICM clinic phone appointment on2/14/2022. 91 day device clinic remote transmission3/06/2021.   EP/Cardiology Office Visits:01/14/2021 with Dr.McLean.  04/11/2021 with Dr [name].  Copy of ICM check sent to Dr.Allred  3 month ICM trend: 12/10/2020.    1 Year ICM trend:       Karie Soda, RN 12/13/2020 3:16 PM

## 2020-12-13 NOTE — Telephone Encounter (Signed)
Remote ICM transmission received.  Attempted call to patient regarding ICM remote transmission and left message to return call   

## 2020-12-26 ENCOUNTER — Other Ambulatory Visit: Payer: Self-pay | Admitting: Internal Medicine

## 2020-12-28 ENCOUNTER — Other Ambulatory Visit: Payer: Self-pay | Admitting: Internal Medicine

## 2020-12-30 ENCOUNTER — Other Ambulatory Visit (HOSPITAL_COMMUNITY): Payer: Self-pay

## 2020-12-30 MED ORDER — DAPAGLIFLOZIN PROPANEDIOL 10 MG PO TABS: 10.0000 mg | ORAL_TABLET | Freq: Every day | ORAL | 3 refills | Status: DC

## 2021-01-01 ENCOUNTER — Other Ambulatory Visit (HOSPITAL_COMMUNITY): Payer: Self-pay

## 2021-01-01 MED ORDER — DAPAGLIFLOZIN PROPANEDIOL 10 MG PO TABS: 10.0000 mg | ORAL_TABLET | Freq: Every day | ORAL | 3 refills | Status: AC

## 2021-01-04 ENCOUNTER — Other Ambulatory Visit: Payer: Self-pay | Admitting: Internal Medicine

## 2021-01-07 ENCOUNTER — Telehealth (HOSPITAL_COMMUNITY): Payer: Self-pay | Admitting: Licensed Clinical Social Worker

## 2021-01-07 NOTE — Telephone Encounter (Signed)
Pt has appt 2/8 with Dr. Shirlee Latch- CSW set up [facility] to bring pt to appt.  Will continue to follow and assist as needed  Burna Sis, LCSW Clinical Social Worker [facility] Desk#: [phone] Cell#: [phone]

## 2021-01-13 ENCOUNTER — Telehealth (HOSPITAL_COMMUNITY): Payer: Self-pay | Admitting: Licensed Clinical Social Worker

## 2021-01-13 NOTE — Telephone Encounter (Signed)
CSW informed that transport unable to bring pt to appt tomorrow due to scheduling conflict with their other rides.  CSW called transport and they were unable to accommodate- suggest scheduling pt for appts at 8:30am or after 2pm in the future to prevent scheduling conflicts as mornings tend to be more crowded.    Clinic scheduler helped to rearrange appt for next week at 2pm.  CSW sent in another transport request and informed research of new appt time so they can reschedule as well.  Pt informed of new appt time and date- no conflicts.  Will continue to follow and assist as needed  Burna Sis, LCSW Clinical Social Worker [facility] Desk#: [phone] Cell#: [phone]

## 2021-01-14 ENCOUNTER — Encounter (HOSPITAL_COMMUNITY): Payer: Medicare Other | Admitting: Cardiology

## 2021-01-17 ENCOUNTER — Telehealth: Payer: Self-pay | Admitting: Emergency Medicine

## 2021-01-17 NOTE — Telephone Encounter (Signed)
Received alert that St Jude ICD has reached ERI on 01/16/21. Patient made aware he will be scheduled for appointment to discuss gen change with Dr [name] and procedure will not be able to wait until 06/2021. Patient needs  transportation arranged for appointment.

## 2021-01-20 ENCOUNTER — Ambulatory Visit (INDEPENDENT_AMBULATORY_CARE_PROVIDER_SITE_OTHER): Payer: Medicare Other

## 2021-01-20 DIAGNOSIS — Z95810 Presence of automatic (implantable) cardiac defibrillator: Secondary | ICD-10-CM

## 2021-01-20 DIAGNOSIS — I5022 Chronic systolic (congestive) heart failure: Secondary | ICD-10-CM | POA: Diagnosis not present

## 2021-01-21 ENCOUNTER — Encounter: Payer: Medicare Other | Admitting: *Deleted

## 2021-01-21 ENCOUNTER — Other Ambulatory Visit: Payer: Self-pay

## 2021-01-21 ENCOUNTER — Ambulatory Visit (HOSPITAL_COMMUNITY)
Admission: RE | Admit: 2021-01-21 | Discharge: 2021-01-21 | Disposition: A | Discharge status: Home / Self Care | Payer: Medicare Other | Source: Ambulatory Visit | Attending: Cardiology | Admitting: Cardiology

## 2021-01-21 ENCOUNTER — Encounter (HOSPITAL_COMMUNITY): Payer: Self-pay | Admitting: Cardiology

## 2021-01-21 VITALS — BP 111/68 | HR 75 | Wt 102.1 lb

## 2021-01-21 VITALS — BP 110/60 | HR 78 | Wt 102.8 lb

## 2021-01-21 DIAGNOSIS — Z006 Encounter for examination for normal comparison and control in clinical research program: Secondary | ICD-10-CM

## 2021-01-21 DIAGNOSIS — I480 Paroxysmal atrial fibrillation: Secondary | ICD-10-CM | POA: Diagnosis not present

## 2021-01-21 DIAGNOSIS — Z79899 Other long term (current) drug therapy: Secondary | ICD-10-CM | POA: Diagnosis not present

## 2021-01-21 DIAGNOSIS — Z95810 Presence of automatic (implantable) cardiac defibrillator: Secondary | ICD-10-CM | POA: Diagnosis not present

## 2021-01-21 DIAGNOSIS — I110 Hypertensive heart disease with heart failure: Secondary | ICD-10-CM | POA: Diagnosis not present

## 2021-01-21 DIAGNOSIS — Z7901 Long term (current) use of anticoagulants: Secondary | ICD-10-CM | POA: Insufficient documentation

## 2021-01-21 DIAGNOSIS — Z8673 Personal history of transient ischemic attack (TIA), and cerebral infarction without residual deficits: Secondary | ICD-10-CM | POA: Diagnosis not present

## 2021-01-21 DIAGNOSIS — I5022 Chronic systolic (congestive) heart failure: Secondary | ICD-10-CM | POA: Insufficient documentation

## 2021-01-21 DIAGNOSIS — Z87891 Personal history of nicotine dependence: Secondary | ICD-10-CM | POA: Insufficient documentation

## 2021-01-21 DIAGNOSIS — I428 Other cardiomyopathies: Secondary | ICD-10-CM | POA: Diagnosis not present

## 2021-01-21 LAB — CBC
HCT: 42.1 % (ref 39.0–52.0)
Hemoglobin: 14.7 g/dL (ref 13.0–17.0)
MCH: 33.9 pg (ref 26.0–34.0)
MCHC: 34.9 g/dL (ref 30.0–36.0)
MCV: 97.2 fL (ref 80.0–100.0)
Platelets: 226 10*3/uL (ref 150–400)
RBC: 4.33 MIL/uL (ref 4.22–5.81)
RDW: 12.9 % (ref 11.5–15.5)
WBC: 8.8 10*3/uL (ref 4.0–10.5)
nRBC: 0 % (ref 0.0–0.2)

## 2021-01-21 LAB — COMPREHENSIVE METABOLIC PANEL
ALT: 25 U/L (ref 0–44)
AST: 17 U/L (ref 15–41)
Albumin: 4.3 g/dL (ref 3.5–5.0)
Alkaline Phosphatase: 36 U/L — ABNORMAL LOW (ref 38–126)
Anion gap: 12 (ref 5–15)
BUN: 11 mg/dL (ref 6–20)
CO2: 24 mmol/L (ref 22–32)
Calcium: 9.6 mg/dL (ref 8.9–10.3)
Chloride: 103 mmol/L (ref 98–111)
Creatinine, Ser: 0.86 mg/dL (ref 0.61–1.24)
GFR, Estimated: >60 mL/min (ref >60)
Glucose, Bld: 98 mg/dL (ref 70–99)
Potassium: 4 mmol/L (ref 3.5–5.1)
Sodium: 139 mmol/L (ref 135–145)
Total Bilirubin: 1 mg/dL (ref 0.3–1.2)
Total Protein: 7.2 g/dL (ref 6.5–8.1)

## 2021-01-21 LAB — TSH: TSH: 2.986 u[IU]/mL (ref 0.350–4.500)

## 2021-01-21 LAB — BRAIN NATRIURETIC PEPTIDE: B Natriuretic Peptide: 368.1 pg/mL — ABNORMAL HIGH (ref 0.0–100.0)

## 2021-01-21 MED ORDER — ENTRESTO 49-51 MG PO TABS: 1.0000 | ORAL_TABLET | Freq: Two times a day (BID) | ORAL | 3 refills | Status: DC

## 2021-01-21 NOTE — Patient Instructions (Addendum)
Labs done today. We will contact you only if your labs are abnormal.   INCREASE Entresto 49-51mg  (1 tablet) by mouth 2 times daily.  No other medication changes were made. Please continue all current medications as prescribed.  Your physician has recommended that you have a cardiopulmonary stress test (CPX). CPX testing is a non-invasive measurement of heart and lung function. It replaces a traditional treadmill stress test. This type of test provides a tremendous amount of information that relates not only to your present condition but also for future outcomes. This test combines measurements of you ventilation, respiratory gas exchange in the lungs, electrocardiogram (EKG), blood pressure and physical response before, during, and following an exercise protocol.  Your physician recommends that you schedule a follow-up appointment in: 10 day lab appointment and in 2 months with Dr. Shirlee Latch  If you have any questions or concerns before your next appointment please send [place] a message through [facility] or call our office at [phone].    TO LEAVE A MESSAGE FOR THE NURSE SELECT OPTION 2, PLEASE LEAVE A MESSAGE INCLUDING: . YOUR NAME . DATE OF BIRTH . CALL BACK NUMBER . REASON FOR CALL**this is important as we prioritize the call backs  YOU WILL RECEIVE A CALL BACK THE SAME DAY AS LONG AS YOU CALL BEFORE 4:00 PM   Do the following things EVERYDAY: 1) Weigh yourself in the morning before breakfast. Write it down and keep it in a log. 2) Take your medicines as prescribed 3) Eat low salt foods--Limit salt (sodium) to 2000 mg per day.  4) Stay as active as you can everyday 5) Limit all fluids for the day to less than 2 liters   At the [facility], you and your health needs are our priority. As part of our continuing mission to provide you with exceptional heart care, we have created designated Provider Care Teams. These Care Teams include your primary Cardiologist (physician) and  Advanced Practice Providers (APPs- Physician Assistants and Nurse Practitioners) who all work together to provide you with the care you need, when you need it.   You may see any of the following providers on your designated Care Team at your next follow up: Marland Kitchen Dr Arvilla Meres . Dr Marca Ancona . Tonye Becket, NP . Robbie Lis, PA . Karle Plumber, PharmD   Please be sure to bring in all your medications bottles to every appointment.

## 2021-01-21 NOTE — Progress Notes (Signed)
PCP: Betti Cruz, PA-C EP: Dr. [name] Cardiology: Dr. Shirlee Latch  55 y.o. with history of nonischemic dilated cardiomyopathy and paroxysmal atrial fibrillation presents for followup of CHF.  Patient lives in [place], [place].  He gets sporadic cardiology care as it is hard for him to get a ride to [place] and he does not want to see any of the cardiologists in [place]. He is followed by Dr. [name] for his [facility] ICD.  I saw him initially in 3/19, when he was referred by Dr. [name] for left/right heart cath.  This showed no coronary disease and low but not markedly low cardiac output (CI 2.22).  Echo in 3/19 showed a severely dilated LV with EF 20%.    Echo in 6/21 showed EF <20%, severe LV dilation, normal RV size with mildly decreased systolic function, IVC normal.  RHC was done in 6/21, showing low filling pressures and CI 2.38 by Fick/2.68 by Thermo.  CPX in 6/21 showed both severe deconditioning and severe HF limitation.  In 7/21, he had barostimulation activation therapy placed. Echo in 12/21 showed EF <20%, mild LV dilation, normal RV size and systolic function, IVC normal.   He returns for followup of CHF. He walks with a cane chronically due to arthritis (neck and back pain, has had back surgery) and poor balance with prior CVA.  He has quit smoking.  He walks slowly.  He still fatigues quickly with walking, says leg strength is poor.  He uses stationary bike for 6 minutes at a time. He walks in the house with cane and outside with walker. No dyspnea but fatigues easily.  No chest pain. He says that he does not feel depressed.  Weight is down 4 lbs.  He is drinking Ensure 2-4 times daily. He stays busy helping to manage apartments. He feels occasional palpitations, no lightheadedness.   St Jude device interrogation: Stable thoracic impedance, no VT.   ECG (personally reviewed): NSR, right superior axis, LVH  Labs (1/20): digoxin 0.6, K 4.4, creatinine 0.76 Labs (6/20): digoxin 0.6, K  4.9, creatinine 1.05 Labs (6/21): LFTs normal Labs (7/21): K 4.6, creatinine 0.95 => 1.04 Labs (10/21): digoxin 0.9 Labs (11/21): K 4.2, creatinine 0.88 Labs (12/21): K 4.1, creatinine 0.94, TSH normal, digoxin 1.0, LFTs normal  PMH: 1. H/o VT 2. H/o GI bleeding 3. Colon polyps 4. Remote substance abuse. 5. HTN 6. Gout 7. Atrial fibrillation: Paroxysmal.  8. ?COPD: Smokes cigars.  9. Chronic systolic CHF: Nonischemic cardiomyopathy.  St Jude ICD.  - Echo (3/19): EF 20%, severe LV dilation, diffuse hypokinesis, mild-moderate MR, low normal RV function.  - LHC/RHC (3/19): No CAD; mean RA 2, PA 24/8, mean PCWP 13, CI 2.2.  - Echo (6/21): EF <20%, severe LV dilation, normal RV size with mildly decreased systolic function, IVC normal.  - RHC (6/21): mean RA 1, PA 16/3, mean PCWP 3, CI 2.38 Fick/2.68 thermo.   - CPX (6/21): peak VO2 10.7, RER 0.85 (submaximal), VE/VCO2 slope 80 => severe deconditioning and severe HF limitation.  PFTs were restrictive.  - Barostimulator implantation (7/21).  - Echo (12/21): EF <20%, mild LV dilation, normal RV size and systolic function, IVC normal.  10. Prior CVA: Likely related to atrial fibrillation.  - Carotid dopplers (12/21): Normal.  11. Back surgery.   Social History   Socioeconomic History  . Marital status: Single    Spouse name: Not on file  . Number of children: Not on file  . Years of education: Not  on file  . Highest education level: Not on file  Occupational History  . Occupation: Disability  Tobacco Use  . Smoking status: Former Smoker    Packs/day: 0.30    Years: 25.00    Pack years: 7.50    Types: Cigars    Start date: 09/13/1982    Quit date: 04/2020    Years since quitting: 0.7  . Smokeless tobacco: Never Used  Vaping Use  . Vaping Use: Never used  Substance and Sexual Activity  . Alcohol use: Not Currently    Alcohol/week: 2.0 - 3.0 standard drinks    Types: 2 - 3 Cans of beer per week  . Drug use: Yes    Types:  Marijuana    Comment: reports that he is in remission  . Sexual activity: Not Currently  Other Topics Concern  . Not on file  Social History Narrative   Lives in [place], [place].   Went to [employer] for 2.5 years.   Social Determinants of Health   Financial Resource Strain: Not on file  Food Insecurity: Not on file  Transportation Needs: Not on file  Physical Activity: Not on file  Stress: Not on file  Social Connections: Not on file  Intimate Partner Violence: Not on file   Family History  Problem Relation Age of Onset  . Cardiomyopathy Brother        Dilated   ROS: All systems reviewed and negative except as per HPI.   Current Outpatient Medications  Medication Sig Dispense Refill  . amiodarone (PACERONE) 200 MG tablet Take 200 mg by mouth daily.    . clonazePAM (KLONOPIN) 0.5 MG tablet Take 0.5 mg by mouth daily as needed for anxiety.    . COLCRYS 0.6 MG tablet Take 0.6 mg by mouth 2 (two) times daily as needed (for gout flare up).     . dapagliflozin propanediol (FARXIGA) 10 MG TABS tablet Take 1 tablet (10 mg total) by mouth daily before breakfast. 90 tablet 3  . digoxin (LANOXIN) 0.125 MG tablet TAKE 1 TABLET BY MOUTH DAILY 90 tablet 0  . furosemide (LASIX) 40 MG tablet Take 40 mg by mouth daily as needed for fluid.     . mirtazapine (REMERON) 15 MG tablet Take 15 mg by mouth at bedtime.    Marland Kitchen nystatin (MYCOSTATIN) 100000 UNIT/ML suspension Take 5 mLs by mouth daily as needed (thrush).    . pramipexole (MIRAPEX) 0.125 MG tablet Take 0.125 mg by mouth 3 (three) times daily as needed (restless leg).     . rivaroxaban (XARELTO) 20 MG TABS tablet Take 20 mg by mouth daily with supper.    . sacubitril-valsartan (ENTRESTO) 49-51 MG Take 1 tablet by mouth 2 (two) times daily. 180 tablet 3  . simvastatin (ZOCOR) 5 MG tablet Take 5 mg by mouth daily.    Marland Kitchen spironolactone (ALDACTONE) 25 MG tablet Take 1 tablet (25 mg total) by mouth daily. *Patient is overdue for an appointment and needs  to call and schedule for further refills* 15 tablet 0   No current facility-administered medications for this encounter.   BP 110/60   Pulse 78   Wt 46.6 kg (102 lb 12.8 oz)   SpO2 99%   BMI 13.94 kg/m  General: NAD, thin Neck: No JVD, no thyromegaly or thyroid nodule.  Lungs: Clear to auscultation bilaterally with normal respiratory effort. CV: Nondisplaced PMI.  Heart regular S1/S2, no S3/S4, no murmur.  No peripheral edema.  No carotid bruit.  Normal  pedal pulses.  Abdomen: Soft, nontender, no hepatosplenomegaly, no distention.  Skin: Intact without lesions or rashes.  Neurologic: Alert and oriented x 3.  Psych: Normal affect. Extremities: No clubbing or cyanosis.  HEENT: Normal.   Assessment/Plan: 1. Chronic systolic CHF: Echo 6/21 with EF <20%, severe LV dilation, normal RV size with mildly decreased systolic function, IVC normal.  LHC/RHC in 3/20 with no coronary disease, relatively preserved cardiac output.  Nonischemic cardiomyopathy with St Jude ICD.  Probably not CRT candidate, has IVCD but not classic LBBB. RHC in 6/21 showed low filling pressures and preserved cardiac output, but CPX (submaximal) in 6/21 showed severe limitation due to HF as well as severe limitation due to deconditioning. He is now s/p barostimulation activation therapy.  Echo in 12/21 showed EF still < 20% with normal RV.  He is not volume overloaded on exam or by Corvue.  NYHA class III symptoms, still with a lot of fatigue.   - He has not needed Lasix.  - Continue digoxin 0.125 daily, check level today.  - Continue spironolactone 25 mg daily.  - Continue dapagliflozin 10 mg daily.  - Increase Entresto to 49/51 bid, BMET 10 days in 10 days.  - Deconditioned and cachectic, needs to be in better physical shape prior to any consideration of advanced therapies.  Cardiac output was not low on last RHC.  He has been drinking Ensure.  I would like him to do cardiac rehab but he does not have transportation. I will  try to get home PT for him.  - I will repeat a CPX now that he has barostimulation activation therapy.  2. Smoking/?COPD: Patient has quit smoking.  Restrictive PFTs with 6/21 CPX.  3. H/o VT: He is on amiodarone.  - Check LFTs, TSH today.  He should get a regular eye exam.  - Continue amiodarone 100 mg daily.  - He needs ICD generator change soon.  4. Atrial fibrillation: Paroxysmal. NSR today.  - Continue Xarelto. CBC today.  - Continue amiodarone.   Followup 2 months.   Marca Ancona 01/21/2021

## 2021-01-22 ENCOUNTER — Telehealth: Payer: Self-pay | Admitting: Internal Medicine

## 2021-01-22 NOTE — Telephone Encounter (Signed)
Pt is returning call to someone who called about changing the battery in his device.   Please let phone ring more than 3 times, he has a hard time getting to the phone   [phone]

## 2021-01-23 ENCOUNTER — Other Ambulatory Visit (HOSPITAL_COMMUNITY): Payer: Self-pay | Admitting: *Deleted

## 2021-01-23 DIAGNOSIS — I5022 Chronic systolic (congestive) heart failure: Secondary | ICD-10-CM

## 2021-01-24 NOTE — Progress Notes (Signed)
EPIC Encounter for ICM Monitoring  Patient Name: Chad Christensen is a 55 y.o. male Date: 01/24/2021 Primary Care Physican: Daphine Deutscher Primary Cardiologist:McLean Electrophysiologist: Allred 01/21/2021 OfficeWeight: 102lbs.  Device reached ERI 01/16/21  Transmission reviewed.   CorvueThoracic impedancesuggestingnormal fluid levels. Impedance baseline trending higher. Patient has Barostim.  Prescribed:Furosemide 40 mg 1 tablet as needed.  Labs: 01/21/2021 Creatinine 0.86, BUN 11, Potassium 4.0, Sodium 139, GFR >60  12/05/2020 Creatinine 0.94, BUN 16, Potassium 4.1, Sodium 138, GFR >60  10/09/2020 Creatinine 0.88, BUN 13, Potassium 4.2, Sodium 139  A complete set of results can be found in Results Review.  Recommendations: Recommendations given at 01/21/2021 OV with Dr Shirlee Latch.  Follow-up plan: ICM clinic phone appointment on3/21/2022. 91 day device clinic remote transmission3/06/2021.   EP/Cardiology Office Visits:03/21/2021 with Dr.McLean.  02/05/2021 with Dr [name] to discuss battery replacement.  Copy of ICM check sent to Dr.Allred  3 month ICM trend: 01/21/2021.    1 Year ICM trend:       Karie Soda, RN 01/24/2021 1:12 PM

## 2021-01-27 ENCOUNTER — Telehealth: Payer: Self-pay | Admitting: *Deleted

## 2021-01-27 DIAGNOSIS — I5022 Chronic systolic (congestive) heart failure: Secondary | ICD-10-CM

## 2021-01-28 NOTE — Progress Notes (Signed)
Home physical therapy order faxed to [facility] they are unable to service his area.  Order faxed to [facility].

## 2021-01-30 ENCOUNTER — Telehealth (HOSPITAL_COMMUNITY): Payer: Self-pay

## 2021-01-30 ENCOUNTER — Telehealth (HOSPITAL_COMMUNITY): Payer: Self-pay | Admitting: Licensed Clinical Social Worker

## 2021-01-30 NOTE — Telephone Encounter (Signed)
error 

## 2021-01-30 NOTE — Telephone Encounter (Signed)
CSW called [facility] for pt lab tomorrow.  Confirmed he is already set up with transportation to 3/2 appt.  CSW also scheduled transport for 3/8 CPX appt.  Will continue to follow and assist as needed  Burna Sis, LCSW Clinical Social Worker [facility] Desk#: [phone] Cell#: [phone]

## 2021-01-31 ENCOUNTER — Ambulatory Visit (HOSPITAL_COMMUNITY)
Admission: RE | Admit: 2021-01-31 | Discharge: 2021-01-31 | Disposition: A | Discharge status: Home / Self Care | Payer: Medicare Other | Source: Ambulatory Visit | Attending: Cardiology | Admitting: Cardiology

## 2021-01-31 ENCOUNTER — Other Ambulatory Visit: Payer: Self-pay

## 2021-01-31 DIAGNOSIS — I5022 Chronic systolic (congestive) heart failure: Secondary | ICD-10-CM | POA: Diagnosis present

## 2021-01-31 LAB — BASIC METABOLIC PANEL
Anion gap: 11 (ref 5–15)
BUN: 9 mg/dL (ref 6–20)
CO2: 26 mmol/L (ref 22–32)
Calcium: 9.7 mg/dL (ref 8.9–10.3)
Chloride: 103 mmol/L (ref 98–111)
Creatinine, Ser: 1.05 mg/dL (ref 0.61–1.24)
GFR, Estimated: >60 mL/min (ref >60)
Glucose, Bld: 92 mg/dL (ref 70–99)
Potassium: 4.4 mmol/L (ref 3.5–5.1)
Sodium: 140 mmol/L (ref 135–145)

## 2021-02-05 ENCOUNTER — Ambulatory Visit (INDEPENDENT_AMBULATORY_CARE_PROVIDER_SITE_OTHER): Payer: Medicare Other | Admitting: Internal Medicine

## 2021-02-05 ENCOUNTER — Encounter: Payer: Self-pay | Admitting: *Deleted

## 2021-02-05 ENCOUNTER — Other Ambulatory Visit: Payer: Self-pay

## 2021-02-05 ENCOUNTER — Encounter: Payer: Self-pay | Admitting: Internal Medicine

## 2021-02-05 VITALS — BP 90/56 | HR 74 | Ht 72.0 in | Wt 99.8 lb

## 2021-02-05 DIAGNOSIS — I480 Paroxysmal atrial fibrillation: Secondary | ICD-10-CM

## 2021-02-05 DIAGNOSIS — I428 Other cardiomyopathies: Secondary | ICD-10-CM

## 2021-02-05 DIAGNOSIS — I10 Essential (primary) hypertension: Secondary | ICD-10-CM | POA: Diagnosis not present

## 2021-02-05 DIAGNOSIS — I5022 Chronic systolic (congestive) heart failure: Secondary | ICD-10-CM

## 2021-02-05 DIAGNOSIS — I472 Ventricular tachycardia, unspecified: Secondary | ICD-10-CM

## 2021-02-05 NOTE — Patient Instructions (Addendum)
Medication Instructions:  Your physician recommends that you continue on your current medications as directed. Please refer to the Current Medication list given to you today.  Labwork: CBC, BMP   Testing/Procedures: Your physician has recommended that you have a defibrillator battery change. An implantable cardioverter defibrillator (ICD) is a small device that is placed in your chest or, in rare cases, your abdomen. This device uses electrical pulses or shocks to help control life-threatening, irregular heartbeats that could lead the heart to suddenly stop beating (sudden cardiac arrest). Leads are attached to the ICD that goes into your heart. This is done in the hospital and usually requires an overnight stay. Please see the instruction sheet given to you today for more information.   Remote monitoring is used to monitor your ICD from home. This monitoring reduces the number of office visits required to check your device to one time per year. It allows [place] to keep an eye on the functioning of your device to ensure it is working properly. You are scheduled for a device check from home on 02/10/21. You may send your transmission at any time that day. If you have a wireless device, the transmission will be sent automatically. After your physician reviews your transmission, you will receive a postcard with your next transmission date.  Any Other Special Instructions Will Be Listed Below (If Applicable).  If you need a refill on your cardiac medications before your next appointment, please call your pharmacy.

## 2021-02-05 NOTE — Progress Notes (Signed)
 PCP: Eure, Courtney H, PA-C Primary Cardiologist: Dr McLean Primary EP: Dr Larrie Fraizer  Chad Christensen is a 55 y.o. male who presents today for routine electrophysiology followup.  Since last being seen in our clinic, the patient reports doing very well.  Today, he denies symptoms of palpitations, chest pain, shortness of breath,  lower extremity edema, dizziness, presyncope, syncope, or ICD shocks.  The patient is otherwise without complaint today.   Past Medical History:  Diagnosis Date  . AICD (automatic cardioverter/defibrillator) present   . CHF (congestive heart failure) (HCC) 05/1999  . COPD (chronic obstructive pulmonary disease) (HCC) 2019  . Gout   . Hypertension   . Paroxysmal atrial fibrillation (HCC)   . Polysubstance abuse (HCC)    In remission since 2003  . Renal insufficiency   . Stroke (HCC) 2018  . Systolic heart failure    Secondary to nonischemic cardiomyopathy  . Tobacco user   . Ventricular tachycardia (HCC) 04/23/12   CL 250msec with appropriate ICD shock delivered   Past Surgical History:  Procedure Laterality Date  . BAROREFLEX LEAD INSERTION N/A 06/07/2020   Procedure: BAROREFLEX LEAD INSERTION;  Surgeon: Remas Sobel, MD;  Location: [facility];  Service: Cardiovascular;  Laterality: N/A;  . Baroreflexive Activation Therapy Device Right 06/07/2020   [employer] Barostim Neo device model 2102 (SN [id number]) implanted as part of [employer] protocol by Drs Brabham and Shafiq Larch  . CARDIAC DEFIBRILLATOR PLACEMENT     SJM ICD implanted by [facility] 08/15/10  . CERVICAL SPINE SURGERY    . CORONARY ANGIOPLASTY    . HERNIA REPAIR     Bilateral  . LUMBAR SPINE SURGERY    . RIGHT HEART CATH N/A 05/15/2020   Procedure: RIGHT HEART CATH;  Surgeon: McLean, Dalton S, MD;  Location: [facility];  Service: Cardiovascular;  Laterality: N/A;  . RIGHT/LEFT HEART CATH AND CORONARY ANGIOGRAPHY N/A 03/03/2018   Procedure: RIGHT/LEFT HEART CATH AND CORONARY ANGIOGRAPHY;   Surgeon: McLean, Dalton S, MD;  Location: [facility];  Service: Cardiovascular;  Laterality: N/A;  . THORACOTOMY  9/09   Left; for rib fracture and associated pneumothorax    ROS- all systems are reviewed and negative except as per HPI above  Current Outpatient Medications  Medication Sig Dispense Refill  . amiodarone (PACERONE) 200 MG tablet Take 200 mg by mouth daily.    . clonazePAM (KLONOPIN) 0.5 MG tablet Take 0.5 mg by mouth daily as needed for anxiety.    . COLCRYS 0.6 MG tablet Take 0.6 mg by mouth 2 (two) times daily as needed (for gout flare up).     . dapagliflozin propanediol (FARXIGA) 10 MG TABS tablet Take 1 tablet (10 mg total) by mouth daily before breakfast. 90 tablet 3  . digoxin (LANOXIN) 0.125 MG tablet TAKE 1 TABLET BY MOUTH DAILY 90 tablet 0  . furosemide (LASIX) 40 MG tablet Take 40 mg by mouth daily as needed for fluid.     . mirtazapine (REMERON) 15 MG tablet Take 15 mg by mouth at bedtime.    . nystatin (MYCOSTATIN) 100000 UNIT/ML suspension Take 5 mLs by mouth daily as needed (thrush).    . pramipexole (MIRAPEX) 0.125 MG tablet Take 0.125 mg by mouth 3 (three) times daily as needed (restless leg).     . rivaroxaban (XARELTO) 20 MG TABS tablet Take 20 mg by mouth daily with supper.    . sacubitril-valsartan (ENTRESTO) 49-51 MG Take 1 tablet by mouth 2 (two)   times daily. 180 tablet 3  . simvastatin (ZOCOR) 5 MG tablet Take 5 mg by mouth daily.    Marland Kitchen spironolactone (ALDACTONE) 25 MG tablet Take 1 tablet (25 mg total) by mouth daily. *Patient is overdue for an appointment and needs to call and schedule for further refills* 15 tablet 0   No current facility-administered medications for this visit.    Physical Exam: Vitals:   02/05/21 1612  BP: (!) 90/56  Pulse: 74  SpO2: 95%  Weight: 99 lb 12.8 oz (45.3 kg)  Height: 6' (1.829 m)    GEN- The patient is cachetic appearing, alert and oriented x 3 today.   Head- normocephalic, atraumatic Eyes-  Sclera  clear, conjunctiva pink Ears- hearing intact Oropharynx- clear Lungs- Clear to ausculation bilaterally, normal work of breathing Chest- ICD pocket is well healed Heart- Regular rate and rhythm, no murmurs, rubs or gallops, PMI not laterally displaced GI- soft, NT, ND, + BS Extremities- no clubbing, cyanosis, or edema  ICD interrogation- reviewed in detail today,  See PACEART report  ekg tracing ordered today is personally reviewed and shows sinus, IVCD (QRS 130 msec)  Wt Readings from Last 3 Encounters:  02/05/21 99 lb 12.8 oz (45.3 kg)  01/21/21 102 lb 12.8 oz (46.6 kg)  12/05/20 106 lb (48.1 kg)    Assessment and Plan:  1.  Chronic systolic dysfunction/ nonischemic CM/ prior VT/VF euvolemic today Stable on an appropriate medical regimen Normal ICD function See Pace Art report No changes today he is not device dependant today He is at [facility] Arrhythmias are stable on amiodarone. We will need to follow him closely on this medicine to avoid toxicity. Risks, benefits, and alternatives to ICD pulse generator replacement were discussed in detail today.  The patient understands that risks include but are not limited to bleeding, infection, pneumothorax, perforation, tamponade, vascular damage, renal failure, MI, stroke, death, inappropriate shocks, damage to his existing leads, and lead dislodgement and wishes to proceed.  We will therefore schedule the procedure at the next available time. He has a IVCD with QRS 130 msec.  I would not therefore advise CRT.  He has responded well to BAT therapy.  2. HTN Stable No change required today  3. afib Well controlled H/o stroke He is on xarelto Hold xarelto 24 hours prior to ICD generator change  Risks, benefits and potential toxicities for medications prescribed and/or refilled reviewed with patient today.   Hillis Range MD, [facility] 02/05/2021 4:25 PM

## 2021-02-05 NOTE — H&P (View-Only) (Signed)
PCP: Betti Cruz, PA-C Primary Cardiologist: Dr Shirlee Latch Primary EP: Dr [name]  Chad Christensen is a 55 y.o. male who presents today for routine electrophysiology followup.  Since last being seen in our clinic, the patient reports doing very well.  Today, he denies symptoms of palpitations, chest pain, shortness of breath,  lower extremity edema, dizziness, presyncope, syncope, or ICD shocks.  The patient is otherwise without complaint today.   Past Medical History:  Diagnosis Date  . AICD (automatic cardioverter/defibrillator) present   . CHF (congestive heart failure) (HCC) 05/1999  . COPD (chronic obstructive pulmonary disease) (HCC) 2019  . Gout   . Hypertension   . Paroxysmal atrial fibrillation (HCC)   . Polysubstance abuse (HCC)    In remission since 2003  . Renal insufficiency   . Stroke (HCC) 2018  . Systolic heart failure    Secondary to nonischemic cardiomyopathy  . Tobacco user   . Ventricular tachycardia (HCC) 04/23/12   CL with appropriate ICD shock delivered   Past Surgical History:  Procedure Laterality Date  . BAROREFLEX LEAD INSERTION N/A 06/07/2020   Procedure: BAROREFLEX LEAD INSERTION;  Surgeon: Hillis Range, MD;  Location: [facility];  Service: Cardiovascular;  Laterality: N/A;  . Baroreflexive Activation Therapy Device Right 06/07/2020   [employer] Barostim Neo device model 2102 (SN [id number]) implanted as part of [employer] protocol by Drs Myra Gianotti and Jasmin Trumbull  . CARDIAC DEFIBRILLATOR PLACEMENT     SJM ICD implanted by [facility] 08/15/10  . CERVICAL SPINE SURGERY    . CORONARY ANGIOPLASTY    . HERNIA REPAIR     Bilateral  . LUMBAR SPINE SURGERY    . RIGHT HEART CATH N/A 05/15/2020   Procedure: RIGHT HEART CATH;  Surgeon: Laurey Morale, MD;  Location: [facility] INVASIVE CV LAB;  Service: Cardiovascular;  Laterality: N/A;  . RIGHT/LEFT HEART CATH AND CORONARY ANGIOGRAPHY N/A 03/03/2018   Procedure: RIGHT/LEFT HEART CATH AND CORONARY ANGIOGRAPHY;   Surgeon: Laurey Morale, MD;  Location: [facility] INVASIVE CV LAB;  Service: Cardiovascular;  Laterality: N/A;  . THORACOTOMY  9/09   Left; for rib fracture and associated pneumothorax    ROS- all systems are reviewed and negative except as per HPI above  Current Outpatient Medications  Medication Sig Dispense Refill  . amiodarone (PACERONE) 200 MG tablet Take 200 mg by mouth daily.    . clonazePAM (KLONOPIN) 0.5 MG tablet Take 0.5 mg by mouth daily as needed for anxiety.    . COLCRYS 0.6 MG tablet Take 0.6 mg by mouth 2 (two) times daily as needed (for gout flare up).     . dapagliflozin propanediol (FARXIGA) 10 MG TABS tablet Take 1 tablet (10 mg total) by mouth daily before breakfast. 90 tablet 3  . digoxin (LANOXIN) 0.125 MG tablet TAKE 1 TABLET BY MOUTH DAILY 90 tablet 0  . furosemide (LASIX) 40 MG tablet Take 40 mg by mouth daily as needed for fluid.     . mirtazapine (REMERON) 15 MG tablet Take 15 mg by mouth at bedtime.    Marland Kitchen nystatin (MYCOSTATIN) 100000 UNIT/ML suspension Take 5 mLs by mouth daily as needed (thrush).    . pramipexole (MIRAPEX) 0.125 MG tablet Take 0.125 mg by mouth 3 (three) times daily as needed (restless leg).     . rivaroxaban (XARELTO) 20 MG TABS tablet Take 20 mg by mouth daily with supper.    . sacubitril-valsartan (ENTRESTO) 49-51 MG Take 1 tablet by mouth 2 (two)  times daily. 180 tablet 3  . simvastatin (ZOCOR) 5 MG tablet Take 5 mg by mouth daily.    Marland Kitchen spironolactone (ALDACTONE) 25 MG tablet Take 1 tablet (25 mg total) by mouth daily. *Patient is overdue for an appointment and needs to call and schedule for further refills* 15 tablet 0   No current facility-administered medications for this visit.    Physical Exam: Vitals:   02/05/21 1612  BP: (!) 90/56  Pulse: 74  SpO2: 95%  Weight: 99 lb 12.8 oz (45.3 kg)  Height: 6' (1.829 m)    GEN- The patient is cachetic appearing, alert and oriented x 3 today.   Head- normocephalic, atraumatic Eyes-  Sclera  clear, conjunctiva pink Ears- hearing intact Oropharynx- clear Lungs- Clear to ausculation bilaterally, normal work of breathing Chest- ICD pocket is well healed Heart- Regular rate and rhythm, no murmurs, rubs or gallops, PMI not laterally displaced GI- soft, NT, ND, + BS Extremities- no clubbing, cyanosis, or edema  ICD interrogation- reviewed in detail today,  See PACEART report  ekg tracing ordered today is personally reviewed and shows sinus, IVCD (QRS 130 msec)  Wt Readings from Last 3 Encounters:  02/05/21 99 lb 12.8 oz (45.3 kg)  01/21/21 102 lb 12.8 oz (46.6 kg)  12/05/20 106 lb (48.1 kg)    Assessment and Plan:  1.  Chronic systolic dysfunction/ nonischemic CM/ prior VT/VF euvolemic today Stable on an appropriate medical regimen Normal ICD function See Pace Art report No changes today he is not device dependant today He is at [facility] Arrhythmias are stable on amiodarone. We will need to follow him closely on this medicine to avoid toxicity. Risks, benefits, and alternatives to ICD pulse generator replacement were discussed in detail today.  The patient understands that risks include but are not limited to bleeding, infection, pneumothorax, perforation, tamponade, vascular damage, renal failure, MI, stroke, death, inappropriate shocks, damage to his existing leads, and lead dislodgement and wishes to proceed.  We will therefore schedule the procedure at the next available time. He has a IVCD with QRS 130 msec.  I would not therefore advise CRT.  He has responded well to BAT therapy.  2. HTN Stable No change required today  3. afib Well controlled H/o stroke He is on xarelto Hold xarelto 24 hours prior to ICD generator change  Risks, benefits and potential toxicities for medications prescribed and/or refilled reviewed with patient today.   Hillis Range MD, [facility] 02/05/2021 4:25 PM

## 2021-02-06 LAB — BASIC METABOLIC PANEL
BUN/Creatinine Ratio: 8 — ABNORMAL LOW (ref 9–20)
BUN: 7 mg/dL (ref 6–24)
CO2: 21 mmol/L (ref 20–29)
Calcium: 9.9 mg/dL (ref 8.7–10.2)
Chloride: 99 mmol/L (ref 96–106)
Creatinine, Ser: 0.85 mg/dL (ref 0.76–1.27)
Glucose: 73 mg/dL (ref 65–99)
Potassium: 4.7 mmol/L (ref 3.5–5.2)
Sodium: 140 mmol/L (ref 134–144)
eGFR: 102 mL/min/{1.73_m2} (ref >59)

## 2021-02-06 LAB — CBC WITH DIFFERENTIAL/PLATELET
Basophils Absolute: 0.1 10*3/uL (ref 0.0–0.2)
Basos: 1 %
EOS (ABSOLUTE): 0.3 10*3/uL (ref 0.0–0.4)
Eos: 3 %
Hematocrit: 42.6 % (ref 37.5–51.0)
Hemoglobin: 14.6 g/dL (ref 13.0–17.7)
Immature Grans (Abs): 0 10*3/uL (ref 0.0–0.1)
Immature Granulocytes: 0 %
Lymphocytes Absolute: 1.8 10*3/uL (ref 0.7–3.1)
Lymphs: 20 %
MCH: 33.7 pg — ABNORMAL HIGH (ref 26.6–33.0)
MCHC: 34.3 g/dL (ref 31.5–35.7)
MCV: 98 fL — ABNORMAL HIGH (ref 79–97)
Monocytes Absolute: 0.6 10*3/uL (ref 0.1–0.9)
Monocytes: 6 %
Neutrophils Absolute: 6.4 10*3/uL (ref 1.4–7.0)
Neutrophils: 70 %
Platelets: 243 10*3/uL (ref 150–450)
RBC: 4.33 x10E6/uL (ref 4.14–5.80)
RDW: 12 % (ref 11.6–15.4)
WBC: 9.2 10*3/uL (ref 3.4–10.8)

## 2021-02-10 ENCOUNTER — Telehealth: Payer: Self-pay | Admitting: Internal Medicine

## 2021-02-10 ENCOUNTER — Ambulatory Visit (INDEPENDENT_AMBULATORY_CARE_PROVIDER_SITE_OTHER): Payer: Medicare Other

## 2021-02-10 DIAGNOSIS — I472 Ventricular tachycardia, unspecified: Secondary | ICD-10-CM

## 2021-02-10 NOTE — Telephone Encounter (Signed)
Patient would like Dr. [name] or a nurse to call him back. He needs to know what the 02/12/20 Appt is for and if it is necessary. He can be reached at [phone].

## 2021-02-10 NOTE — Telephone Encounter (Signed)
Returned call to patient and advised him that he is scheduled for CPX tomorrow which was ordered by Dr. Shirlee Latch. He states he thought he had an appointment with Dr. [name]. I advised him of time and location of test and he verbalized understanding and agreement. He thanked me for the call.

## 2021-02-11 ENCOUNTER — Other Ambulatory Visit: Payer: Self-pay

## 2021-02-11 ENCOUNTER — Ambulatory Visit (HOSPITAL_COMMUNITY): Payer: Medicare Other | Attending: Cardiology

## 2021-02-11 DIAGNOSIS — I5022 Chronic systolic (congestive) heart failure: Secondary | ICD-10-CM

## 2021-02-11 MED ORDER — SACUBITRIL-VALSARTAN 24-26 MG PO TABS: 1.0000 | ORAL_TABLET | Freq: Two times a day (BID) | ORAL | Status: DC

## 2021-02-11 NOTE — Telephone Encounter (Signed)
Patient called to tell me that he didn't feel good this weekend very weak.  He said that Dr Shirlee Latch had in creased his Sherryll Burger and he thought it was to much. He said he had no energy at all.   I told him that I would speak to Dr Shirlee Latch and give him a call back.   Spoke with Dr Shirlee Latch  Per Dr Shirlee Latch go back to regular dose of Entresto 24-26 mg bid.   Called patient back to inform him of Dr Shirlee Latch recommendations.  He was very appreciative of my call back.  I confirmed with HF clinic that he could split his 49/51 mg in half.   Will update med list.

## 2021-02-11 NOTE — Research (Addendum)
Pt came in to titrate device after he noticed a little stim at home. He was seeing dr Shirlee Latch today. Dr Shirlee Latch increased his Sherryll Burger.   Section A:  Administrative Section  Subject ID: __[id number]__ - _[id number]__ - 015 Subject Initials: _B_ _W_ _E_  Visit Interval:    []  Screening/Baseline    []  Activation   []  0.5 Month       []  1 Month     []  2 Month     []  3 Month       []  6 Month     []  12 Month         [x]  Unscheduled, reason for visit: _pt noted  a little stim at home _____  Section B:  Device Information   Battery Battery Voltage: 2.98 V   Battery Life: 72 Months   RRT Date: 31-Jan-2027 (DD/MMM/YYYY)  Lead Impeadance Right Lead _1086__ Ohms    []  Low    []  High   Left Lead _________ Ohms    []  Low    [x]  High  Programmed Settings Pathway Pulse Width Amplitude Frequency   []  Left 65 6.2 40   [x]  Right     Section C:  Programming Information (12 Month and Unscheduled - not required)  Date: _15_/_FEB_/_2022_     (DD / MMM / ) Device information, therapy schedule and programmed settings can be found on the session summary report  Site Clinician Present: __Kimberly Latishia Suitt :) _____________________________  Employee helping with programming: __Kyle Wolf______ []  N/A  Location of CV[employer] person: [x]  []  Onsite Remote  Subject Experience   1. Did the subject experience transient bradycardia or hypotension during device testing? [x]  []  Yes No   2. Did the subject experience transient electrical stimulation of non-vascular tissues? [x]  []  Yes No   3. If yes to either question above, was intervention beyond reprogramming needed or was it associated with an additional untoward event? []  [x]  Yes No   4a. Is there any indication that there has been unacceptable device interaction between the CVRx device and other implanted electrical stimulators or sensors device? []  [x]  []  Yes No N/A (no other device)   4b.                   Section D:  Arrythmia Interventions  Has the  subject received a cardiac ablation since the last study visit? []  Yes  Date of last procedure:   ____/____/_____ (DD/MMM/YYYY)   [x]  No   []  Unknown   If yes, what type and # of ablations Type []  []  Atrial     []  []  Ventricular    # of procedures: ____________   Was this pre-planned prior to CVRx implant? []  Yes    []  No (complete/update AE form)  Has the subject received a cardioversion since the last study visit? []  Yes  Date of last procedure:   ____/____/_____ (DD/MMM/YYYY)   [x]  No   []  Unknown   If yes, what type and # of cardioversions? Type []  Medications     []  Electrical Shock    # of procedures: _____________   Was this pre-planned prior to CV[employer] implant? []  Yes    []   No (complete/update AE form)    Section E:  Adverse Events (N/A for Implant Interval)  Have any new adverse events or updates to existing events occurred since last visit? []   Yes (complete/update AE form)   [x]   No  Section F:  Medication Changes  Have there been any changes to the subject's home use medications for Arrhythmia, Antiplatelet/Anticoagulation and Heart failure medications since the last visit? [x]  Yes (update Med form)   []  No  Section G:  Comments                     Section H:  Signature    Person completing form (Print Name): :) __________  Signature: :) ____ Date: __02/15/2022____________

## 2021-02-12 LAB — CUP PACEART REMOTE DEVICE CHECK
Battery Remaining Longevity: 0 mo
Battery Voltage: 2.59 V
Brady Statistic RV Percent Paced: 1 %
Date Time Interrogation Session: 20220306093901
HighPow Impedance: 90 Ohm
HighPow Impedance: 90 Ohm
Implantable Lead Implant Date: 20110909
Implantable Lead Location: 753860
Implantable Lead Model: 7122
Implantable Pulse Generator Implant Date: 20110909
Lead Channel Impedance Value: 1050 Ohm
Lead Channel Pacing Threshold Amplitude: 1.5 V
Lead Channel Pacing Threshold Pulse Width: 0.5 ms
Lead Channel Sensing Intrinsic Amplitude: 10 mV
Lead Channel Setting Pacing Amplitude: 2.5 V
Lead Channel Setting Pacing Pulse Width: 0.5 ms
Lead Channel Setting Sensing Sensitivity: 0.5 mV
Pulse Gen Serial Number: 614505

## 2021-02-14 NOTE — Research (Signed)
[employer] Informed Consent   Subject Name: Chad Christensen  Subject met inclusion and exclusion criteria.  The informed consent form, study requirements and expectations were reviewed with the subject and questions and concerns were addressed prior to the signing of the consent form.  The subject verbalized understanding of the trial requirements.  The subject agreed to participate in the [facility] trial and signed the informed consent at 1230 on 12/05/2020.  The informed consent was obtained prior to performance of any protocol-specific procedures for the subject.  A copy of the signed informed consent was given to the subject and a copy was placed in the subject's medical record.  Subject re-consented  Document Number: [id number] Rev. B Version Date: July 24, 2020 IRB approved 10/07/2020  Philemon Kingdom D

## 2021-02-18 NOTE — Progress Notes (Signed)
Remote ICD transmission.   

## 2021-02-24 ENCOUNTER — Ambulatory Visit (INDEPENDENT_AMBULATORY_CARE_PROVIDER_SITE_OTHER): Payer: Medicare Other

## 2021-02-24 DIAGNOSIS — I5022 Chronic systolic (congestive) heart failure: Secondary | ICD-10-CM

## 2021-02-24 DIAGNOSIS — Z95810 Presence of automatic (implantable) cardiac defibrillator: Secondary | ICD-10-CM

## 2021-02-25 ENCOUNTER — Telehealth: Payer: Self-pay

## 2021-02-25 NOTE — Telephone Encounter (Signed)
Remote ICM transmission received.  Attempted call to patient regarding ICM remote transmission and left message to return call   

## 2021-02-25 NOTE — Progress Notes (Signed)
EPIC Encounter for ICM Monitoring  Patient Name: Chad Christensen is a 55 y.o. male Date: 02/25/2021 Primary Care Physican: Daphine Deutscher Primary Cardiologist:McLean Electrophysiologist: Allred 01/21/2021 OfficeWeight: 102lbs.   Attempted call to patient and unable to reach.  Left message to return call. Transmission reviewed.  Device battery replacement scheduled for 02/27/2021.  CorvueThoracic impedancesuggestingnormal fluid levels. Impedance baseline trending higher. Patient has Barostim.  Prescribed:Furosemide 40 mg 1 tablet as needed.  Labs: 02/05/2021 Creatinine 0.85, BUN 7,   Potassium 4.7, Sodium 140, GFR 102 01/21/2021 Creatinine 0.86, BUN 11, Potassium 4.0, Sodium 139, GFR >60  12/05/2020 Creatinine 0.94, BUN 16, Potassium 4.1, Sodium 138, GFR >60  10/09/2020 Creatinine 0.88, BUN 13, Potassium 4.2, Sodium 139  A complete set of results can be found in Results Review.  Recommendations: Unable to reach.    Follow-up plan: ICM clinic phone appointment on5/01/2021. 91 day device clinic remote transmission to be determined after battery replacement.   EP/Cardiology Office Visits:4/15/2022with Dr.McLean.    Copy of ICM check sent to Dr.Allred  3 month ICM trend: 02/24/2021.    1 Year ICM trend:       Karie Soda, RN 02/25/2021 2:03 PM

## 2021-02-26 NOTE — Telephone Encounter (Signed)
Attempted call back at 10:20 and left message that was trying to return call before 11:00 AM as requested.

## 2021-02-26 NOTE — Pre-Procedure Instructions (Signed)
Attempted to call patient regarding procedure instructions for tomorrow.  Left voice mail on the following items: Arrival time 1230, needs covid test on arrical Nothing to eat or drink after midnight No meds AM of procedure Responsible person to drive you home and stay with you for 24 hrs Wash with special soap night before and morning of procedure If on anti-coagulant drug instructions Xarelto- none today

## 2021-02-26 NOTE — Telephone Encounter (Signed)
    Pt is returning call, he said he is available until 11 am today, if its important call him before 11 am if not he said to tell him tomorrow since he have his surgery tomorrow. He said he will be resting and will not be available after 11 am today

## 2021-02-26 NOTE — Progress Notes (Addendum)
Attempted return call to patient per phone message to call him before 11:00 AM.  Unable to reach at attempted 10:37 AM call.  Note added to OV appointment to have patient update DPR with correct phone number to leave detailed messages.

## 2021-02-27 ENCOUNTER — Ambulatory Visit (HOSPITAL_COMMUNITY)
Admission: RE | Disposition: A | Discharge status: Home / Self Care | Payer: Medicare Other | Source: Home / Self Care | Attending: Internal Medicine

## 2021-02-27 ENCOUNTER — Ambulatory Visit (HOSPITAL_COMMUNITY)
Admission: RE | Admit: 2021-02-27 | Discharge: 2021-02-27 | Disposition: A | Discharge status: Home / Self Care | Payer: Medicare Other | Attending: Internal Medicine | Admitting: Internal Medicine

## 2021-02-27 ENCOUNTER — Other Ambulatory Visit: Payer: Self-pay

## 2021-02-27 DIAGNOSIS — I5022 Chronic systolic (congestive) heart failure: Secondary | ICD-10-CM | POA: Insufficient documentation

## 2021-02-27 DIAGNOSIS — Z4502 Encounter for adjustment and management of automatic implantable cardiac defibrillator: Secondary | ICD-10-CM | POA: Diagnosis present

## 2021-02-27 DIAGNOSIS — Z20822 Contact with and (suspected) exposure to covid-19: Secondary | ICD-10-CM | POA: Diagnosis not present

## 2021-02-27 DIAGNOSIS — I428 Other cardiomyopathies: Secondary | ICD-10-CM

## 2021-02-27 DIAGNOSIS — Z8673 Personal history of transient ischemic attack (TIA), and cerebral infarction without residual deficits: Secondary | ICD-10-CM | POA: Insufficient documentation

## 2021-02-27 DIAGNOSIS — Z7984 Long term (current) use of oral hypoglycemic drugs: Secondary | ICD-10-CM | POA: Diagnosis not present

## 2021-02-27 DIAGNOSIS — Z79899 Other long term (current) drug therapy: Secondary | ICD-10-CM | POA: Insufficient documentation

## 2021-02-27 DIAGNOSIS — I472 Ventricular tachycardia: Secondary | ICD-10-CM | POA: Insufficient documentation

## 2021-02-27 DIAGNOSIS — I110 Hypertensive heart disease with heart failure: Secondary | ICD-10-CM | POA: Diagnosis not present

## 2021-02-27 DIAGNOSIS — Z8674 Personal history of sudden cardiac arrest: Secondary | ICD-10-CM | POA: Insufficient documentation

## 2021-02-27 DIAGNOSIS — Z7901 Long term (current) use of anticoagulants: Secondary | ICD-10-CM | POA: Diagnosis not present

## 2021-02-27 DIAGNOSIS — I480 Paroxysmal atrial fibrillation: Secondary | ICD-10-CM | POA: Insufficient documentation

## 2021-02-27 HISTORY — PX: ICD GENERATOR CHANGEOUT: EP1231

## 2021-02-27 LAB — SARS CORONAVIRUS 2 BY RT PCR (HOSPITAL ORDER, PERFORMED IN ~~LOC~~ HOSPITAL LAB): SARS Coronavirus 2: NEGATIVE

## 2021-02-27 SURGERY — ICD GENERATOR CHANGEOUT

## 2021-02-27 MED ORDER — LIDOCAINE HCL 1 % IJ SOLN
INTRAMUSCULAR | Status: AC
  Filled 2021-02-27: qty 40

## 2021-02-27 MED ORDER — SODIUM CHLORIDE 0.9 % IV SOLN
INTRAVENOUS | Status: AC
  Filled 2021-02-27: qty 2

## 2021-02-27 MED ORDER — VANCOMYCIN HCL IN DEXTROSE 1-5 GM/200ML-% IV SOLN
INTRAVENOUS | Status: AC
  Filled 2021-02-27: qty 200

## 2021-02-27 MED ORDER — SODIUM CHLORIDE 0.9% FLUSH: 3.0000 mL | Freq: Two times a day (BID) | INTRAVENOUS | Status: DC

## 2021-02-27 MED ORDER — POVIDONE-IODINE 10 % EX SWAB
2.0000 "application " | Freq: Once | CUTANEOUS | Status: AC
  Administered 2021-02-27: 2 via TOPICAL

## 2021-02-27 MED ORDER — VANCOMYCIN HCL 750 MG/150ML IV SOLN
750.0000 mg | INTRAVENOUS | Status: AC
  Administered 2021-02-27: 750 mg via INTRAVENOUS
  Filled 2021-02-27: qty 150

## 2021-02-27 MED ORDER — SODIUM CHLORIDE 0.9 % IV SOLN
80.0000 mg | INTRAVENOUS | Status: DC
  Filled 2021-02-27: qty 2

## 2021-02-27 MED ORDER — LIDOCAINE HCL 1 % IJ SOLN
INTRAMUSCULAR | Status: AC
  Filled 2021-02-27: qty 20

## 2021-02-27 MED ORDER — LIDOCAINE HCL (PF) 1 % IJ SOLN
INTRAMUSCULAR | Status: DC | PRN
  Administered 2021-02-27: 60 mL

## 2021-02-27 MED ORDER — SODIUM CHLORIDE 0.9 % IV SOLN: INTRAVENOUS | Status: DC

## 2021-02-27 SURGICAL SUPPLY — 4 items
CABLE SURGICAL S-101-97-12 (CABLE) ×2 IMPLANT
ICD GALLANT VR CDVRA500Q (ICD Generator) ×1 IMPLANT
PAD PRO RADIOLUCENT 2001M-C (PAD) ×2 IMPLANT
TRAY PACEMAKER INSERTION (PACKS) ×2 IMPLANT

## 2021-02-27 NOTE — Interval H&P Note (Signed)
History and Physical Interval Note:  02/27/2021 2:29 PM  Chad Christensen  has presented today for surgery, with the diagnosis of ERI.  The various methods of treatment have been discussed with the patient and family. After consideration of risks, benefits and other options for treatment, the patient has consented to  Procedure(s): ICD GENERATOR CHANGEOUT (N/A) as a surgical intervention.  The patient's history has been reviewed, patient examined, no change in status, stable for surgery.  I have reviewed the patient's chart and labs.  Questions were answered to the patient's satisfaction.     ICD Criteria  Current LVEF:<20%. Within 12 months prior to implant: Yes   Heart failure history: Yes, Class III  Cardiomyopathy history: Yes, Non-Ischemic Cardiomyopathy.  Atrial Fibrillation/Atrial Flutter: Yes, Paroxysmal.  Ventricular tachycardia history: Yes, Hemodynamic instability present. VT Type: Sustained Ventricular Tachycardia - Monomorphic and Polymorphic.  Cardiac arrest history: Yes, Ventricular Fibrillation.  History of syndromes with risk of sudden death: No.  Previous ICD: Yes, Reason for ICD:  Secondary prevention.  Current ICD indication: Secondary  PPM indication: No.  Class I or II Bradycardia indication present: No  Beta Blocker therapy for 3 or more months: No, medical reason.  Previously not tolerated due to soft blood prssures  Ace Inhibitor/ARB therapy for 3 or more months: Yes, prescribed.    I have seen Chad Christensen is a 55 y.o. male who presents today for ICD generator change for secondary prevention of sudden death.  The patient's chart has been reviewed and they meet criteria for ICD generator change.  I have had a thorough discussion with the patient reviewing options.  The patient has had opportunities to ask questions and have them answered. The patient and I have decided together through the [facility] Heart Care Share Decision Support Tool to proceed with ICD  generator change at this time.  Risks, benefits, alternatives to ICD generator change were discussed in detail with the patient today. The patient  understands that the risks include but are not limited to bleeding, infection, pneumothorax, perforation, tamponade, vascular damage, renal failure, MI, stroke, death, inappropriate shocks, and lead dislodgement and wishes to proceed.     Hillis Range

## 2021-02-27 NOTE — Discharge Instructions (Signed)
Resume xarelto on 03/03/21 (Monday).  Implantable Cardiac Device Battery Change, Care After  This sheet gives you information about how to care for yourself after your procedure. Your health care provider may also give you more specific instructions. If you have problems or questions, contact your health care provider. What can I expect after the procedure? After your procedure, it is common to have:  Pain or soreness at the site where the cardiac device was inserted.  Swelling at the site where the cardiac device was inserted.  You should received an information card for your new device in 4-8 weeks. Follow these instructions at home: Incision care   Keep the incision clean and dry. ? Do not take baths, swim, or use a hot tub until after your wound check.  ? Do not shower for at least 7 days, or as directed by your health care provider. ? Pat the area dry with a clean towel. Do not rub the area. This may cause bleeding.  Follow instructions from your health care provider about how to take care of your incision. Make sure you: ? Leave stitches (sutures), skin glue, or adhesive strips in place. These skin closures may need to stay in place for 2 weeks or longer. If adhesive strip edges start to loosen and curl up, you may trim the loose edges. Do not remove adhesive strips completely unless your health care provider tells you to do that.  Check your incision area every day for signs of infection. Check for: ? More redness, swelling, or pain. ? More fluid or blood. ? Warmth. ? Pus or a bad smell. Activity  Do not lift anything that is heavier than 10 lb (4.5 kg) until your health care provider says it is okay to do so.  For the first week, or as long as told by your health care provider: ? Avoid lifting your affected arm higher than your shoulder. ? After 1 week, Be gentle when you move your arms over your head. It is okay to raise your arm to comb your hair. ? Avoid strenuous  exercise.  Ask your health care provider when it is okay to: ? Resume your normal activities. ? Return to work or school. ? Resume sexual activity. Eating and drinking  Eat a heart-healthy diet. This should include plenty of fresh fruits and vegetables, whole grains, low-fat dairy products, and lean protein like chicken and fish.  Limit alcohol intake to no more than 1 drink a day for non-pregnant women and 2 drinks a day for men. One drink equals 12 oz of beer, 5 oz of wine, or 1 oz of hard liquor.  Check ingredients and nutrition facts on packaged foods and beverages. Avoid the following types of food: ? Food that is high in salt (sodium). ? Food that is high in saturated fat, like full-fat dairy or red meat. ? Food that is high in trans fat, like fried food. ? Food and drinks that are high in sugar. Lifestyle  Do not use any products that contain nicotine or tobacco, such as cigarettes and e-cigarettes. If you need help quitting, ask your health care provider.  Take steps to manage and control your weight.  Once cleared, get regular exercise. Aim for 150 minutes of moderate-intensity exercise (such as walking or yoga) or 75 minutes of vigorous exercise (such as running or swimming) each week.  Manage other health problems, such as diabetes or high blood pressure. Ask your health care provider how you can manage  these conditions. General instructions  Do not drive for 24 hours after your procedure if you were given a medicine to help you relax (sedative).  Take over-the-counter and prescription medicines only as told by your health care provider.  Avoid putting pressure on the area where the cardiac device was placed.  If you need an MRI after your cardiac device has been placed, be sure to tell the health care provider who orders the MRI that you have a cardiac device.  Avoid close and prolonged exposure to electrical devices that have strong magnetic fields. These  include: ? Cell phones. Avoid keeping them in a pocket near the cardiac device, and try using the ear opposite the cardiac device. ? MP3 players. ? Household appliances, like microwaves. ? Metal detectors. ? Electric generators. ? High-tension wires.  Keep all follow-up visits as directed by your health care provider. This is important. Contact a health care provider if:  You have pain at the incision site that is not relieved by over-the-counter or prescription medicines.  You have any of these around your incision site or coming from it: ? More redness, swelling, or pain. ? Fluid or blood. ? Warmth to the touch. ? Pus or a bad smell.  You have a fever.  You feel brief, occasional palpitations, light-headedness, or any symptoms that you think might be related to your heart. Get help right away if:  You experience chest pain that is different from the pain at the cardiac device site.  You develop a red streak that extends above or below the incision site.  You experience shortness of breath.  You have palpitations or an irregular heartbeat.  You have light-headedness that does not go away quickly.  You faint or have dizzy spells.  Your pulse suddenly drops or increases rapidly and does not return to normal.  You begin to gain weight and your legs and ankles swell. Summary  After your procedure, it is common to have pain, soreness, and some swelling where the cardiac device was inserted.  Make sure to keep your incision clean and dry. Follow instructions from your health care provider about how to take care of your incision.  Check your incision every day for signs of infection, such as more pain or swelling, pus or a bad smell, warmth, or leaking fluid and blood.  Avoid strenuous exercise and lifting your left arm higher than your shoulder for 2 weeks, or as long as told by your health care provider. This information is not intended to replace advice given to you by  your health care provider. Make sure you discuss any questions you have with your health care provider.

## 2021-02-27 NOTE — Progress Notes (Signed)
Pt ambulated without difficulty or bleeding.   Discharged home by [facility] transportation.  Dr [name] aware.  Pt received no sedation. Stable alert and verbalizes understanding of discharge instructions

## 2021-02-28 ENCOUNTER — Encounter (HOSPITAL_COMMUNITY): Payer: Self-pay | Admitting: Internal Medicine

## 2021-02-28 MED FILL — Gentamicin Sulfate Inj 40 MG/ML: INTRAMUSCULAR | Qty: 80 | Status: AC

## 2021-02-28 MED FILL — Lidocaine HCl Local Inj 1%: INTRAMUSCULAR | Qty: 60 | Status: AC

## 2021-03-05 ENCOUNTER — Encounter (HOSPITAL_COMMUNITY): Payer: Self-pay | Admitting: Internal Medicine

## 2021-03-11 ENCOUNTER — Ambulatory Visit: Payer: Medicare Other

## 2021-03-13 ENCOUNTER — Telehealth (HOSPITAL_COMMUNITY): Payer: Self-pay | Admitting: Licensed Clinical Social Worker

## 2021-03-13 DIAGNOSIS — I5032 Chronic diastolic (congestive) heart failure: Secondary | ICD-10-CM

## 2021-03-13 NOTE — Telephone Encounter (Signed)
CSW had reminder in to help pt with transport to clinic appt next Friday.  CSW called pt who confirms he needs help with Wednesday and Friday appts.  CSW submitted [facility] requests for both appts.  [facility] will call him the day before the appt to confirm ride details.  CSW ensured pt had [facility] number to call when he is done with appt on Wednesday and made him also write down my number if there are any concerns.  Pt states he missed several [employer] appts due to transportation not picking him up.  CSW explained that he needs to call to request assistance with transportation and that it is not automatically set up- pt expressed understanding.  Will continue to follow and assist as needed  Burna Sis, LCSW Clinical Social Worker [facility] Desk#: [phone] Cell#: [phone]

## 2021-03-19 ENCOUNTER — Ambulatory Visit (INDEPENDENT_AMBULATORY_CARE_PROVIDER_SITE_OTHER): Payer: Medicare Other | Admitting: Student

## 2021-03-19 ENCOUNTER — Other Ambulatory Visit: Payer: Self-pay

## 2021-03-19 DIAGNOSIS — I5022 Chronic systolic (congestive) heart failure: Secondary | ICD-10-CM | POA: Diagnosis not present

## 2021-03-19 LAB — CUP PACEART INCLINIC DEVICE CHECK
Battery Remaining Longevity: 124 mo
Brady Statistic RV Percent Paced: 0.08 %
Date Time Interrogation Session: 20220413132001
HighPow Impedance: 75.375
Implantable Lead Implant Date: 20110909
Implantable Lead Location: 753860
Implantable Lead Model: 7122
Implantable Pulse Generator Implant Date: 20220324
Lead Channel Impedance Value: 1212.5 Ohm
Lead Channel Pacing Threshold Amplitude: 1.25 V
Lead Channel Pacing Threshold Amplitude: 1.25 V
Lead Channel Pacing Threshold Pulse Width: 0.8 ms
Lead Channel Pacing Threshold Pulse Width: 0.8 ms
Lead Channel Sensing Intrinsic Amplitude: 11.4 mV
Lead Channel Setting Pacing Amplitude: 2.5 V
Lead Channel Setting Pacing Pulse Width: 0.8 ms
Lead Channel Setting Sensing Sensitivity: 0.5 mV
Pulse Gen Serial Number: 810021943

## 2021-03-19 NOTE — Progress Notes (Signed)
Wound check appointment. Steri-strips removed. Wound without redness or edema. Incision edges approximated, wound well healed. Normal device function. Thresholds, sensing, and impedances consistent with implant measurements. RV threshold 1.5V @ 0.7 ms, 1.25 V @ 0.8 ms. Chronic programming changed to 2.5V @ 0.8 ms. Histogram distribution appropriate for patient and level of activity. No mode switches or ventricular arrhythmias noted. Patient educated about wound care, arm mobility, lifting restrictions, shock plan. ROV in 3 months with Dr. [name].  Pt states he has been taking Entresto 49/51 mg ONCE daily. He states he had been taking 24/26 ONCE daily prior to increase at recent visit in HF clinic. He went from 24/26 mg once daily to 49/51 mg BID and felt awful after one or two doses.   BP 88/58 today.  I recommended that if he does not tolerated Entresto twice a day, he hold it until he sees Dr. Shirlee Latch this Friday.   Casimiro Needle [address]" [place], PA-C  03/19/2021 1:54 PM

## 2021-03-21 ENCOUNTER — Other Ambulatory Visit: Payer: Self-pay

## 2021-03-21 ENCOUNTER — Ambulatory Visit (HOSPITAL_COMMUNITY)
Admission: RE | Admit: 2021-03-21 | Discharge: 2021-03-21 | Disposition: A | Discharge status: Home / Self Care | Payer: Medicare Other | Source: Ambulatory Visit | Attending: Cardiology | Admitting: Cardiology

## 2021-03-21 ENCOUNTER — Encounter (HOSPITAL_COMMUNITY): Payer: Self-pay | Admitting: Cardiology

## 2021-03-21 VITALS — BP 102/60 | HR 62 | Wt 102.8 lb

## 2021-03-21 DIAGNOSIS — R64 Cachexia: Secondary | ICD-10-CM | POA: Insufficient documentation

## 2021-03-21 DIAGNOSIS — Z87891 Personal history of nicotine dependence: Secondary | ICD-10-CM | POA: Diagnosis not present

## 2021-03-21 DIAGNOSIS — Z7984 Long term (current) use of oral hypoglycemic drugs: Secondary | ICD-10-CM | POA: Insufficient documentation

## 2021-03-21 DIAGNOSIS — J449 Chronic obstructive pulmonary disease, unspecified: Secondary | ICD-10-CM | POA: Diagnosis not present

## 2021-03-21 DIAGNOSIS — Z79899 Other long term (current) drug therapy: Secondary | ICD-10-CM | POA: Insufficient documentation

## 2021-03-21 DIAGNOSIS — M549 Dorsalgia, unspecified: Secondary | ICD-10-CM | POA: Diagnosis not present

## 2021-03-21 DIAGNOSIS — I480 Paroxysmal atrial fibrillation: Secondary | ICD-10-CM | POA: Insufficient documentation

## 2021-03-21 DIAGNOSIS — R531 Weakness: Secondary | ICD-10-CM | POA: Insufficient documentation

## 2021-03-21 DIAGNOSIS — Z7901 Long term (current) use of anticoagulants: Secondary | ICD-10-CM | POA: Diagnosis not present

## 2021-03-21 DIAGNOSIS — I5022 Chronic systolic (congestive) heart failure: Secondary | ICD-10-CM | POA: Diagnosis not present

## 2021-03-21 DIAGNOSIS — I110 Hypertensive heart disease with heart failure: Secondary | ICD-10-CM | POA: Insufficient documentation

## 2021-03-21 DIAGNOSIS — I472 Ventricular tachycardia: Secondary | ICD-10-CM | POA: Insufficient documentation

## 2021-03-21 DIAGNOSIS — Z8673 Personal history of transient ischemic attack (TIA), and cerebral infarction without residual deficits: Secondary | ICD-10-CM | POA: Diagnosis not present

## 2021-03-21 DIAGNOSIS — I428 Other cardiomyopathies: Secondary | ICD-10-CM | POA: Insufficient documentation

## 2021-03-21 LAB — COMPREHENSIVE METABOLIC PANEL
ALT: 10 U/L (ref 0–44)
AST: 12 U/L — ABNORMAL LOW (ref 15–41)
Albumin: 4.4 g/dL (ref 3.5–5.0)
Alkaline Phosphatase: 31 U/L — ABNORMAL LOW (ref 38–126)
Anion gap: 7 (ref 5–15)
BUN: 8 mg/dL (ref 6–20)
CO2: 27 mmol/L (ref 22–32)
Calcium: 9.4 mg/dL (ref 8.9–10.3)
Chloride: 103 mmol/L (ref 98–111)
Creatinine, Ser: 0.93 mg/dL (ref 0.61–1.24)
GFR, Estimated: >60 mL/min (ref >60)
Glucose, Bld: 71 mg/dL (ref 70–99)
Potassium: 4.1 mmol/L (ref 3.5–5.1)
Sodium: 137 mmol/L (ref 135–145)
Total Bilirubin: 0.6 mg/dL (ref 0.3–1.2)
Total Protein: 7.2 g/dL (ref 6.5–8.1)

## 2021-03-21 LAB — TSH: TSH: 2.372 u[IU]/mL (ref 0.350–4.500)

## 2021-03-21 LAB — DIGOXIN LEVEL: Digoxin Level: 1.1 ng/mL (ref 0.8–2.0)

## 2021-03-21 MED ORDER — ENTRESTO 24-26 MG PO TABS: 1.0000 | ORAL_TABLET | Freq: Two times a day (BID) | ORAL | 3 refills | Status: DC

## 2021-03-21 NOTE — Patient Instructions (Signed)
Start Entresto 24/26 mg Twice daily   Make sure to stay Hydrated  Labs done today, we will call you for abnormal results  Your physician recommends that you return for lab work in: 10-14 days, we have provided you a prescription to have this done locally  Your physician recommends that you schedule a follow-up appointment in: 3 months  If you have any questions or concerns before your next appointment please send [place] a message through [place] or call our office at [phone].    TO LEAVE A MESSAGE FOR THE NURSE SELECT OPTION 2, PLEASE LEAVE A MESSAGE INCLUDING: . YOUR NAME . DATE OF BIRTH . CALL BACK NUMBER . REASON FOR CALL**this is important as we prioritize the call backs  YOU WILL RECEIVE A CALL BACK THE SAME DAY AS LONG AS YOU CALL BEFORE 4:00 PM  At the [facility], you and your health needs are our priority. As part of our continuing mission to provide you with exceptional heart care, we have created designated Provider Care Teams. These Care Teams include your primary Cardiologist (physician) and Advanced Practice Providers (APPs- Physician Assistants and Nurse Practitioners) who all work together to provide you with the care you need, when you need it.   You may see any of the following providers on your designated Care Team at your next follow up: Marland Kitchen Dr Arvilla Meres . Dr Marca Ancona . Dr Thornell Mule . Tonye Becket, NP . Robbie Lis, PA . Shanda Bumps [place],NP . Karle Plumber, PharmD   Please be sure to bring in all your medications bottles to every appointment.

## 2021-03-23 NOTE — Progress Notes (Signed)
PCP: Betti Cruz, PA-C EP: Dr. [name] Cardiology: Dr. Shirlee Latch  55 y.o. with history of nonischemic dilated cardiomyopathy and paroxysmal atrial fibrillation presents for followup of CHF.  Patient lives in [place], [place].  He gets sporadic cardiology care as it is hard for him to get a ride to [place] and he does not want to see any of the cardiologists in [place]. He is followed by Dr. [name] for his [facility] ICD.  I saw him initially in 3/19, when he was referred by Dr. [name] for left/right heart cath.  This showed no coronary disease and low but not markedly low cardiac output (CI 2.22).  Echo in 3/19 showed a severely dilated LV with EF 20%.    Echo in 6/21 showed EF <20%, severe LV dilation, normal RV size with mildly decreased systolic function, IVC normal.  RHC was done in 6/21, showing low filling pressures and CI 2.38 by Fick/2.68 by Thermo.  CPX in 6/21 showed both severe deconditioning and severe HF limitation.  In 7/21, he had barostimulation activation therapy placed. Echo in 12/21 showed EF <20%, mild LV dilation, normal RV size and systolic function, IVC normal.   He returns for followup of CHF. He walks with a cane chronically due to arthritis (neck and back pain, has had back surgery) and poor balance with prior CVA.  He was unable to do a CPX.  He is still very weak.  Walks slowly with a walker.  Weight stable.  He increased Entresto to 49/51 bid but this made him dizzy. He stopped it altogether (rather than decreasing to the lower dose).  He was not lightheaded in the past when on lower dose Entresto.  No dyspnea walking on flat ground, just weakness.  He rides his exercise bike.     ECG (personally reviewed): NSR but there is interference from barostim device.   Labs (1/20): digoxin 0.6, K 4.4, creatinine 0.76 Labs (6/20): digoxin 0.6, K 4.9, creatinine 1.05 Labs (6/21): LFTs normal Labs (7/21): K 4.6, creatinine 0.95 => 1.04 Labs (10/21): digoxin 0.9 Labs (11/21): K  4.2, creatinine 0.88 Labs (12/21): K 4.1, creatinine 0.94, TSH normal, digoxin 1.0, LFTs normal Labs (2/22): hgb 14.7 Labs (3/22): K 4.7, creatinine 0.85  PMH: 1. H/o VT 2. H/o GI bleeding 3. Colon polyps 4. Remote substance abuse. 5. HTN 6. Gout 7. Atrial fibrillation: Paroxysmal.  8. ?COPD: Smokes cigars.  9. Chronic systolic CHF: Nonischemic cardiomyopathy.  St Jude ICD.  - Echo (3/19): EF 20%, severe LV dilation, diffuse hypokinesis, mild-moderate MR, low normal RV function.  - LHC/RHC (3/19): No CAD; mean RA 2, PA 24/8, mean PCWP 13, CI 2.2.  - Echo (6/21): EF <20%, severe LV dilation, normal RV size with mildly decreased systolic function, IVC normal.  - RHC (6/21): mean RA 1, PA 16/3, mean PCWP 3, CI 2.38 Fick/2.68 thermo.   - CPX (6/21): peak VO2 10.7, RER 0.85 (submaximal), VE/VCO2 slope 80 => severe deconditioning and severe HF limitation.  PFTs were restrictive.  - Barostimulator implantation (7/21).  - Echo (12/21): EF <20%, mild LV dilation, normal RV size and systolic function, IVC normal.  10. Prior CVA: Likely related to atrial fibrillation.  - Carotid dopplers (12/21): Normal.  11. Back surgery.   Social History   Socioeconomic History  . Marital status: Single    Spouse name: Not on file  . Number of children: Not on file  . Years of education: Not on file  . Highest education level: Not  on file  Occupational History  . Occupation: Disability  Tobacco Use  . Smoking status: Former Smoker    Packs/day: 0.30    Years: 25.00    Pack years: 7.50    Types: Cigars    Start date: 09/13/1982    Quit date: 04/2020    Years since quitting: 0.9  . Smokeless tobacco: Never Used  Vaping Use  . Vaping Use: Never used  Substance and Sexual Activity  . Alcohol use: Not Currently    Alcohol/week: 2.0 - 3.0 standard drinks    Types: 2 - 3 Cans of beer per week  . 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 Sexual activity: Not  Currently  Other Topics Concern  . Not on file  Social History Narrative   Lives in [place], [place].   Went to [employer] [place] for 2.5 years.   Social Determinants of Health   Financial Resource Strain: Not on file  Food Insecurity: Not on file  Transportation Needs: Unmet Transportation Needs  . Lack of Transportation (Medical): Yes  . Lack of Transportation (Non-Medical): No  Physical Activity: Not on file  Stress: Not on file  Social Connections: Not on file  Intimate Partner Violence: Not on file   Family History  Problem Relation Age of Onset  . Cardiomyopathy Brother        Dilated   ROS: All systems reviewed and negative except as per HPI.   Current Outpatient Medications  Medication Sig Dispense Refill  . acetaminophen (TYLENOL) 325 MG tablet Take 650 mg by mouth every 6 (six) hours as needed for moderate pain.    Marland Kitchen amiodarone (PACERONE) 200 MG tablet Take 100 mg by mouth daily.    . clonazePAM (KLONOPIN) 0.5 MG tablet Take 0.5 mg by mouth 3 (three) times daily.    Marland Kitchen COLCRYS 0.6 MG tablet Take 0.6 mg by mouth 2 (two) times daily as needed (for gout flare up).     . Cyanocobalamin (B-12 PO) Take 1 tablet by mouth daily.    . dapagliflozin propanediol (FARXIGA) 10 MG TABS tablet Take 1 tablet (10 mg total) by mouth daily before breakfast. 90 tablet 3  . digoxin (LANOXIN) 0.125 MG tablet TAKE 1 TABLET BY MOUTH DAILY 90 tablet 0  . furosemide (LASIX) 40 MG tablet Take 40 mg by mouth daily as needed for fluid.     . mirtazapine (REMERON) 15 MG tablet Take 15 mg by mouth at bedtime.    Marland Kitchen nystatin (MYCOSTATIN) 100000 UNIT/ML suspension Take 5 mLs by mouth daily as needed (thrush).    . pramipexole (MIRAPEX) 0.125 MG tablet Take 0.125 mg by mouth 3 (three) times daily as needed (restless leg).     . rivaroxaban (XARELTO) 20 MG TABS tablet Take 20 mg by mouth daily with supper.    . sacubitril-valsartan (ENTRESTO) 24-26 MG Take 1 tablet by mouth 2 (two) times daily. 60 tablet 3  .  simvastatin (ZOCOR) 5 MG tablet Take 5 mg by mouth daily.    Marland Kitchen spironolactone (ALDACTONE) 25 MG tablet Take 1 tablet (25 mg total) by mouth daily. *Patient is overdue for an appointment and needs to call and schedule for further refills* 15 tablet 0   No current facility-administered medications for this encounter.   BP 102/60   Pulse 62   Wt 46.6 kg (102 lb 12.8 oz)   SpO2 100%   BMI 13.94 kg/m  General:  NAD, thin Neck: No JVD, no thyromegaly or thyroid nodule.  Lungs: Clear to auscultation bilaterally with normal respiratory effort. CV: Nondisplaced PMI.  Heart regular S1/S2, no S3/S4, no murmur.  No peripheral edema.  No carotid bruit.  Normal pedal pulses.  Abdomen: Soft, nontender, no hepatosplenomegaly, no distention.  Skin: Intact without lesions or rashes.  Neurologic: Alert and oriented x 3.  Psych: Normal affect. Extremities: No clubbing or cyanosis.  HEENT: Normal.   Assessment/Plan: 1. Chronic systolic CHF: Echo 6/21 with EF <20%, severe LV dilation, normal RV size with mildly decreased systolic function, IVC normal.  LHC/RHC in 3/20 with no coronary disease, relatively preserved cardiac output.  Nonischemic cardiomyopathy with St Jude ICD.  Probably not CRT candidate, has IVCD but not classic LBBB. RHC in 6/21 showed low filling pressures and preserved cardiac output, but CPX (submaximal) in 6/21 showed severe limitation due to HF as well as severe limitation due to deconditioning. He is now s/p barostimulation activation therapy.  Echo in 12/21 showed EF still < 20% with normal RV.  He is not volume overloaded on exam.  NYHA class III symptoms, still with a lot of fatigue/weakness.   - He has not needed Lasix.  - Continue digoxin 0.125 daily, check level today.  - Continue spironolactone 25 mg daily.  - Continue dapagliflozin 10 mg daily.  - Restart on Entresto at lower dose 24/26 bid, has tolerated this in the past.  I reminded him to stay well-hydrated after starting  Entresto.  - Deconditioned and cachectic, needs to be in better physical shape prior to any consideration of advanced therapies.  Cardiac output was not low on last RHC.  He has been drinking Ensure.  I would like him to do cardiac rehab but he does not have transportation. 2. Smoking/?COPD: Patient has quit smoking.  Restrictive PFTs with 6/21 CPX.  3. H/o VT: He is on amiodarone.  - Check LFTs, TSH today.  He should get a regular eye exam.  - Continue amiodarone 100 mg daily.  4. Atrial fibrillation: Paroxysmal. NSR today.  - Continue Xarelto. CBC today.  - Continue amiodarone.   Followup 2 months.   Chad Christensen 03/23/2021

## 2021-03-25 ENCOUNTER — Telehealth (HOSPITAL_COMMUNITY): Payer: Self-pay | Admitting: *Deleted

## 2021-03-25 DIAGNOSIS — I5022 Chronic systolic (congestive) heart failure: Secondary | ICD-10-CM

## 2021-03-25 NOTE — Telephone Encounter (Signed)
Modesta Messing, [place]  03/25/2021 9:52 AM EDT Back to Top     Pt aware added to lab appt 4/27   Philicia R Wyline Mood, CMA  03/24/2021 3:51 PM EDT      [facility]   Laurey Morale, MD  03/23/2021 11:11 PM EDT      Need to repeat digoxin level as a trough (prior to am digoxin dose)

## 2021-03-25 NOTE — Telephone Encounter (Signed)
-----   Message from Laurey Morale, MD sent at 03/23/2021 11:11 PM EDT ----- Need to repeat digoxin level as a trough (prior to am digoxin dose)

## 2021-03-27 ENCOUNTER — Telehealth (HOSPITAL_COMMUNITY): Payer: Self-pay | Admitting: Licensed Clinical Social Worker

## 2021-03-27 NOTE — Telephone Encounter (Signed)
CSW saw pt had lab scheduled for next week so CSW sent in ride request to [employer].  CSW called pt and informed request had been submitted.  Pt next appts aren't until July so CSW informed pt that I would be on leave during that time and that pt would need to call [facility] directly to set up a ride to get to his appts.  Confirmed pt had [facility] number and explained how he needed to schedule future rides- pt expressed understanding.  Will continue to follow and assist as needed  Burna Sis, LCSW Clinical Social Worker [facility] Desk#: [phone] Cell#: [phone]

## 2021-03-29 ENCOUNTER — Other Ambulatory Visit: Payer: Self-pay | Admitting: Internal Medicine

## 2021-04-02 ENCOUNTER — Other Ambulatory Visit: Payer: Self-pay

## 2021-04-02 ENCOUNTER — Ambulatory Visit (HOSPITAL_COMMUNITY)
Admission: RE | Admit: 2021-04-02 | Discharge: 2021-04-02 | Disposition: A | Discharge status: Home / Self Care | Payer: Medicare Other | Source: Ambulatory Visit | Attending: Internal Medicine | Admitting: Internal Medicine

## 2021-04-02 DIAGNOSIS — I5022 Chronic systolic (congestive) heart failure: Secondary | ICD-10-CM | POA: Insufficient documentation

## 2021-04-02 LAB — BASIC METABOLIC PANEL
Anion gap: 10 (ref 5–15)
BUN: 13 mg/dL (ref 6–20)
CO2: 27 mmol/L (ref 22–32)
Calcium: 9.7 mg/dL (ref 8.9–10.3)
Chloride: 103 mmol/L (ref 98–111)
Creatinine, Ser: 1.03 mg/dL (ref 0.61–1.24)
GFR, Estimated: >60 mL/min (ref >60)
Glucose, Bld: 83 mg/dL (ref 70–99)
Potassium: 4.1 mmol/L (ref 3.5–5.1)
Sodium: 140 mmol/L (ref 135–145)

## 2021-04-02 LAB — DIGOXIN LEVEL: Digoxin Level: 1.4 ng/mL (ref 0.8–2.0)

## 2021-04-03 ENCOUNTER — Telehealth (HOSPITAL_COMMUNITY): Payer: Self-pay | Admitting: *Deleted

## 2021-04-03 MED ORDER — DIGOXIN 125 MCG PO TABS: 0.0625 mg | ORAL_TABLET | Freq: Every day | ORAL | 2 refills | Status: AC

## 2021-04-03 NOTE — Telephone Encounter (Signed)
Pt aware, agreeable, and verbalized understanding 

## 2021-04-03 NOTE — Telephone Encounter (Signed)
-----   Message from Laurey Morale, MD sent at 04/02/2021  1:36 PM EDT ----- Decrease digoxin to 0.0625 daily.

## 2021-04-07 ENCOUNTER — Ambulatory Visit (INDEPENDENT_AMBULATORY_CARE_PROVIDER_SITE_OTHER): Payer: Medicare Other

## 2021-04-07 DIAGNOSIS — I5022 Chronic systolic (congestive) heart failure: Secondary | ICD-10-CM

## 2021-04-07 DIAGNOSIS — Z95810 Presence of automatic (implantable) cardiac defibrillator: Secondary | ICD-10-CM | POA: Diagnosis not present

## 2021-04-10 ENCOUNTER — Telehealth: Payer: Self-pay

## 2021-04-10 NOTE — Telephone Encounter (Signed)
The patient called to get help sending a transmission. I called tech support to get help. Transmission received.

## 2021-04-10 NOTE — Telephone Encounter (Signed)
LMOVM for patient to send missed HF transmission. I left the device clinic direct number to get help sending a transmission.

## 2021-04-11 ENCOUNTER — Telehealth: Payer: Self-pay

## 2021-04-11 ENCOUNTER — Encounter: Payer: Medicare Other | Admitting: Internal Medicine

## 2021-04-11 NOTE — Telephone Encounter (Signed)
Remote ICM transmission received.  Attempted call to patient regarding ICM remote transmission and left message to return call   

## 2021-04-11 NOTE — Progress Notes (Signed)
Patient returned call.  He stated he has been urinating a lot but has not needed PRN Furosemide.  Advised to drink 64 oz fluid daily to stay hydrated.  He is feeling fine at this time.

## 2021-04-11 NOTE — Progress Notes (Signed)
EPIC Encounter for ICM Monitoring  Patient Name: Chad Christensen is a 55 y.o. male Date: 04/11/2021 Primary Care Physican: Daphine Deutscher Primary Cardiologist:[place] Electrophysiologist: Allred 4/15/2022OfficeWeight: 102lbs.   Attempted call to patient and unable to reach.  Left message to return call. Transmission reviewed.   CorvueThoracic impedancenormal fluid levels but was suggesting dryness from 5/1-5/4 and 4/20-4/27. Patient has Barostim.  Prescribed:Furosemide 40 mg 1 tablet as needed. Spironolactone 25 mg take 1 tablet daily.  Labs: 04/02/2021 Creatinine 1.03, BUN 13, Potassium 4.1, Sodium 140, GFR >60 03/21/2021 Creatinine 0.93, BUN 8,   Potassium 4.1, Sodium 137, GFR >60 02/05/2021 Creatinine 0.85, BUN 7,   Potassium 4.7, Sodium 140, GFR 102 01/21/2021 Creatinine0.86, BUN11, Potassium4.0, Sodium139, GFR>60  12/05/2020 Creatinine0.94, BUN16, Potassium4.1, Sodium138, GFR>60  10/09/2020 Creatinine0.88, BUN13, Potassium4.2, Sodium139 A complete set of results can be found in Results Review.  Recommendations: Unable to reach.    Follow-up plan: ICM clinic phone appointment on6/13/2022. 91 day device clinic remote transmission 05/29/2021.   EP/Cardiology Office Visits: 06/06/2021 with Dr [name]. 7/18/2022with HF clinic PA/NP.    Copy of ICM check sent to Dr.Allred  3 month ICM trend: 04/10/2021.    1 Year ICM trend:       Karie Soda, RN 04/11/2021 9:08 AM

## 2021-04-23 ENCOUNTER — Other Ambulatory Visit: Payer: Self-pay | Admitting: *Deleted

## 2021-04-23 DIAGNOSIS — Z006 Encounter for examination for normal comparison and control in clinical research program: Secondary | ICD-10-CM

## 2021-04-23 DIAGNOSIS — I5022 Chronic systolic (congestive) heart failure: Secondary | ICD-10-CM

## 2021-04-23 NOTE — Progress Notes (Signed)
Orders only

## 2021-05-15 ENCOUNTER — Other Ambulatory Visit (HOSPITAL_COMMUNITY): Payer: Self-pay | Admitting: *Deleted

## 2021-05-15 MED ORDER — AMIODARONE HCL 200 MG PO TABS: 100.0000 mg | ORAL_TABLET | Freq: Every day | ORAL | 6 refills | Status: AC

## 2021-05-19 ENCOUNTER — Ambulatory Visit (INDEPENDENT_AMBULATORY_CARE_PROVIDER_SITE_OTHER): Payer: Medicare Other

## 2021-05-19 DIAGNOSIS — Z95810 Presence of automatic (implantable) cardiac defibrillator: Secondary | ICD-10-CM

## 2021-05-19 DIAGNOSIS — I5022 Chronic systolic (congestive) heart failure: Secondary | ICD-10-CM | POA: Diagnosis not present

## 2021-05-20 NOTE — Progress Notes (Signed)
EPIC Encounter for ICM Monitoring  Patient Name: Chad Christensen is a 55 y.o. male Date: 05/20/2021 Primary Care Physican: Daphine Deutscher Primary Cardiologist: Shirlee Latch Electrophysiologist: Allred 03/21/2021 Office Weight: 102 lbs.     Spoke with patient and reports feeling well at this time. Heart failure questions reviewed. Pt asymptomatic.  He said he may not be drinking enough fluids.   Encouraged to drink 64 oz a day to stay hydrated.  Denies any dehydration symptoms.   Corvue Thoracic impedance normal fluid levels but was suggesting dryness for past 2 days.  Pt has Bartostim implant.   Prescribed: Furosemide 40 mg 1 tablet as needed. Spironolactone 25 mg take 1 tablet daily.   Labs: 04/02/2021 Creatinine 1.03, BUN 13, Potassium 4.1, Sodium 140, GFR >60 03/21/2021 Creatinine 0.93, BUN 8,   Potassium 4.1, Sodium 137, GFR >60 02/05/2021 Creatinine 0.85, BUN 7,   Potassium 4.7, Sodium 140, GFR 102 01/21/2021 Creatinine 0.86, BUN 11, Potassium 4.0, Sodium 139, GFR >60 12/05/2020 Creatinine 0.94, BUN 16, Potassium 4.1, Sodium 138, GFR >60 10/09/2020 Creatinine 0.88, BUN 13, Potassium 4.2, Sodium 139  A complete set of results can be found in Results Review.   Recommendations:  Encouraged to increaese fluid intake today and tomorrow.      Follow-up plan: ICM clinic phone appointment on 06/23/2021.   91 day device clinic remote transmission 05/29/2021.     EP/Cardiology Office Visits:  06/06/2021 with Dr [name]. 06/23/2021 with HF clinic PA/NP.     Copy of ICM check sent to Dr. [name].   3 month ICM trend: 05/19/2021.    1 Year ICM trend:       Karie Soda, RN 05/20/2021 1:40 PM

## 2021-05-29 ENCOUNTER — Ambulatory Visit (INDEPENDENT_AMBULATORY_CARE_PROVIDER_SITE_OTHER): Payer: Medicare Other

## 2021-05-29 DIAGNOSIS — I428 Other cardiomyopathies: Secondary | ICD-10-CM

## 2021-05-29 LAB — CUP PACEART REMOTE DEVICE CHECK
Battery Remaining Longevity: 121 mo
Battery Remaining Percentage: 95.5 %
Battery Voltage: 3.07 V
Brady Statistic RV Percent Paced: 1 %
Date Time Interrogation Session: 20220623020028
HighPow Impedance: 73 Ohm
Implantable Lead Implant Date: 20110909
Implantable Lead Location: 753860
Implantable Lead Model: 7122
Implantable Pulse Generator Implant Date: 20220324
Lead Channel Impedance Value: 1225 Ohm
Lead Channel Pacing Threshold Amplitude: 1.25 V
Lead Channel Pacing Threshold Pulse Width: 0.8 ms
Lead Channel Sensing Intrinsic Amplitude: 9.5 mV
Lead Channel Setting Pacing Amplitude: 2.5 V
Lead Channel Setting Pacing Pulse Width: 0.8 ms
Lead Channel Setting Sensing Sensitivity: 0.5 mV
Pulse Gen Serial Number: 810021943

## 2021-06-06 ENCOUNTER — Other Ambulatory Visit: Payer: Self-pay | Admitting: *Deleted

## 2021-06-06 ENCOUNTER — Encounter: Payer: Medicare Other | Admitting: Internal Medicine

## 2021-06-06 DIAGNOSIS — Z006 Encounter for examination for normal comparison and control in clinical research program: Secondary | ICD-10-CM

## 2021-06-17 NOTE — Progress Notes (Signed)
Remote ICD transmission.   

## 2021-06-20 NOTE — Progress Notes (Signed)
PCP: Betti Cruz, PA-C EP: Dr. [name] Cardiology: Dr. Shirlee Latch  55 y.o. with history of nonischemic dilated cardiomyopathy and paroxysmal atrial fibrillation presents for followup of CHF.  Patient lives in [place], [place].  He gets sporadic cardiology care as it is hard for him to get a ride to [place] and he does not want to see any of the cardiologists in [place]. He is followed by Dr. [name] for his [facility] ICD.  I saw him initially in 3/19, when he was referred by Dr. [name] for left/right heart cath.  This showed no coronary disease and low but not markedly low cardiac output (CI 2.22).  Echo in 3/19 showed a severely dilated LV with EF 20%.    Echo in 6/21 showed EF <20%, severe LV dilation, normal RV size with mildly decreased systolic function, IVC normal.  RHC was done in 6/21, showing low filling pressures and CI 2.38 by Fick/2.68 by Thermo.  CPX in 6/21 showed both severe deconditioning and severe HF limitation.  In 7/21, he had barostimulation activation therapy placed. Echo in 12/21 showed EF <20%, mild LV dilation, normal RV size and systolic function, IVC normal.   He returned 4/22 for followup of CHF. Weight stable.  He increased Entresto to 49/51 bid but this made him dizzy. He stopped it altogether (rather than decreasing to the lower dose).  He was not lightheaded in the past when on lower dose Entresto.  No dyspnea walking on flat ground, just weakness.  He rides his exercise bike.     Today he returns for HF follow up. Overall feeling poor. He walks with a cane chronically due to arthritis (neck and back pain, has had back surgery) and poor balance with prior CVA.  He was unable to do a CPX.  He is still very weak, very frustrated with progressively getting weaker. He feels he does not have the energy to even walk much anymore. He is having some dizziness. Denies increasing SOB, CP, edema, or PND/Orthopnea. Appetite ok. No fever or chills. Weight slowly drifting down despite  drinking Ensure and eating 3 meals a day. Taking all medications. Doing PT 2x/week.  ECG (personally reviewed): SR, IVCD 148 ms (there is artifact from Barostim).  Labs (1/20): digoxin 0.6, K 4.4, creatinine 0.76 Labs (6/20): digoxin 0.6, K 4.9, creatinine 1.05 Labs (6/21): LFTs normal Labs (7/21): K 4.6, creatinine 0.95 => 1.04 Labs (10/21): digoxin 0.9 Labs (11/21): K 4.2, creatinine 0.88 Labs (12/21): K 4.1, creatinine 0.94, TSH normal, digoxin 1.0, LFTs normal Labs (2/22): hgb 14.7 Labs (3/22): K 4.7, creatinine 0.85 Labs (4/22): K 4.1, creatinine 1.03  PMH: 1. H/o VT 2. H/o GI bleeding 3. Colon polyps 4. Remote substance abuse. 5. HTN 6. Gout 7. Atrial fibrillation: Paroxysmal.  8. ?COPD: Smokes cigars.  9. Chronic systolic CHF: Nonischemic cardiomyopathy.  St Jude ICD.  - Echo (3/19): EF 20%, severe LV dilation, diffuse hypokinesis, mild-moderate MR, low normal RV function.  - LHC/RHC (3/19): No CAD; mean RA 2, PA 24/8, mean PCWP 13, CI 2.2.  - Echo (6/21): EF <20%, severe LV dilation, normal RV size with mildly decreased systolic function, IVC normal.  - RHC (6/21): mean RA 1, PA 16/3, mean PCWP 3, CI 2.38 Fick/2.68 thermo.   - CPX (6/21): peak VO2 10.7, RER 0.85 (submaximal), VE/VCO2 slope 80 => severe deconditioning and severe HF limitation.  PFTs were restrictive.  - Barostimulator implantation (7/21).  - Echo (12/21): EF <20%, mild LV dilation, normal RV size  and systolic function, IVC normal.  10. Prior CVA: Likely related to atrial fibrillation.  - Carotid dopplers (12/21): Normal.  11. Back surgery.   Social History   Socioeconomic History   Marital status: Single    Spouse name: Not on file   Number of children: Not on file   Years of education: Not on file   Highest education level: Not on file  Occupational History   Occupation: Disability  Tobacco Use   Smoking status: Former    Packs/day: 0.30    Years: 25.00    Pack years: 7.50    Types: Cigars,  Cigarettes    Start date: 09/13/1982    Quit date: 04/2020    Years since quitting: 1.2   Smokeless tobacco: Never  Vaping Use   Vaping Use: Never used  Substance and Sexual Activity   Alcohol use: Not Currently    Alcohol/week: 2.0 - 3.0 standard drinks    Types: 2 - 3 Cans of beer per week   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   Went to [employer] for 2.5 years.   Social Determinants of Corporate investment banker Strain: Not on file  Food Insecurity: Not on file  Transportation Needs: Unmet Transportation Needs   Lack of Transportation (Medical): Yes   Lack of Transportation (Non-Medical): No  Physical Activity: Not on file  Stress: Not on file  Social Connections: Not on file  Intimate Partner Violence: Not on file   Family History  Problem Relation Age of Onset   Cardiomyopathy Brother        Dilated   ROS: All systems reviewed and negative except as per HPI.   Current Outpatient Medications  Medication Sig Dispense Refill   acetaminophen (TYLENOL) 325 MG tablet Take 650 mg by mouth every 6 (six) hours as needed for moderate pain.     amiodarone (PACERONE) 200 MG tablet Take 0.5 tablets (100 mg total) by mouth daily. 15 tablet 6   clonazePAM (KLONOPIN) 0.5 MG tablet Take 0.5 mg by mouth 3 (three) times daily.     COLCRYS 0.6 MG tablet Take 0.6 mg by mouth 2 (two) times daily as needed (for gout flare up).      Cyanocobalamin (B-12 PO) Take 1 tablet by mouth daily.     dapagliflozin propanediol (FARXIGA) 10 MG TABS tablet Take 1 tablet (10 mg total) by mouth daily before breakfast. 90 tablet 3   digoxin (LANOXIN) 0.125 MG tablet Take 0.5 tablets (0.0625 mg total) by mouth daily. 90 tablet 2   furosemide (LASIX) 40 MG tablet Take 40 mg by mouth daily as needed for fluid.      mirtazapine (REMERON) 15 MG  tablet Take 15 mg by mouth at bedtime.     nystatin (MYCOSTATIN) 100000 UNIT/ML suspension Take 5 mLs by mouth daily as needed (thrush).     pramipexole (MIRAPEX) 0.125 MG tablet Take 0.125 mg by mouth 3 (three) times daily as needed (restless leg).      rivaroxaban (XARELTO) 20 MG TABS tablet Take 20 mg by mouth daily with supper.     sacubitril-valsartan (ENTRESTO) 24-26 MG Take 1 tablet by mouth 2 (two) times daily. (Patient taking differently: Take 1 tablet by mouth daily.) 60 tablet 3   simvastatin (ZOCOR)  5 MG tablet Take 5 mg by mouth daily.     spironolactone (ALDACTONE) 25 MG tablet Take 1 tablet (25 mg total) by mouth daily. *Patient is overdue for an appointment and needs to call and schedule for further refills* 15 tablet 0   No current facility-administered medications for this encounter.   BP 90/62   Pulse 74   Wt 44.8 kg (98 lb 12.8 oz)   SpO2 97%   BMI 13.40 kg/m   Wt Readings from Last 3 Encounters:  06/23/21 44.8 kg (98 lb 12.8 oz)  03/21/21 46.6 kg (102 lb 12.8 oz)  02/27/21 45.8 kg (101 lb)   General:  NAD. No resp difficulty, cachectic HEENT: Normal Neck: Supple. No JVD. Carotids 2+ bilat; no bruits. No lymphadenopathy or thryomegaly appreciated. Cor: PMI nondisplaced. Regular rate & rhythm. No rubs, gallops or murmurs. Lungs: Clear Abdomen: thin, nontender, nondistended. No hepatosplenomegaly. No bruits or masses. Good bowel sounds. Extremities: No cyanosis, clubbing, rash, edema Neuro: Alert & oriented x 3, cranial nerves grossly intact. Moves all 4 extremities w/o difficulty. Affect pleasant.  Assessment/Plan: 1. Chronic systolic CHF: Echo 6/21 with EF <20%, severe LV dilation, normal RV size with mildly decreased systolic function, IVC normal.  LHC/RHC in 3/20 with no coronary disease, relatively preserved cardiac output.  Nonischemic cardiomyopathy with St Jude ICD.  Probably not CRT candidate, has IVCD but not classic LBBB. RHC in 6/21 showed low filling  pressures and preserved cardiac output, but CPX (submaximal) in 6/21 showed severe limitation due to HF as well as severe limitation due to deconditioning. He is now s/p barostimulation activation therapy.  Echo in 12/21 showed EF still < 20% with normal RV.  He is not volume overloaded on exam.  NYHA class III symptoms, still with a lot of fatigue/weakness.   - He has not needed Lasix.  - Continue digoxin 0.125 daily, check level today.  - Continue spironolactone 25 mg daily.  - Continue dapagliflozin 10 mg daily.  - Hold Entresto for now with BP 90s. ? If he is dehydrated.  May be able to re-challenge - Deconditioned and cachectic, needs to be in better physical shape prior to any consideration of advanced therapies.  Cardiac output was not low on last RHC.  He has been drinking Ensure.  I would like him to do cardiac rehab but he does not have transportation. He is working with PT 2x/week. - Will have Research RN see if we can adjust his Barostim device setting to help with his weakness & fatigue. 2. Smoking/?COPD: Patient has quit smoking.  Restrictive PFTs with 6/21 CPX.  3. H/o VT: He is on amiodarone.  - Check LFTs/TSH ok (4/22). He should get a regular eye exam.  - Continue amiodarone 100 mg daily.  4. Atrial fibrillation: Paroxysmal. NSR today.  - Continue Xarelto. No bleeding. CBC today.  - Continue amiodarone.  5. FTT; Body mass index is 13.4 kg/m. - He says he eats large meals during the day, but I wonder if he is over-estimating his caloric intake. Encouraged Ensures and to make sure he gets protein and fat with each meal.  Followup 2 months w/ Dr. Shirlee Latch.   [facility] FNP 06/23/2021

## 2021-06-23 ENCOUNTER — Ambulatory Visit (INDEPENDENT_AMBULATORY_CARE_PROVIDER_SITE_OTHER): Payer: Medicare Other

## 2021-06-23 ENCOUNTER — Ambulatory Visit (HOSPITAL_BASED_OUTPATIENT_CLINIC_OR_DEPARTMENT_OTHER)
Admission: RE | Admit: 2021-06-23 | Discharge: 2021-06-23 | Disposition: A | Discharge status: Home / Self Care | Payer: Medicare Other | Source: Ambulatory Visit | Attending: Internal Medicine | Admitting: Internal Medicine

## 2021-06-23 ENCOUNTER — Ambulatory Visit (HOSPITAL_COMMUNITY)
Admission: RE | Admit: 2021-06-23 | Discharge: 2021-06-23 | Disposition: A | Discharge status: Home / Self Care | Payer: Medicare Other | Source: Ambulatory Visit | Attending: Family Medicine | Admitting: Family Medicine

## 2021-06-23 ENCOUNTER — Encounter (HOSPITAL_COMMUNITY): Payer: Self-pay

## 2021-06-23 ENCOUNTER — Ambulatory Visit (HOSPITAL_COMMUNITY)
Admission: RE | Admit: 2021-06-23 | Discharge: 2021-06-23 | Disposition: A | Discharge status: Home / Self Care | Payer: Medicare Other | Source: Ambulatory Visit | Attending: Internal Medicine | Admitting: Internal Medicine

## 2021-06-23 ENCOUNTER — Other Ambulatory Visit: Payer: Self-pay

## 2021-06-23 ENCOUNTER — Encounter: Payer: Medicare Other | Admitting: *Deleted

## 2021-06-23 VITALS — BP 114/75 | HR 73 | Wt 98.1 lb

## 2021-06-23 VITALS — BP 90/62 | HR 74 | Wt 98.8 lb

## 2021-06-23 DIAGNOSIS — Z7984 Long term (current) use of oral hypoglycemic drugs: Secondary | ICD-10-CM | POA: Insufficient documentation

## 2021-06-23 DIAGNOSIS — Z79899 Other long term (current) drug therapy: Secondary | ICD-10-CM | POA: Diagnosis not present

## 2021-06-23 DIAGNOSIS — Z87891 Personal history of nicotine dependence: Secondary | ICD-10-CM | POA: Diagnosis not present

## 2021-06-23 DIAGNOSIS — Z09 Encounter for follow-up examination after completed treatment for conditions other than malignant neoplasm: Secondary | ICD-10-CM | POA: Insufficient documentation

## 2021-06-23 DIAGNOSIS — I5022 Chronic systolic (congestive) heart failure: Secondary | ICD-10-CM

## 2021-06-23 DIAGNOSIS — I472 Ventricular tachycardia, unspecified: Secondary | ICD-10-CM

## 2021-06-23 DIAGNOSIS — I110 Hypertensive heart disease with heart failure: Secondary | ICD-10-CM | POA: Diagnosis not present

## 2021-06-23 DIAGNOSIS — F17200 Nicotine dependence, unspecified, uncomplicated: Secondary | ICD-10-CM

## 2021-06-23 DIAGNOSIS — M1909 Primary osteoarthritis, other specified site: Secondary | ICD-10-CM | POA: Insufficient documentation

## 2021-06-23 DIAGNOSIS — R531 Weakness: Secondary | ICD-10-CM | POA: Insufficient documentation

## 2021-06-23 DIAGNOSIS — Z006 Encounter for examination for normal comparison and control in clinical research program: Secondary | ICD-10-CM

## 2021-06-23 DIAGNOSIS — Z8673 Personal history of transient ischemic attack (TIA), and cerebral infarction without residual deficits: Secondary | ICD-10-CM | POA: Insufficient documentation

## 2021-06-23 DIAGNOSIS — Z95810 Presence of automatic (implantable) cardiac defibrillator: Secondary | ICD-10-CM

## 2021-06-23 DIAGNOSIS — R42 Dizziness and giddiness: Secondary | ICD-10-CM | POA: Insufficient documentation

## 2021-06-23 DIAGNOSIS — Z7901 Long term (current) use of anticoagulants: Secondary | ICD-10-CM | POA: Diagnosis not present

## 2021-06-23 DIAGNOSIS — I480 Paroxysmal atrial fibrillation: Secondary | ICD-10-CM | POA: Insufficient documentation

## 2021-06-23 DIAGNOSIS — I428 Other cardiomyopathies: Secondary | ICD-10-CM | POA: Insufficient documentation

## 2021-06-23 DIAGNOSIS — R627 Adult failure to thrive: Secondary | ICD-10-CM

## 2021-06-23 LAB — ECHOCARDIOGRAM COMPLETE
Area-P 1/2: 3.05 cm2
Calc EF: 23.1 %
S' Lateral: 5.7 cm
Single Plane A2C EF: 23.4 %
Single Plane A4C EF: 22.7 %
Weight: 1580.8 oz

## 2021-06-23 LAB — BASIC METABOLIC PANEL
Anion gap: 10 (ref 5–15)
BUN: 11 mg/dL (ref 6–20)
CO2: 26 mmol/L (ref 22–32)
Calcium: 9.6 mg/dL (ref 8.9–10.3)
Chloride: 102 mmol/L (ref 98–111)
Creatinine, Ser: 0.96 mg/dL (ref 0.61–1.24)
GFR, Estimated: >60 mL/min (ref >60)
Glucose, Bld: 80 mg/dL (ref 70–99)
Potassium: 3.8 mmol/L (ref 3.5–5.1)
Sodium: 138 mmol/L (ref 135–145)

## 2021-06-23 LAB — CBC
HCT: 42.3 % (ref 39.0–52.0)
Hemoglobin: 13.9 g/dL (ref 13.0–17.0)
MCH: 32.7 pg (ref 26.0–34.0)
MCHC: 32.9 g/dL (ref 30.0–36.0)
MCV: 99.5 fL (ref 80.0–100.0)
Platelets: 237 10*3/uL (ref 150–400)
RBC: 4.25 MIL/uL (ref 4.22–5.81)
RDW: 12.5 % (ref 11.5–15.5)
WBC: 10.8 10*3/uL — ABNORMAL HIGH (ref 4.0–10.5)
nRBC: 0 % (ref 0.0–0.2)

## 2021-06-23 LAB — DIGOXIN LEVEL: Digoxin Level: 0.7 ng/mL — ABNORMAL LOW (ref 0.8–2.0)

## 2021-06-23 NOTE — Progress Notes (Signed)
[facility] pt 009 1 year follow up.

## 2021-06-23 NOTE — Patient Instructions (Signed)
STOP [employer] today We will only contact you if something comes back abnormal or we need to make some changes. Otherwise no news is good news!  Your physician recommends that you schedule a follow-up appointment in: 2-3 months with Dr Shirlee Latch  Do the following things EVERYDAY: Weigh yourself in the morning before breakfast. Write it down and keep it in a log. Take your medicines as prescribed Eat low salt foods--Limit salt (sodium) to 2000 mg per day.  Stay as active as you can everyday Limit all fluids for the day to less than 2 liters   milAt the [facility], you and your health needs are our priority. As part of our continuing mission to provide you with exceptional heart care, we have created designated Provider Care Teams. These Care Teams include your primary Cardiologist (physician) and Advanced Practice Providers (APPs- Physician Assistants and Nurse Practitioners) who all work together to provide you with the care you need, when you need it.   You may see any of the following providers on your designated Care Team at your next follow up: Dr Arvilla Meres Dr Marca Ancona Dr Brandon Melnick, NP Robbie Lis, [place] Mikki Santee Karle Plumber, PharmD   Please be sure to bring in all your medications bottles to every appointment.   If you have any questions or concerns before your next appointment please send [place] a message through [place] or call our office at [phone].    TO LEAVE A MESSAGE FOR THE NURSE SELECT OPTION 2, PLEASE LEAVE A MESSAGE INCLUDING: YOUR NAME DATE OF BIRTH CALL BACK NUMBER REASON FOR CALL**this is important as we prioritize the call backs  YOU WILL RECEIVE A CALL BACK THE SAME DAY AS LONG AS YOU CALL BEFORE 4:00 PM

## 2021-06-24 NOTE — Research (Signed)
[facility] Follow-up 12 Month  Patient reported that his appetite is good, he is eating 4 meals a day but is losing weight.  Paitent educated more about the type of meals he should consume.  Whole meals versus a banana just as a meal. Patient stated that he is drinking a lot of tea and regular soda but very little water.  Educated patient that he should try to increase his water inake versus tea or soda.  Patient stated that he is drinking Ensure 3X a day between meals but that they are expensive.  We discussed other options for protein such as premire protein shakes.  Patient is currently receiving therapy.  He is currently not walking using a wheelchair to get around or walker briefly.  Patient was instructed to stop Entersto.     Section A:  Administrative Section  Subject ID: __[id number]__ - __009___ - 015 Subject Initials: _B__ _W__ _E__  Visit Interval:    []  Screening/Baseline    []  Activation   []  0.5 Month       []  1 Month     []  2 Month     []  3 Month       []  6 Month     [x]  12 Month         []  Unscheduled, reason for visit: _________________________________________  Section B:  Device Information   Battery Battery Voltage: 2.98 V   Battery Life: 59.3 Months   RRT Date: 01-May-2026 (DD/MMM/YYYY)  Lead Impeadance Right Lead ___1080______ Ohms    []  Low    []  High   Left Lead ____ Ohms    []  Low    [x]  High  Programmed Settings Pathway Pulse Width Amplitude Frequency   []  Left 65 7 40   [x]  Right     Section C:  Programming Information (12 Month and Unscheduled - not required)  Date: _18__/_JUL___/__2022___     (DD / MMM / YYYY) Device information, therapy schedule and programmed settings can be found on the session summary report  Site Clinician Present: __Kimberly Ivory Broad :) and Danzig Macgregor RN_______________  [employer] helping with programming: ______Stephanie Sherryle Lis, Dexter O'Steen_______________ []  N/A  Location of CVRx person: [x]  []  Onsite Remote  Subject Experience    1. Did the subject experience transient bradycardia or hypotension during device testing? []  [x]  Yes No   2. Did the subject experience transient electrical stimulation of non-vascular tissues? []  [x]  Yes No   3. If yes to either question above, was intervention beyond reprogramming needed or was it associated with an additional untoward event? []  []  Yes No   4a. Is there any indication that there has been unacceptable device interaction between the CVRx device and other implanted electrical stimulators or sensors device? []  [x]  []  Yes No N/A (no other device)   4b.                   Section D:  Arrythmia Interventions  Has the subject received a cardiac ablation since the last study visit? []  Yes  Date of last procedure:   ____/____/_____ (DD/MMM/YYYY)   [x]  No   []  Unknown   If yes, what type and # of ablations Type []  []  Atrial     []  []  Ventricular    # of procedures: ____________   Was this pre-planned prior to CVRx implant? []  Yes    []  No (complete/update AE form)  Has the subject received a cardioversion since the last study visit? []   Yes  Date of last procedure:   ____/____/_____ (DD/MMM/YYYY)   [x]  No   []  Unknown   If yes, what type and # of cardioversions? Type []  Medications     []  Electrical Shock    # of procedures: _____________   Was this pre-planned prior to CVRx implant? []  Yes    []   No (complete/update AE form)    Section E:  Adverse Events (N/A for Implant Interval)  Have any new adverse events or updates to existing events occurred since last visit? []   Yes (complete/update AE form)   [x]   No  Section F:  Medication Changes   Have there been any changes to the subject's home use medications for Arrhythmia, Antiplatelet/Anticoagulation and Heart failure medications since the last visit? [x]  Yes (update Med form)   []  No  Section G:  Comments                     Section H:  Signature    Person completing form (Print Name):  _____Jessica Atalia Litzinger RN______________________   Signature: Shanda Bumps_Jessica Otto Caraway RN_____ Date: ____18-JUL-21022______________________     Section A:  Administrative Section  Subject ID: _[id number]__- __009_ - 015 Subject Initials: _B__ _W__ _E__  Visit Interval:    []  Screening/Baseline    []  Activation   []  0.5 Month       []  1 Month     []  2 Month     []  3 Month       []  6 Month     [x]  12 Month         []  Unscheduled, reason for visit: _________________________________________  Section B:  Physical Assessment   Date:    _18____/__JUL_____/__2022_____   (DD / MMM / YYYY)  Weight: _____98.12_______  []  kg    [x]  pounds Height (Screening Visit Only): ________  []  cm []  inches  Blood Pressure: _11_  / ___75__mmHg Heart Rate: ____73______ bpm  Section E:  Signature     Person completing form (Print Name): _____Jessica Jamee Keach RN_________________  Signature: _____Jessica Clerance Umland RN________ Date: ___18-JUL-2022________      SECTION A:  Administrative Section  Patient ID: _[id number]_ - __009___ - 015 Patient Initials: _B_ _W_ _E__  Visit Interval:    []  Screening/Baseline*    []  Activation   []  0.5 Month       []  1 Month     []  2 Month     []  3 Month       []  6 Month     [x]  12 Month         []  Unscheduled, reason for visit: _________________________________________  * For Screening / Baseline only the questions are based on 30 days prior to consent.  SECTION B:  COVID-19 Like Illness Symptoms   Has the subject experienced any cold, flu or COVID-19 symptoms since the last study visit? [x]  No  (skip to section C)     []  Yes, date of onset:  _____/_____ MMM/YYYY    If yes, check all symptoms that apply:   []  Fevers or chills   []  New or Worsening Cough  []  Productive  []  Dry    []  If yes to cough, indicate severity  []  Constant  []  Occasional, several per hour   []  New or worsened shortness of breath   []  Diarrhea   []  Altered or reduced sense of smell or taste   []  Muscle aches/Severe Fatigue    []  Chest pain or tightness   []   Sore throat   []  Nausea or vomiting  SECTION C:  COVID-19 Like Illness Testing   Has the patient been tested for COVID-19 since the last study visit? [x]  No     []  Yes, date of test:  _____/_____ MMM/YYYY    If yes, test results:   []  Positive []  Negative []  Unknown  Has the patient been tested for COVID-19 Antibodies since the last study visit? [x]  No     []  Yes, date of test:  _____/_____ MMM/YYYY    If yes, test results:   []  Positive []  Negative []  Unknown  Has the patient been vaccinated for COVID-19 since the last study visit? [x]  No     []  Yes, date of test:  _____/_____ MMM/YYYY  Has the patient been tested for Influenza ("flu") since the last study visit? [x]  No     []  Yes, date of test:  _____/_____ MMM/YYYY    If yes, test results:   []  Positive []  Negative []  Unknown  Has the patient been vaccinated for Influenza ("flu") since the last study visit? [x]  No     []  Yes, date of test:  _____/_____ MMM/YYYY  SECTION D:  COVID-19 Like Illness Exposure  Has the subject been told that they might have had COVID-19/have symptoms suggestive of COVID-19 since the last study visit? [x]  No   []  Yes   []  Unknown  Has the subject been exposed to anyone with known or suspected COVID-19 since the last study visit? [x]  No   []  Yes   []  Unknown  Has the subject been told that they might have had the "flu" or influenza since the last study visit? [x]  No   []  Yes   []  Unknown  SECTION E:  Effects of COVID Pandemic on Subject's Healthcare Interactions  Since the last study visit, did the subject feel the need to go to an emergency department or hospital for their heart failure but decided not to because of concerns about COVID-19? [x]  No   []  Yes   []  Unknown    If yes, how did they seek care (check all that apply):   []  Telemedicine visit []  In-person []  Clinic []  Urgent Care   []  Subject did not change hospital/ER use due to COVID-19 []  Other, specify:  ________________________  Since the last study visit, did the subject have a cardiology/HF related appointment cancelled/rescheduled due to COVID-19 pandemic? [x]  No   []  Yes, how many? ___________   []  Unknown  Since the last study visit, did the subject have any cardiology/HF related telemedicine visit due to COVID-19 pandemic? [x]  No   []  Yes, how many? ___________   []  Unknown  Since the last study visit, did the subject have a cardiology/HF procedure cancelled/rescheduled due to COVID-19 pandemic? [x]  No   []  Yes, how many? ___________   []  Unknown  SECTION F:  Effects of COVID Pandemic on Subject's Medications  Since the last visit, did any of their heart failure medications change or stop, even for a short time?   If yes, enter change into medication eCRF [x]  No   []  Yes, how many? ___________   []  Unknown    If yes, why:   []  Instructed by Doctor []  Self-discontinued []  Unknown  Other: ________________  Since the last visit, was the subject prescribed any medications for COVID-19? [x]  No   []  Yes   []  Unknown    If yes, what medications (generic name):  SECTION G:  Effects of  COVID Pandemic on Subject's Lifestyle  How has the subject's activity/exercise level changed due to COVID-19 pandemic? [x]  No change   []  More activity/exercise   []  Less activity/exercise  How has the subject's smoking habits changed due to COVID-19? []  Does not smoke   [x]  No change   []  Smoke more   []  Smoke less  How has the subject's alcohol drinking habits changed due to COVID-19? []  Does not drink   [x]  No change   []  Drink more   []  Drink less  Section H:  Signature    Person completing form (Print Name): _________Jessica Gates Jividen RN______________  Signature: __Jessica Tatia Petrucci RN______________ Date: __________________________        Section A:  Administrative Section   Subject ID:  _[id number]_ - __009_ - 015  Subject Initials:  _B__ _W__ _E__  Visit Interval:   (*if required)   []  3 Month        []  6 Month       [x]  12 Month       []  Unscheduled*   Section B:  CTA   If the subject had a CTA at baseline, a CTA at 4-month should be performed   Was a CTA performed?  Yes [x]  Not Done   []   CTA Date: __18___/__JUL____/__2022____                           (DD / MMM / )  Images Sent to CVRx    []   Stenosis  Internal Carotid (ICA)  Distal Common Carotid [facility])   Right Side    ___10____%    ___10____%   Left Side    ___10____%    ___10____%   Name of person reading CTA: Dr.  Section C:  CDU   Was a CDU performed?  Yes    [x]     Not Done []   CDU Date: __18___/___JUL____/___2022____                           (DD / MMM / )  Images Sent to CVRx    []   Stenosis  Internal Carotid (ICA)  Distal Common Carotid [facility])   Right Side    ___<15____%    ___<15____%   Left Side    ___<15____%    ___<15____%   Name of person reading CDU:  Dr.  Section D:  New Stenosis   Is there new stenosis noted on CDU or CTA?  [x]  No  []  Yes (Specify side):       []  Left   []  Right  []  Both     If yes is there a greater than 50% stenosis in either artery?  []  No []  Yes (Specify side**):        []  Left   []  Right  []  Both   ** CTA should be completed if ?50% stenosis on the implanted side only at 87-month visit      Section E:  40-month Follow-up Only    Has the subject had new stenosis >40% since baseline in the artery where the lead was implanted?  [x]  No      []  Yes#, please indicate % new stenosis: _______%  (comment below)   Comments on determination of >40% stenosis:   # Subject must be followed after the 22-month visit, please see section 3.4.27 Final Study Visit at 12 Months   Section :  Comments            Section G:  Signature       Person completing form (Print Name): ___Jessica Obaloluwa Delatte RN______________      Signature: ____Jessica Mayley Lish RN________    Date: _____18-JUL-2022_____________________

## 2021-06-24 NOTE — Progress Notes (Signed)
EPIC Encounter for ICM Monitoring  Patient Name: PRESLEY SUMMERLIN is a 55 y.o. male Date: 06/24/2021 Primary Care Physican: Daphine Deutscher Primary Cardiologist: Shirlee Latch Electrophysiologist: Allred 06/23/2021 Office Weight: 98 lbs.     Spoke with patient and reports feeling well at this time. Heart failure questions reviewed. Pt asymptomatic.     Corvue Thoracic impedance normal fluid levels.  Pt has Bartostim implant.   Prescribed: Furosemide 40 mg 1 tablet as needed. Spironolactone 25 mg take 1 tablet daily.   Labs: 04/02/2021 Creatinine 1.03, BUN 13, Potassium 4.1, Sodium 140, GFR >60 03/21/2021 Creatinine 0.93, BUN 8,   Potassium 4.1, Sodium 137, GFR >60 02/05/2021 Creatinine 0.85, BUN 7,   Potassium 4.7, Sodium 140, GFR 102 01/21/2021 Creatinine 0.86, BUN 11, Potassium 4.0, Sodium 139, GFR >60 12/05/2020 Creatinine 0.94, BUN 16, Potassium 4.1, Sodium 138, GFR >60 10/09/2020 Creatinine 0.88, BUN 13, Potassium 4.2, Sodium 139  A complete set of results can be found in Results Review.   Recommendations:  Encouraged to increaese fluid intake today and tomorrow.      Follow-up plan: ICM clinic phone appointment on 07/28/2021.   91 day device clinic remote transmission 08/28/2021.     EP/Cardiology Office Visits:  10/10/2021 with Dr [name]. 09/05/2021 with HF clinic PA/NP.     Copy of ICM check sent to Dr. [name].   3 month ICM trend: 06/23/2021.    1 Year ICM trend:       Karie Soda, RN 06/24/2021 5:07 PM

## 2021-06-25 NOTE — Progress Notes (Signed)
[facility] patient 009 15M visit. Patient aware of results. Patient will follow up with Mardelle Matte as research is completed.

## 2021-06-25 NOTE — Progress Notes (Signed)
[facility] 009 29M visit. Patient will follow up with Mardelle Matte as research is completed. Patient aware of results

## 2021-07-23 NOTE — Research (Signed)
Section A.   Administrative Section  Patient ID: [id number] - [id number] - [id number] Patient Initials: _B_ _W_ _E_  Exit / Termination Date: _18_/_JUL_/_2022_______                                            (DD / MMM / Annamarie Major)  Section B.   Reason for Exit / Termination  []  Screening/baseline failure  []  Failed Implant Attempt(s)  [x]  Completed all Study Required Visits  []  PI Withdrew  []  Subject withdrew consent (add comment below)  []  Subject refuses further follow-up testing (add comment below)  []  Lost to Follow-up  []  System Explanted (Complete Additional Procedure Worksheet)  []  Deceased (Complete Subject Death Worksheet)  []  Other, please specify: ________________________  Comments          Section C. Signature    Person completing form (Print Name): :) _____  Signature: :) ___ Date: __08/17/2022_______________

## 2021-07-28 ENCOUNTER — Ambulatory Visit (INDEPENDENT_AMBULATORY_CARE_PROVIDER_SITE_OTHER): Payer: Medicare Other

## 2021-07-28 DIAGNOSIS — I5022 Chronic systolic (congestive) heart failure: Secondary | ICD-10-CM

## 2021-07-28 DIAGNOSIS — Z95810 Presence of automatic (implantable) cardiac defibrillator: Secondary | ICD-10-CM

## 2021-07-29 NOTE — Progress Notes (Signed)
EPIC Encounter for ICM Monitoring  Patient Name: Chad Christensen is a 55 y.o. male Date: 07/29/2021 Primary Care Physican: Daphine Deutscher Primary Cardiologist: Shirlee Latch Electrophysiologist: Allred 06/23/2021 Office Weight: 98 lbs.     Spoke with patient and reports feeling well at this time. Heart failure questions reviewed. Pt asymptomatic.     Corvue Thoracic impedance normal fluid levels.  Pt has Bartostim implant.   Prescribed: Furosemide 40 mg 1 tablet as needed. Spironolactone 25 mg take 1 tablet daily.   Labs: 06/23/2021 Creatinine 0.96, BUN 11, Potassium 3.8, Sodium 138, GFR >60 04/02/2021 Creatinine 1.03, BUN 13, Potassium 4.1, Sodium 140, GFR >60 03/21/2021 Creatinine 0.93, BUN 8,   Potassium 4.1, Sodium 137, GFR >60 A complete set of results can be found in Results Review.   Recommendations:  No changes and encouraged to call if experiencing any fluid symptoms.   Follow-up plan: ICM clinic phone appointment on 09/08/2021.   91 day device clinic remote transmission 08/28/2021.     EP/Cardiology Office Visits:  10/10/2021 with Dr [name]. 09/05/2021 with HF clinic PA/NP.     Copy of ICM check sent to Dr. [name].    3 month ICM trend: 07/28/2021.    1 Year ICM trend:       Karie Soda, RN 07/29/2021 4:26 PM

## 2021-08-28 ENCOUNTER — Ambulatory Visit (INDEPENDENT_AMBULATORY_CARE_PROVIDER_SITE_OTHER): Payer: Medicare Other

## 2021-08-28 DIAGNOSIS — I428 Other cardiomyopathies: Secondary | ICD-10-CM | POA: Diagnosis not present

## 2021-08-28 LAB — CUP PACEART REMOTE DEVICE CHECK
Battery Remaining Longevity: 119 mo
Battery Remaining Percentage: 94 %
Battery Voltage: 3.04 V
Brady Statistic RV Percent Paced: 1 %
Date Time Interrogation Session: 20220922020046
HighPow Impedance: 77 Ohm
Implantable Lead Implant Date: 20110909
Implantable Lead Location: 753860
Implantable Lead Model: 7122
Implantable Pulse Generator Implant Date: 20220324
Lead Channel Impedance Value: 1175 Ohm
Lead Channel Pacing Threshold Amplitude: 1.25 V
Lead Channel Pacing Threshold Pulse Width: 0.8 ms
Lead Channel Sensing Intrinsic Amplitude: 6.5 mV
Lead Channel Setting Pacing Amplitude: 2.5 V
Lead Channel Setting Pacing Pulse Width: 0.8 ms
Lead Channel Setting Sensing Sensitivity: 0.5 mV
Pulse Gen Serial Number: 810021943

## 2021-09-03 NOTE — Progress Notes (Signed)
Remote ICD transmission.   

## 2021-09-05 ENCOUNTER — Encounter (HOSPITAL_COMMUNITY): Payer: Medicare Other | Admitting: Cardiology

## 2021-09-08 ENCOUNTER — Telehealth: Payer: Self-pay

## 2021-09-08 ENCOUNTER — Ambulatory Visit (INDEPENDENT_AMBULATORY_CARE_PROVIDER_SITE_OTHER): Payer: Medicare Other

## 2021-09-08 DIAGNOSIS — Z95810 Presence of automatic (implantable) cardiac defibrillator: Secondary | ICD-10-CM | POA: Diagnosis not present

## 2021-09-08 DIAGNOSIS — I5022 Chronic systolic (congestive) heart failure: Secondary | ICD-10-CM | POA: Diagnosis not present

## 2021-09-08 NOTE — Progress Notes (Signed)
EPIC Encounter for ICM Monitoring  Patient Name: Chad Christensen is a 55 y.o. male Date: 09/08/2021 Primary Care Physican: Daphine Deutscher Primary Cardiologist: Shirlee Latch Electrophysiologist: Allred 06/23/2021 Office Weight: 98 lbs.     Spoke with patient and heart failure questions reviewed.  Pt asymptomatic for fluid accumulation and feeling well.  He reports drinking excessive amount of oral fluid intake and thinks that may be contributing to possible fluid accumulation.   Corvue Thoracic impedance suggesting possible fluid accumulation starting 10/1.  Pt has Bartostim implant.   Prescribed: Furosemide 40 mg 1 tablet as needed. Spironolactone 25 mg take 1 tablet daily.   Labs: 06/23/2021 Creatinine 0.96, BUN 11, Potassium 3.8, Sodium 138, GFR >60 04/02/2021 Creatinine 1.03, BUN 13, Potassium 4.1, Sodium 140, GFR >60 03/21/2021 Creatinine 0.93, BUN 8,   Potassium 4.1, Sodium 137, GFR >60 A complete set of results can be found in Results Review.   Recommendations:  Advised to limit fluid intake to 64 oz daily.     Follow-up plan: ICM clinic phone appointment on 09/16/2021 to recheck fluid levels.   91 day device clinic remote transmission 11/27/2021.     EP/Cardiology Office Visits:  10/10/2021 with Dr [name]. 11/14/2021 with Dr Shirlee Latch.     Copy of ICM check sent to Dr. [name].   3 month ICM trend: 09/08/2021.    1 Year ICM trend:       Karie Soda, RN 09/08/2021 3:41 PM

## 2021-09-08 NOTE — Telephone Encounter (Signed)
Remote ICM transmission received.  Attempted call to patient regarding ICM remote transmission and left message to return call   

## 2021-09-16 ENCOUNTER — Telehealth (HOSPITAL_COMMUNITY): Payer: Self-pay | Admitting: *Deleted

## 2021-09-16 NOTE — Telephone Encounter (Signed)
[phone]- Dr.Husain  pts cousin Breylin Dom left vm stating pt was admitted to [facility]  and "in trouble" . The hospital needs information about "experimental cardiac implant" pt received from our hospital  Ceily or Val are contacts at the hospital w/ critical care unit [phone].   Dr.Husain also left a vm requesting direct call from Dr.Mclean or provider involved with implant.   Routed to Dr.McLean and Joanell Rising

## 2021-09-18 ENCOUNTER — Telehealth: Payer: Self-pay

## 2021-09-18 NOTE — Telephone Encounter (Signed)
LMOVM for patient to send missed ICM Transmission. 

## 2021-09-21 ENCOUNTER — Inpatient Hospital Stay
Admission: RE | Admit: 2021-09-21 | Discharge: 2021-11-06 | Disposition: E | Payer: Medicare Other | Source: Ambulatory Visit

## 2021-09-21 DIAGNOSIS — R6510 Systemic inflammatory response syndrome (SIRS) of non-infectious origin without acute organ dysfunction: Secondary | ICD-10-CM

## 2021-09-21 DIAGNOSIS — J90 Pleural effusion, not elsewhere classified: Secondary | ICD-10-CM

## 2021-09-21 DIAGNOSIS — D72829 Elevated white blood cell count, unspecified: Secondary | ICD-10-CM

## 2021-09-21 DIAGNOSIS — J9690 Respiratory failure, unspecified, unspecified whether with hypoxia or hypercapnia: Secondary | ICD-10-CM

## 2021-09-21 DIAGNOSIS — R0902 Hypoxemia: Secondary | ICD-10-CM

## 2021-09-21 DIAGNOSIS — Z95828 Presence of other vascular implants and grafts: Secondary | ICD-10-CM

## 2021-09-21 DIAGNOSIS — I509 Heart failure, unspecified: Secondary | ICD-10-CM

## 2021-09-21 DIAGNOSIS — R0989 Other specified symptoms and signs involving the circulatory and respiratory systems: Secondary | ICD-10-CM

## 2021-09-21 DIAGNOSIS — R0602 Shortness of breath: Secondary | ICD-10-CM

## 2021-09-21 DIAGNOSIS — Z4659 Encounter for fitting and adjustment of other gastrointestinal appliance and device: Secondary | ICD-10-CM

## 2021-09-21 DIAGNOSIS — J189 Pneumonia, unspecified organism: Secondary | ICD-10-CM

## 2021-09-21 DIAGNOSIS — R109 Unspecified abdominal pain: Secondary | ICD-10-CM

## 2021-09-21 DIAGNOSIS — R059 Cough, unspecified: Secondary | ICD-10-CM

## 2021-09-22 ENCOUNTER — Other Ambulatory Visit (HOSPITAL_COMMUNITY): Payer: Medicare Other

## 2021-09-22 LAB — CBC WITH DIFFERENTIAL/PLATELET
Abs Immature Granulocytes: 0.12 K/uL — ABNORMAL HIGH (ref 0.00–0.07)
Basophils Absolute: 0 K/uL (ref 0.0–0.1)
Basophils Relative: 0 %
Eosinophils Absolute: 0 K/uL (ref 0.0–0.5)
Eosinophils Relative: 0 %
HCT: 32.7 % — ABNORMAL LOW (ref 39.0–52.0)
Hemoglobin: 10.8 g/dL — ABNORMAL LOW (ref 13.0–17.0)
Immature Granulocytes: 1 %
Lymphocytes Relative: 4 %
Lymphs Abs: 0.6 K/uL — ABNORMAL LOW (ref 0.7–4.0)
MCH: 31.8 pg (ref 26.0–34.0)
MCHC: 33 g/dL (ref 30.0–36.0)
MCV: 96.2 fL (ref 80.0–100.0)
Monocytes Absolute: 0.7 K/uL (ref 0.1–1.0)
Monocytes Relative: 4 %
Neutro Abs: 14.3 K/uL — ABNORMAL HIGH (ref 1.7–7.7)
Neutrophils Relative %: 91 %
Platelets: 227 K/uL (ref 150–400)
RBC: 3.4 MIL/uL — ABNORMAL LOW (ref 4.22–5.81)
RDW: 13.2 % (ref 11.5–15.5)
WBC: 15.8 K/uL — ABNORMAL HIGH (ref 4.0–10.5)
nRBC: 0 % (ref 0.0–0.2)

## 2021-09-22 LAB — COMPREHENSIVE METABOLIC PANEL WITH GFR
ALT: 64 U/L — ABNORMAL HIGH (ref 0–44)
AST: 46 U/L — ABNORMAL HIGH (ref 15–41)
Albumin: 2.1 g/dL — ABNORMAL LOW (ref 3.5–5.0)
Alkaline Phosphatase: 41 U/L (ref 38–126)
Anion gap: 9 (ref 5–15)
BUN: 12 mg/dL (ref 6–20)
CO2: 27 mmol/L (ref 22–32)
Calcium: 8.3 mg/dL — ABNORMAL LOW (ref 8.9–10.3)
Chloride: 104 mmol/L (ref 98–111)
Creatinine, Ser: 0.65 mg/dL (ref 0.61–1.24)
GFR, Estimated: >60 mL/min
Glucose, Bld: 85 mg/dL (ref 70–99)
Potassium: 3.9 mmol/L (ref 3.5–5.1)
Sodium: 140 mmol/L (ref 135–145)
Total Bilirubin: 0.9 mg/dL (ref 0.3–1.2)
Total Protein: 5.3 g/dL — ABNORMAL LOW (ref 6.5–8.1)

## 2021-09-23 LAB — CBC
HCT: 30.5 % — ABNORMAL LOW (ref 39.0–52.0)
Hemoglobin: 10.5 g/dL — ABNORMAL LOW (ref 13.0–17.0)
MCH: 32.3 pg (ref 26.0–34.0)
MCHC: 34.4 g/dL (ref 30.0–36.0)
MCV: 93.8 fL (ref 80.0–100.0)
Platelets: 246 10*3/uL (ref 150–400)
RBC: 3.25 MIL/uL — ABNORMAL LOW (ref 4.22–5.81)
RDW: 13.2 % (ref 11.5–15.5)
WBC: 17.4 10*3/uL — ABNORMAL HIGH (ref 4.0–10.5)
nRBC: 0 % (ref 0.0–0.2)

## 2021-09-23 LAB — BASIC METABOLIC PANEL
Anion gap: 6 (ref 5–15)
BUN: 15 mg/dL (ref 6–20)
CO2: 29 mmol/L (ref 22–32)
Calcium: 8.1 mg/dL — ABNORMAL LOW (ref 8.9–10.3)
Chloride: 104 mmol/L (ref 98–111)
Creatinine, Ser: 0.47 mg/dL — ABNORMAL LOW (ref 0.61–1.24)
GFR, Estimated: >60 mL/min (ref >60)
Glucose, Bld: 122 mg/dL — ABNORMAL HIGH (ref 70–99)
Potassium: 3.7 mmol/L (ref 3.5–5.1)
Sodium: 139 mmol/L (ref 135–145)

## 2021-09-23 LAB — MAGNESIUM: Magnesium: 1.9 mg/dL (ref 1.7–2.4)

## 2021-09-25 LAB — CBC
HCT: 31.6 % — ABNORMAL LOW (ref 39.0–52.0)
Hemoglobin: 10.7 g/dL — ABNORMAL LOW (ref 13.0–17.0)
MCH: 31.8 pg (ref 26.0–34.0)
MCHC: 33.9 g/dL (ref 30.0–36.0)
MCV: 94 fL (ref 80.0–100.0)
Platelets: 298 10*3/uL (ref 150–400)
RBC: 3.36 MIL/uL — ABNORMAL LOW (ref 4.22–5.81)
RDW: 13.1 % (ref 11.5–15.5)
WBC: 15.3 10*3/uL — ABNORMAL HIGH (ref 4.0–10.5)
nRBC: 0 % (ref 0.0–0.2)

## 2021-09-25 LAB — BASIC METABOLIC PANEL
Anion gap: 9 (ref 5–15)
BUN: 14 mg/dL (ref 6–20)
CO2: 29 mmol/L (ref 22–32)
Calcium: 8.3 mg/dL — ABNORMAL LOW (ref 8.9–10.3)
Chloride: 100 mmol/L (ref 98–111)
Creatinine, Ser: 0.5 mg/dL — ABNORMAL LOW (ref 0.61–1.24)
GFR, Estimated: >60 mL/min (ref >60)
Glucose, Bld: 87 mg/dL (ref 70–99)
Potassium: 3.9 mmol/L (ref 3.5–5.1)
Sodium: 138 mmol/L (ref 135–145)

## 2021-09-26 ENCOUNTER — Other Ambulatory Visit (HOSPITAL_COMMUNITY): Payer: Medicare Other

## 2021-09-27 LAB — BLOOD GAS, ARTERIAL
Acid-Base Excess: 3.6 mmol/L — ABNORMAL HIGH (ref 0.0–2.0)
Bicarbonate: 26.4 mmol/L (ref 20.0–28.0)
FIO2: 50
O2 Saturation: 99 %
Patient temperature: 36.5
pCO2 arterial: 31.5 mmHg — ABNORMAL LOW (ref 32.0–48.0)
pH, Arterial: 7.531 — ABNORMAL HIGH (ref 7.350–7.450)
pO2, Arterial: 137 mmHg — ABNORMAL HIGH (ref 83.0–108.0)

## 2021-09-28 ENCOUNTER — Other Ambulatory Visit (HOSPITAL_COMMUNITY): Payer: Medicare Other

## 2021-09-28 LAB — CBC
HCT: 34.9 % — ABNORMAL LOW (ref 39.0–52.0)
Hemoglobin: 11.6 g/dL — ABNORMAL LOW (ref 13.0–17.0)
MCH: 31.4 pg (ref 26.0–34.0)
MCHC: 33.2 g/dL (ref 30.0–36.0)
MCV: 94.3 fL (ref 80.0–100.0)
Platelets: 454 10*3/uL — ABNORMAL HIGH (ref 150–400)
RBC: 3.7 MIL/uL — ABNORMAL LOW (ref 4.22–5.81)
RDW: 12.8 % (ref 11.5–15.5)
WBC: 23.8 10*3/uL — ABNORMAL HIGH (ref 4.0–10.5)
nRBC: 0 % (ref 0.0–0.2)

## 2021-09-28 LAB — LACTIC ACID, PLASMA
Lactic Acid, Venous: 1.6 mmol/L (ref 0.5–1.9)
Lactic Acid, Venous: 1.4 mmol/L (ref 0.5–1.9)

## 2021-09-28 LAB — URINALYSIS, ROUTINE W REFLEX MICROSCOPIC
Bacteria, UA: NONE SEEN
Bilirubin Urine: NEGATIVE
Glucose, UA: NEGATIVE mg/dL
Ketones, ur: NEGATIVE mg/dL
Leukocytes,Ua: NEGATIVE
Nitrite: NEGATIVE
Protein, ur: NEGATIVE mg/dL
RBC / HPF: >50 RBC/hpf — ABNORMAL HIGH (ref 0–5)
Specific Gravity, Urine: 1.017 (ref 1.005–1.030)
pH: 6 (ref 5.0–8.0)

## 2021-09-29 ENCOUNTER — Other Ambulatory Visit (HOSPITAL_COMMUNITY): Payer: Medicare Other

## 2021-09-29 LAB — COMPREHENSIVE METABOLIC PANEL
ALT: 25 U/L (ref 0–44)
AST: 19 U/L (ref 15–41)
Albumin: 2.1 g/dL — ABNORMAL LOW (ref 3.5–5.0)
Alkaline Phosphatase: 45 U/L (ref 38–126)
Anion gap: 9 (ref 5–15)
BUN: 21 mg/dL — ABNORMAL HIGH (ref 6–20)
CO2: 25 mmol/L (ref 22–32)
Calcium: 8.4 mg/dL — ABNORMAL LOW (ref 8.9–10.3)
Chloride: 100 mmol/L (ref 98–111)
Creatinine, Ser: 0.58 mg/dL — ABNORMAL LOW (ref 0.61–1.24)
GFR, Estimated: >60 mL/min (ref >60)
Glucose, Bld: 104 mg/dL — ABNORMAL HIGH (ref 70–99)
Potassium: 3.5 mmol/L (ref 3.5–5.1)
Sodium: 134 mmol/L — ABNORMAL LOW (ref 135–145)
Total Bilirubin: 0.8 mg/dL (ref 0.3–1.2)
Total Protein: 6.4 g/dL — ABNORMAL LOW (ref 6.5–8.1)

## 2021-09-29 LAB — URINE CULTURE: Culture: NO GROWTH

## 2021-09-29 LAB — CBC
HCT: 30.1 % — ABNORMAL LOW (ref 39.0–52.0)
Hemoglobin: 10.2 g/dL — ABNORMAL LOW (ref 13.0–17.0)
MCH: 31.8 pg (ref 26.0–34.0)
MCHC: 33.9 g/dL (ref 30.0–36.0)
MCV: 93.8 fL (ref 80.0–100.0)
Platelets: 430 10*3/uL — ABNORMAL HIGH (ref 150–400)
RBC: 3.21 MIL/uL — ABNORMAL LOW (ref 4.22–5.81)
RDW: 12.8 % (ref 11.5–15.5)
WBC: 15.2 10*3/uL — ABNORMAL HIGH (ref 4.0–10.5)
nRBC: 0 % (ref 0.0–0.2)

## 2021-09-29 LAB — LACTIC ACID, PLASMA: Lactic Acid, Venous: 1.2 mmol/L (ref 0.5–1.9)

## 2021-09-30 LAB — BASIC METABOLIC PANEL
Anion gap: 10 (ref 5–15)
BUN: 24 mg/dL — ABNORMAL HIGH (ref 6–20)
CO2: 22 mmol/L (ref 22–32)
Calcium: 8.5 mg/dL — ABNORMAL LOW (ref 8.9–10.3)
Chloride: 103 mmol/L (ref 98–111)
Creatinine, Ser: 0.56 mg/dL — ABNORMAL LOW (ref 0.61–1.24)
GFR, Estimated: >60 mL/min (ref >60)
Glucose, Bld: 88 mg/dL (ref 70–99)
Potassium: 3.7 mmol/L (ref 3.5–5.1)
Sodium: 135 mmol/L (ref 135–145)

## 2021-09-30 LAB — CBC
HCT: 30.9 % — ABNORMAL LOW (ref 39.0–52.0)
Hemoglobin: 10.2 g/dL — ABNORMAL LOW (ref 13.0–17.0)
MCH: 31.4 pg (ref 26.0–34.0)
MCHC: 33 g/dL (ref 30.0–36.0)
MCV: 95.1 fL (ref 80.0–100.0)
Platelets: 420 10*3/uL — ABNORMAL HIGH (ref 150–400)
RBC: 3.25 MIL/uL — ABNORMAL LOW (ref 4.22–5.81)
RDW: 12.9 % (ref 11.5–15.5)
WBC: 12.2 10*3/uL — ABNORMAL HIGH (ref 4.0–10.5)
nRBC: 0 % (ref 0.0–0.2)

## 2021-09-30 LAB — EXPECTORATED SPUTUM ASSESSMENT W GRAM STAIN, RFLX TO RESP C

## 2021-09-30 LAB — MAGNESIUM: Magnesium: 2.2 mg/dL (ref 1.7–2.4)

## 2021-10-01 NOTE — Progress Notes (Signed)
No ICM remote transmission received for 09/16/2021 and next ICM transmission scheduled for 10/14/2021.

## 2021-10-02 ENCOUNTER — Other Ambulatory Visit (HOSPITAL_COMMUNITY): Payer: Medicare Other

## 2021-10-02 LAB — CBC
HCT: 27 % — ABNORMAL LOW (ref 39.0–52.0)
Hemoglobin: 9.1 g/dL — ABNORMAL LOW (ref 13.0–17.0)
MCH: 30.8 pg (ref 26.0–34.0)
MCHC: 33.7 g/dL (ref 30.0–36.0)
MCV: 91.5 fL (ref 80.0–100.0)
Platelets: 551 10*3/uL — ABNORMAL HIGH (ref 150–400)
RBC: 2.95 MIL/uL — ABNORMAL LOW (ref 4.22–5.81)
RDW: 13 % (ref 11.5–15.5)
WBC: 13.8 10*3/uL — ABNORMAL HIGH (ref 4.0–10.5)
nRBC: 0 % (ref 0.0–0.2)

## 2021-10-02 LAB — BASIC METABOLIC PANEL
Anion gap: 8 (ref 5–15)
BUN: 27 mg/dL — ABNORMAL HIGH (ref 6–20)
CO2: 23 mmol/L (ref 22–32)
Calcium: 8.8 mg/dL — ABNORMAL LOW (ref 8.9–10.3)
Chloride: 108 mmol/L (ref 98–111)
Creatinine, Ser: 0.67 mg/dL (ref 0.61–1.24)
GFR, Estimated: >60 mL/min (ref >60)
Glucose, Bld: 105 mg/dL — ABNORMAL HIGH (ref 70–99)
Potassium: 2.9 mmol/L — ABNORMAL LOW (ref 3.5–5.1)
Sodium: 139 mmol/L (ref 135–145)

## 2021-10-02 LAB — CULTURE, RESPIRATORY W GRAM STAIN: Culture: NORMAL

## 2021-10-03 ENCOUNTER — Other Ambulatory Visit (HOSPITAL_COMMUNITY): Payer: Medicare Other

## 2021-10-03 LAB — BASIC METABOLIC PANEL
Anion gap: 7 (ref 5–15)
BUN: 22 mg/dL — ABNORMAL HIGH (ref 6–20)
CO2: 23 mmol/L (ref 22–32)
Calcium: 8.8 mg/dL — ABNORMAL LOW (ref 8.9–10.3)
Chloride: 105 mmol/L (ref 98–111)
Creatinine, Ser: 0.54 mg/dL — ABNORMAL LOW (ref 0.61–1.24)
GFR, Estimated: >60 mL/min (ref >60)
Glucose, Bld: 158 mg/dL — ABNORMAL HIGH (ref 70–99)
Potassium: 4.1 mmol/L (ref 3.5–5.1)
Sodium: 135 mmol/L (ref 135–145)

## 2021-10-04 ENCOUNTER — Other Ambulatory Visit (HOSPITAL_BASED_OUTPATIENT_CLINIC_OR_DEPARTMENT_OTHER): Payer: Medicare Other

## 2021-10-04 DIAGNOSIS — I428 Other cardiomyopathies: Secondary | ICD-10-CM

## 2021-10-04 LAB — ECHOCARDIOGRAM COMPLETE
AR max vel: 2.19 cm2
AV Area VTI: 2.38 cm2
AV Area mean vel: 2.18 cm2
AV Mean grad: 4 mmHg
AV Peak grad: 7.8 mmHg
Ao pk vel: 1.4 m/s
Area-P 1/2: 5.46 cm2
MV VTI: 2.34 cm2
S' Lateral: 5.3 cm

## 2021-10-04 NOTE — Consult Note (Signed)
Chief Complaint: Patient was seen in consultation today for percutaneous gastric tube placement at the request of Dr Sheryle Hail   Supervising Physician: Pernell Dupre  Patient Status: Select IP  History of Present Illness: Chad Christensen is a 55 y.o. male   Hx Etoh abuse COPD; Resp Failure Non ischemic cardiomyopathy--AICD On Eliquis- Afib Hx Stroke  Admitted with Resp failure/aspiration pna Failure to thrive Wt loss Deconditioning; dysphagia Protein calorie malnutrition Aspiration high risk  Request made for percutaneous gastric tube placement Imaging reviewed with Dr Mir He has approves procedure  LD Eliquis Sat 10/29 Plan for G tube in IR 11/2 Wed    Past Medical History:  Diagnosis Date   AICD (automatic cardioverter/defibrillator) present    CHF (congestive heart failure) (HCC) 05/1999   COPD (chronic obstructive pulmonary disease) (HCC) 2019   Gout    Hypertension    Paroxysmal atrial fibrillation (HCC)    Polysubstance abuse (HCC)    In remission since 2003   Renal insufficiency    Stroke (HCC) 2018   Systolic heart failure    Secondary to nonischemic cardiomyopathy   Tobacco user    Ventricular tachycardia (HCC) 04/23/12   CL with appropriate ICD shock delivered    Past Surgical History:  Procedure Laterality Date   BAROREFLEX LEAD INSERTION N/A 06/07/2020   Procedure: BAROREFLEX LEAD INSERTION;  Surgeon: Hillis Range, MD;  Location: [facility] INVASIVE CV LAB;  Service: Cardiovascular;  Laterality: N/A;   Baroreflexive Activation Therapy Device Right 06/07/2020   CVRx Barostim Neo device model 2102 (SN [id number]) implanted as part of [facility] protocol by Drs Myra Gianotti and Allred   CARDIAC DEFIBRILLATOR PLACEMENT     SJM ICD implanted by [facility] 08/15/10   CERVICAL SPINE SURGERY     CORONARY ANGIOPLASTY     HERNIA REPAIR     Bilateral   ICD GENERATOR CHANGEOUT N/A 02/27/2021   Procedure: ICD GENERATOR CHANGEOUT;  Surgeon: Hillis Range, MD;   Location: [facility] INVASIVE CV LAB;  Service: Cardiovascular;  Laterality: N/A;   LUMBAR SPINE SURGERY     RIGHT HEART CATH N/A 05/15/2020   Procedure: RIGHT HEART CATH;  Surgeon: Laurey Morale, MD;  Location: [facility] INVASIVE CV LAB;  Service: Cardiovascular;  Laterality: N/A;   RIGHT/LEFT HEART CATH AND CORONARY ANGIOGRAPHY N/A 03/03/2018   Procedure: RIGHT/LEFT HEART CATH AND CORONARY ANGIOGRAPHY;  Surgeon: Laurey Morale, MD;  Location: [facility] INVASIVE CV LAB;  Service: Cardiovascular;  Laterality: N/A;   THORACOTOMY  9/09   Left; for rib fracture and associated pneumothorax    Allergies: Morphine and related and Penicillins  Medications: Prior to Admission medications   Medication Sig Start Date End Date Taking? Authorizing Provider  acetaminophen (TYLENOL) 325 MG tablet Take 650 mg by mouth every 6 (six) hours as needed for moderate pain.    [provider]  amiodarone (PACERONE) 200 MG tablet Take 0.5 tablets (100 mg total) by mouth daily. 05/15/21   Laurey Morale, MD  clonazePAM (KLONOPIN) 0.5 MG tablet Take 0.5 mg by mouth 3 (three) times daily.    [provider]  COLCRYS 0.6 MG tablet Take 0.6 mg by mouth 2 (two) times daily as needed (for gout flare up).  03/22/17   [provider]  Cyanocobalamin (B-12 PO) Take 1 tablet by mouth daily.    [provider]  dapagliflozin propanediol (FARXIGA) 10 MG TABS tablet Take 1 tablet (10 mg total) by mouth daily before breakfast. 01/01/21   Shirlee Latch,  Eliot Ford, MD  digoxin (LANOXIN) 0.125 MG tablet Take 0.5 tablets (0.0625 mg total) by mouth daily. 04/03/21   Laurey Morale, MD  furosemide (LASIX) 40 MG tablet Take 40 mg by mouth daily as needed for fluid.     [provider]  mirtazapine (REMERON) 15 MG tablet Take 15 mg by mouth at bedtime.    [provider]  nystatin (MYCOSTATIN) 100000 UNIT/ML suspension Take 5 mLs by mouth daily as needed (thrush). 12/07/19   [provider]   pramipexole (MIRAPEX) 0.125 MG tablet Take 0.125 mg by mouth 3 (three) times daily as needed (restless leg).  02/26/20   [provider]  rivaroxaban (XARELTO) 20 MG TABS tablet Take 20 mg by mouth daily with supper.    [provider]  simvastatin (ZOCOR) 5 MG tablet Take 5 mg by mouth daily.    [provider]  spironolactone (ALDACTONE) 25 MG tablet Take 1 tablet (25 mg total) by mouth daily. *Patient is overdue for an appointment and needs to call and schedule for further refills* 03/08/17   Hillis Range, MD     Family History  Problem Relation Age of Onset   Cardiomyopathy Brother        Dilated    Social History   Socioeconomic History   Marital status: Single    Spouse name: Not on file   Number of children: Not on file   Years of education: Not on file   Highest education level: Not on file  Occupational History   Occupation: Disability  Tobacco Use   Smoking status: Former    Packs/day: 0.30    Years: 25.00    Pack years: 7.50    Types: Cigars, Cigarettes    Start date: 09/13/1982    Quit date: 04/2020    Years since quitting: 1.4   Smokeless tobacco: Never  Vaping Use   Vaping Use: Never used  Substance and Sexual Activity   Alcohol use: Not Currently    Alcohol/week: 2.0 - 3.0 standard drinks    Types: 2 - 3 Cans of beer per week   Drug use: Yes    Types: Marijuana    Comment: reports that he is in remission   Sexual activity: Not Currently  Other Topics Concern   Not on file  Social History Narrative   Lives in [place], [place].   Went to [place] [place] for 2.5 years.   Social Determinants of Corporate investment banker Strain: Not on file  Food Insecurity: Not on file  Transportation Needs: Unmet Transportation Needs   Lack of Transportation (Medical): Yes   Lack of Transportation (Non-Medical): No  Physical Activity: Not on file  Stress: Not on file  Social Connections: Not on file    Review of Systems: A 12 point ROS discussed  and pertinent positives are indicated in the HPI above.  All other systems are negative.  Vital Signs: There were no vitals taken for this visit.  Physical Exam Vitals reviewed.  Constitutional:      Comments: Very thin Ill appearing  HENT:     Mouth/Throat:     Mouth: Mucous membranes are moist.  Cardiovascular:     Rate and Rhythm: Normal rate.  Pulmonary:     Effort: Pulmonary effort is normal.     Breath sounds: Wheezing present.  Abdominal:     Palpations: Abdomen is soft.  Skin:    General: Skin is warm.  Neurological:     Comments:  Tries to communicate Unable to make put his words to me  Psychiatric:     Comments: Spoke to [employer] via phone-- he consents to procedure    Imaging: CT ABDOMEN WO CONTRAST  Result Date: 10/02/2021 CLINICAL DATA:  Dysphagia.  G-tube placement evaluation EXAM: CT ABDOMEN WITHOUT CONTRAST TECHNIQUE: Multidetector CT imaging of the abdomen was performed following the standard protocol without IV contrast. COMPARISON:  KUB, 09/29/2021.  Chest radiograph, 09/29/2021. FINDINGS: Lower chest: Severe obstructive pulmonary disease background. RIGHT basilar dependent consolidation and LEFT basilar nodular pulmonary opacities. Single lead AICD with tip within the RIGHT ventricle. Hepatobiliary: 2.5 cm RIGHT posterior hepatic lobe hypodense lesion, incompletely assessed on this single phase evaluation, though likely a cyst versus hemangioma. Prominence of the LEFT hepatic lobe, draping over the stomach at the LEFT upper quadrant. Contracted gallbladder with radiodense sludge/stones. No biliary dilatation. Pancreas: Normal noncontrast evaluation. No pancreatic duct dilatation. Spleen: Normal in size without focal abnormality. Adrenals/Urinary Tract: Adrenal glands are unremarkable. Kidneys are normal, without renal calculi, focal lesion, or hydronephrosis. Stomach/Bowel: Enteric feeding tube, with tip within stomach. Elongated stomach. Nondistended  small bowel and nondilated colon, within the imaged portions. Vascular/Lymphatic: Hypodense aortic blood pool. Minimal aortic atherosclerosis. No enlarged abdominal lymph nodes. Other: Cachectic.  No abdominal wall hernia or abnormality. Musculoskeletal: No acute osseous findings. IMPRESSION: 1. Prominent LEFT hepatic lobe, draping over the stomach and LEFT upper quadrant. Elongated stomach with NG tube. Perceivable window for percutaneous gastrostomy placement, with final decision-making reserved for procedural physician. 2. RIGHT basilar consolidation and LEFT basilar nodular pulmonary opacities. Findings favored consistent with aspiration pneumonia/pneumonitis. Roanna Banning, MD [facility] Radiology Electronically Signed   By: Roanna Banning M.D.   On: 10/02/2021 08:35   DG Chest Port 1 View  Result Date: 10/03/2021 CLINICAL DATA:  Reason for exam: CHF, PNA, pulmonary vascular congestion Hx of CHF, HTN, stroke EXAM: PORTABLE CHEST - 1 VIEW COMPARISON:  09/29/2021 FINDINGS: Stable left subclavian AICD extending RV apex. Left IJ central venous catheter extends to the cavoatrial junction. Nasogastric tube extends at least as far as the decompressed stomach, tip not seen. Implanted stimulator device over the mid right chest, catheter extending cephalad to the lower right neck soft tissues. Partial interval improvement in the perihilar interstitial and airspace edema or infiltrate. Some worsening of the airspace consolidation in the right lung base. Heart size and mediastinal contours are within normal limits. Blunting of bilateral costophrenic angles suggesting small effusions. No pneumothorax. Visualized bones unremarkable. IMPRESSION: 1. Worsening right lower lobe airspace disease with possible small effusion. 2. Support hardware in good position Electronically Signed   By: Corlis Leak M.D.   On: 10/03/2021 13:49   DG CHEST PORT 1 VIEW  Result Date:  09/29/2021 CLINICAL DATA:  Abdominal pain, system mic inflammatory response. EXAM: PORTABLE CHEST 1 VIEW COMPARISON:  Previous chest x-rays from October 21st and September 22, 2021. FINDINGS: Gastric tube courses through in off the radiograph, tip in the distal stomach based on abdominal radiograph. LEFT IJ central venous catheter terminates at caval to atrial junction. LEFT-sided single lead AICD with lead projecting over cardiac silhouette as before. RIGHT sided nerve stimulator, power pack over the RIGHT mid chest. Patchy opacities throughout the chest are slightly worse perhaps than on September 26, 2021 but general improved since September 22, 2021. Interstitial prominence also with slight increase. Cardiomediastinal contours and hilar structures are stable. No lobar level consolidative process or sign of pleural effusion. On limited  assessment there is no acute skeletal process. IMPRESSION: Mild interstitial prominence and patchy opacities throughout the chest are slightly worse than on September 27, 2019, but improved from September 22, 2021. Findings may represent w findings of atypical infection or asymmetric pulmonary edema. Electronically Signed   By: Donzetta Kohut M.D.   On: 09/29/2021 13:09   DG CHEST PORT 1 VIEW  Result Date: 09/26/2021 CLINICAL DATA:  55 year old male with respiratory failure. EXAM: PORTABLE CHEST 1 VIEW COMPARISON:  09/22/2021 and earlier. FINDINGS: Portable AP semi upright view at 0547 hours. Stable visible enteric tube. Left chest cardiac AICD and right chest generator device with lead extending toward the head are stable. Large lung volumes. Stable mild cardiomegaly. Other mediastinal contours are within normal limits. Visualized tracheal air column is within normal limits. No pneumothorax or pulmonary edema. Regressed but not resolved abnormal left lung base opacity, left hemidiaphragm visible now. Evidence of a small left pleural effusion. Patchy opacity at the right lung base has  also regressed. Attenuated upper lobe bronchovascular markings suggesting emphysema. No areas of worsening ventilation. No acute osseous abnormality identified. IMPRESSION: 1. Pulmonary hyperinflation with regressed bibasilar opacity. Some residual on the left, and small left pleural effusion suspected. 2. No new cardiopulmonary abnormality. Electronically Signed   By: Odessa Fleming M.D.   On: 09/26/2021 06:42   DG Chest Port 1 View  Result Date: 09/22/2021 CLINICAL DATA:  Hypoxemia EXAM: PORTABLE CHEST 1 VIEW COMPARISON:  06/07/2020 FINDINGS: Hazy density at the retrocardiac lung. Reticulonodular density at the right base. Increased vascular markings from before. Left IJ line with tip at the upper right atrium. Enteric tube reaches the stomach. Generator pack in the right chest and fibrillator lead into the right ventricle IMPRESSION: Infiltrates at the lung bases.  Possible vascular congestion. Electronically Signed   By: Tiburcio Pea M.D.   On: 09/22/2021 10:46   DG Abd Portable 1V  Result Date: 09/29/2021 CLINICAL DATA:  w abdominal pain, systemic inflammatory response. EXAM: PORTABLE ABDOMEN - 1 VIEW COMPARISON:  Comparison made with exam of September 28, 2021. FINDINGS: Gastric tube remains in the distal stomach. Opacity of gas in general throughout the abdomen. Some mildly distended loops of small bowel in the pelvis. Patchy basilar airspace opacities with similar appearance to prior imaging. EKG leads project over the chest. Cardiac pacer defibrillator lead in the chest as before. On limited assessment no acute skeletal process. IMPRESSION: Mild distended loops of small bowel in the pelvis. Otherwise no interval change in the appearance of the abdomen. Developing mild distension could reflect ileus. If there are symptoms of obstruction or acute process could consider CT as warranted for further evaluation given the relative absence of bowel gas that is seen across multiple prior exams. Electronically  Signed   By: Donzetta Kohut M.D.   On: 09/29/2021 13:06   DG Abd Portable 1V  Result Date: 09/28/2021 CLINICAL DATA:  A 55 year old male presents for abdominal pain evaluation. EXAM: PORTABLE ABDOMEN - 1 VIEW COMPARISON:  55 year old male presents with abdominal pain. FINDINGS: Central venous access device incidentally noted with lung bases terminating at the caval to atrial junction. Cardiac pacer defibrillator projects over the heart as before. Power pack for stimulator and pacer overlying LEFT and RIGHT chest. Patchy areas of pulmonary opacity are noted at the lung bases. Gastric tube terminates in the stomach, unchanged position compared to previous imaging. Paucity of gas seen in the abdomen. Signs of rectal gas and scattered likely colonic gas in the descending  and sigmoid colon as well as RIGHT hemiabdomen. No acute skeletal process on limited assessment. IMPRESSION: Paucity of gas seen in the abdomen. Stable appearance of gastric tube. Patchy areas of pulmonary opacity at the lung bases. Could reflect signs of multifocal infection or asymmetric edema. Electronically Signed   By: Donzetta Kohut M.D.   On: 09/28/2021 12:14   DG Abd Portable 1V  Result Date: 09/22/2021 CLINICAL DATA:  NG tube placement EXAM: PORTABLE ABDOMEN - 1 VIEW COMPARISON:  None. FINDINGS: Enteric tube terminates in the distal gastric body. Paucity of bowel gas. Lung bases are essentially clear.  ICD lead. IMPRESSION: Enteric tube terminates in the distal gastric body. Electronically Signed   By: Charline Bills M.D.   On: 09/22/2021 03:29    Labs:  CBC: Recent Labs    09/28/21 1119 09/29/21 0751 09/30/21 0417 10/02/21 0356  WBC 23.8* 15.2* 12.2* 13.8*  HGB 11.6* 10.2* 10.2* 9.1*  HCT 34.9* 30.1* 30.9* 27.0*  PLT 454* 430* 420* 551*    COAGS: Recent Labs    10/09/20 1227  INR 1.1    BMP: Recent Labs    09/29/21 0751 09/30/21 0417 10/02/21 0356 10/03/21 0415  NA 134* 135 139 135  K 3.5 3.7 2.9*  4.1  CL 100 103 108 105  CO2 25 22 23 23   GLUCOSE 104* 88 105* 158*  BUN 21* 24* 27* 22*  CALCIUM 8.4* 8.5* 8.8* 8.8*  CREATININE 0.58* 0.56* 0.67 0.54*  GFRNONAA >60 >60 >60 >60    LIVER FUNCTION TESTS: Recent Labs    01/21/21 1510 03/21/21 1230 09/22/21 0502 09/29/21 0751  BILITOT 1.0 0.6 0.9 0.8  AST 17 12* 46* 19  ALT 25 10 64* 25  ALKPHOS 36* 31* 41 45  PROT 7.2 7.2 5.3* 6.4*  ALBUMIN 4.3 4.4 2.1* 2.1*    TUMOR MARKERS: No results for input(s): AFPTM, CEA, CA199, CHROMGRNA in the last 8760 hours.  Assessment and Plan:  Aspiration PNA Malnutrition Dysphagia Wt loss and deconditioning Scheduled for percutaneous gastric tube placement in IR 11/2 Off eliquis starting Sunday Risks and benefits image guided gastrostomy tube placement was discussed with the patient's cousin Chad Christensen via phone including, but not limited to the need for a barium enema during the procedure, bleeding, infection, peritonitis and/or damage to adjacent structures.  All questions were answered, Chad Christensen is agreeable to proceed. Consent signed and in chart.    Thank you for this interesting consult.  I greatly enjoyed meeting Chad Christensen and look forward to participating in their care.  A copy of this report was sent to the requesting provider on this date.  Electronically Signed: Robet Leu, PA-C 10/04/2021, 10:46 AM   I spent a total of 20 Minutes    in face to face in clinical consultation, greater than 50% of which was counseling/coordinating care for percutaneous gastric tube placement

## 2021-10-04 NOTE — Progress Notes (Signed)
*  PRELIMINARY RESULTS* Echocardiogram 2D Echocardiogram has been performed.  Carolyne Fiscal 10/04/2021, 2:09 PM

## 2021-10-05 LAB — BASIC METABOLIC PANEL
Anion gap: 4 — ABNORMAL LOW (ref 5–15)
BUN: 20 mg/dL (ref 6–20)
CO2: 25 mmol/L (ref 22–32)
Calcium: 8.5 mg/dL — ABNORMAL LOW (ref 8.9–10.3)
Chloride: 108 mmol/L (ref 98–111)
Creatinine, Ser: 0.54 mg/dL — ABNORMAL LOW (ref 0.61–1.24)
GFR, Estimated: >60 mL/min (ref >60)
Glucose, Bld: 93 mg/dL (ref 70–99)
Potassium: 3.4 mmol/L — ABNORMAL LOW (ref 3.5–5.1)
Sodium: 137 mmol/L (ref 135–145)

## 2021-10-05 LAB — CBC
HCT: 31.5 % — ABNORMAL LOW (ref 39.0–52.0)
Hemoglobin: 10.4 g/dL — ABNORMAL LOW (ref 13.0–17.0)
MCH: 30.7 pg (ref 26.0–34.0)
MCHC: 33 g/dL (ref 30.0–36.0)
MCV: 92.9 fL (ref 80.0–100.0)
Platelets: 406 10*3/uL — ABNORMAL HIGH (ref 150–400)
RBC: 3.39 MIL/uL — ABNORMAL LOW (ref 4.22–5.81)
RDW: 13.2 % (ref 11.5–15.5)
WBC: 10.3 10*3/uL (ref 4.0–10.5)
nRBC: 0 % (ref 0.0–0.2)

## 2021-10-05 LAB — MAGNESIUM: Magnesium: 2.1 mg/dL (ref 1.7–2.4)

## 2021-10-06 LAB — COMPREHENSIVE METABOLIC PANEL
ALT: 24 U/L (ref 0–44)
AST: 18 U/L (ref 15–41)
Albumin: 2.1 g/dL — ABNORMAL LOW (ref 3.5–5.0)
Alkaline Phosphatase: 42 U/L (ref 38–126)
Anion gap: 4 — ABNORMAL LOW (ref 5–15)
BUN: 17 mg/dL (ref 6–20)
CO2: 25 mmol/L (ref 22–32)
Calcium: 9 mg/dL (ref 8.9–10.3)
Chloride: 107 mmol/L (ref 98–111)
Creatinine, Ser: 0.43 mg/dL — ABNORMAL LOW (ref 0.61–1.24)
GFR, Estimated: >60 mL/min (ref >60)
Glucose, Bld: 112 mg/dL — ABNORMAL HIGH (ref 70–99)
Potassium: 3.5 mmol/L (ref 3.5–5.1)
Sodium: 136 mmol/L (ref 135–145)
Total Bilirubin: 0.4 mg/dL (ref 0.3–1.2)
Total Protein: 5.5 g/dL — ABNORMAL LOW (ref 6.5–8.1)

## 2021-10-06 LAB — CBC
HCT: 30.7 % — ABNORMAL LOW (ref 39.0–52.0)
Hemoglobin: 10.2 g/dL — ABNORMAL LOW (ref 13.0–17.0)
MCH: 31 pg (ref 26.0–34.0)
MCHC: 33.2 g/dL (ref 30.0–36.0)
MCV: 93.3 fL (ref 80.0–100.0)
Platelets: 432 10*3/uL — ABNORMAL HIGH (ref 150–400)
RBC: 3.29 MIL/uL — ABNORMAL LOW (ref 4.22–5.81)
RDW: 13.3 % (ref 11.5–15.5)
WBC: 16.3 10*3/uL — ABNORMAL HIGH (ref 4.0–10.5)
nRBC: 0 % (ref 0.0–0.2)

## 2021-10-07 LAB — CBC
HCT: 33 % — ABNORMAL LOW (ref 39.0–52.0)
Hemoglobin: 11.2 g/dL — ABNORMAL LOW (ref 13.0–17.0)
MCH: 30.9 pg (ref 26.0–34.0)
MCHC: 33.9 g/dL (ref 30.0–36.0)
MCV: 91.2 fL (ref 80.0–100.0)
Platelets: 489 10*3/uL — ABNORMAL HIGH (ref 150–400)
RBC: 3.62 MIL/uL — ABNORMAL LOW (ref 4.22–5.81)
RDW: 13.2 % (ref 11.5–15.5)
WBC: 20.4 10*3/uL — ABNORMAL HIGH (ref 4.0–10.5)
nRBC: 0 % (ref 0.0–0.2)

## 2021-10-07 LAB — LACTIC ACID, PLASMA
Lactic Acid, Venous: 3.4 mmol/L (ref 0.5–1.9)
Lactic Acid, Venous: 1.4 mmol/L (ref 0.5–1.9)
Lactic Acid, Venous: 1.1 mmol/L (ref 0.5–1.9)

## 2021-10-07 MED ORDER — VANCOMYCIN HCL IN DEXTROSE 1-5 GM/200ML-% IV SOLN: 1000.0000 mg | Freq: Once | INTRAVENOUS | Status: DC

## 2021-10-07 NOTE — Progress Notes (Signed)
Phone call to pt RN to schedule a time to bring pt down for procedure. This pt is not NPO therefor we will postpone procedure until tomorrow. Advised that pt needs to be NPO after MN, RN verbalized understanding. RAD PA and MD aware.

## 2021-10-07 DEATH — deceased

## 2021-10-08 ENCOUNTER — Other Ambulatory Visit (HOSPITAL_COMMUNITY): Payer: Medicare Other

## 2021-10-08 LAB — BLOOD GAS, ARTERIAL
Acid-base deficit: 3.4 mmol/L — ABNORMAL HIGH (ref 0.0–2.0)
Acid-base deficit: 1.1 mmol/L (ref 0.0–2.0)
Bicarbonate: 21.9 mmol/L (ref 20.0–28.0)
Bicarbonate: 21.7 mmol/L (ref 20.0–28.0)
FIO2: 100
FIO2: 100
O2 Saturation: 93.3 %
O2 Saturation: 91 %
Patient temperature: 36.3
Patient temperature: 36
pCO2 arterial: 42.8 mmHg (ref 32.0–48.0)
pCO2 arterial: 26.3 mmHg — ABNORMAL LOW (ref 32.0–48.0)
pH, Arterial: 7.324 — ABNORMAL LOW (ref 7.350–7.450)
pH, Arterial: 7.522 — ABNORMAL HIGH (ref 7.350–7.450)
pO2, Arterial: 77.1 mmHg — ABNORMAL LOW (ref 83.0–108.0)
pO2, Arterial: 53.5 mmHg — ABNORMAL LOW (ref 83.0–108.0)

## 2021-10-08 LAB — CBC
HCT: 30.7 % — ABNORMAL LOW (ref 39.0–52.0)
Hemoglobin: 10.1 g/dL — ABNORMAL LOW (ref 13.0–17.0)
MCH: 30.8 pg (ref 26.0–34.0)
MCHC: 32.9 g/dL (ref 30.0–36.0)
MCV: 93.6 fL (ref 80.0–100.0)
Platelets: 373 K/uL (ref 150–400)
RBC: 3.28 MIL/uL — ABNORMAL LOW (ref 4.22–5.81)
RDW: 13.5 % (ref 11.5–15.5)
WBC: 10.3 K/uL (ref 4.0–10.5)
nRBC: 0 % (ref 0.0–0.2)

## 2021-10-08 LAB — PROTIME-INR
INR: 1.5 — ABNORMAL HIGH (ref 0.8–1.2)
Prothrombin Time: 18 seconds — ABNORMAL HIGH (ref 11.4–15.2)

## 2021-10-08 NOTE — Progress Notes (Signed)
Phone call to pt RN to see if he can travel down for procedure in IR. RN states that pt has increase in O2 demand and will not be able to come for procedure today. I advised that we will follow up tomorrow.  Rad PA and MD aware.

## 2021-10-09 ENCOUNTER — Encounter (HOSPITAL_COMMUNITY): Payer: Self-pay | Admitting: Interventional Radiology

## 2021-10-09 ENCOUNTER — Other Ambulatory Visit (HOSPITAL_COMMUNITY): Payer: Medicare Other

## 2021-10-09 HISTORY — PX: IR GASTROSTOMY TUBE MOD SED: IMG625

## 2021-10-09 LAB — CBC
HCT: 30.2 % — ABNORMAL LOW (ref 39.0–52.0)
Hemoglobin: 9.8 g/dL — ABNORMAL LOW (ref 13.0–17.0)
MCH: 30.6 pg (ref 26.0–34.0)
MCHC: 32.5 g/dL (ref 30.0–36.0)
MCV: 94.4 fL (ref 80.0–100.0)
Platelets: 298 10*3/uL (ref 150–400)
RBC: 3.2 MIL/uL — ABNORMAL LOW (ref 4.22–5.81)
RDW: 13.9 % (ref 11.5–15.5)
WBC: 13 10*3/uL — ABNORMAL HIGH (ref 4.0–10.5)
nRBC: 0 % (ref 0.0–0.2)

## 2021-10-09 LAB — MAGNESIUM: Magnesium: 1.8 mg/dL (ref 1.7–2.4)

## 2021-10-09 LAB — BASIC METABOLIC PANEL
Anion gap: 6 (ref 5–15)
BUN: 9 mg/dL (ref 6–20)
CO2: 24 mmol/L (ref 22–32)
Calcium: 8.3 mg/dL — ABNORMAL LOW (ref 8.9–10.3)
Chloride: 111 mmol/L (ref 98–111)
Creatinine, Ser: 0.4 mg/dL — ABNORMAL LOW (ref 0.61–1.24)
GFR, Estimated: >60 mL/min (ref >60)
Glucose, Bld: 59 mg/dL — ABNORMAL LOW (ref 70–99)
Potassium: 3 mmol/L — ABNORMAL LOW (ref 3.5–5.1)
Sodium: 141 mmol/L (ref 135–145)

## 2021-10-09 MED ORDER — GLUCAGON HCL (RDNA) 1 MG IJ SOLR
INTRAMUSCULAR | Status: AC | PRN
  Administered 2021-10-09: 1 mg via INTRAVENOUS

## 2021-10-09 MED ORDER — FENTANYL CITRATE (PF) 100 MCG/2ML IJ SOLN
INTRAMUSCULAR | Status: AC
  Filled 2021-10-09: qty 2

## 2021-10-09 MED ORDER — GLUCAGON HCL RDNA (DIAGNOSTIC) 1 MG IJ SOLR
INTRAMUSCULAR | Status: AC
  Filled 2021-10-09: qty 1

## 2021-10-09 MED ORDER — MIDAZOLAM HCL 2 MG/2ML IJ SOLN
INTRAMUSCULAR | Status: AC | PRN
  Administered 2021-10-09: .5 mg via INTRAVENOUS

## 2021-10-09 MED ORDER — MIDAZOLAM HCL 2 MG/2ML IJ SOLN
INTRAMUSCULAR | Status: AC
  Filled 2021-10-09: qty 2

## 2021-10-09 MED ORDER — VANCOMYCIN HCL IN DEXTROSE 1-5 GM/200ML-% IV SOLN
INTRAVENOUS | Status: AC | PRN
  Administered 2021-10-09: 1000 mg via INTRAVENOUS

## 2021-10-09 MED ORDER — LIDOCAINE HCL 1 % IJ SOLN
INTRAMUSCULAR | Status: AC
  Administered 2021-10-09: 2 mL
  Filled 2021-10-09: qty 20

## 2021-10-09 MED ORDER — IOHEXOL 300 MG/ML  SOLN
100.0000 mL | Freq: Once | INTRAMUSCULAR | Status: AC | PRN
  Administered 2021-10-09: 15 mL via INTRAVENOUS

## 2021-10-09 MED ORDER — VANCOMYCIN HCL IN DEXTROSE 1-5 GM/200ML-% IV SOLN
INTRAVENOUS | Status: AC
  Filled 2021-10-09: qty 200

## 2021-10-09 NOTE — Procedures (Signed)
Interventional Radiology Procedure Note  Procedure: Placement of percutaneous 20F pull-through gastrostomy tube. Complications: None Recommendations: - NPO except for sips and chips remainder of today and overnight - Maintain G-tube to LWS until tomorrow morning  - May advance diet as tolerated and begin using tube tomorrow morning  Signed,  Gaylon Melchor K. Galen Russman, MD   

## 2021-10-10 ENCOUNTER — Encounter: Payer: Medicare Other | Admitting: Internal Medicine

## 2021-10-10 NOTE — Progress Notes (Signed)
Patient seen for g-tube site s/p placement of 20 Fr pull-through gastrostomy tube on 11.3.22 in IR with Dr. Archer Asa   Insertion site is clean, dry, dressed and appropriately tender to palpation. Tube is to kangaroo pump with no issues.  No bleeding or leakage.    Please call IR for any questions or concerns regarding the g-tube.

## 2021-10-11 LAB — CBC
HCT: 30.4 % — ABNORMAL LOW (ref 39.0–52.0)
Hemoglobin: 10.2 g/dL — ABNORMAL LOW (ref 13.0–17.0)
MCH: 31.2 pg (ref 26.0–34.0)
MCHC: 33.6 g/dL (ref 30.0–36.0)
MCV: 93 fL (ref 80.0–100.0)
Platelets: 284 10*3/uL (ref 150–400)
RBC: 3.27 MIL/uL — ABNORMAL LOW (ref 4.22–5.81)
RDW: 14 % (ref 11.5–15.5)
WBC: 15.6 10*3/uL — ABNORMAL HIGH (ref 4.0–10.5)
nRBC: 0 % (ref 0.0–0.2)

## 2021-10-11 LAB — BASIC METABOLIC PANEL
Anion gap: 6 (ref 5–15)
BUN: 16 mg/dL (ref 6–20)
CO2: 26 mmol/L (ref 22–32)
Calcium: 8.4 mg/dL — ABNORMAL LOW (ref 8.9–10.3)
Chloride: 106 mmol/L (ref 98–111)
Creatinine, Ser: 0.4 mg/dL — ABNORMAL LOW (ref 0.61–1.24)
GFR, Estimated: >60 mL/min (ref >60)
Glucose, Bld: 112 mg/dL — ABNORMAL HIGH (ref 70–99)
Potassium: 3.1 mmol/L — ABNORMAL LOW (ref 3.5–5.1)
Sodium: 138 mmol/L (ref 135–145)

## 2021-10-11 LAB — MAGNESIUM: Magnesium: 1.8 mg/dL (ref 1.7–2.4)

## 2021-10-12 LAB — RESP PANEL BY RT-PCR (FLU A&B, COVID) ARPGX2
Influenza A by PCR: NEGATIVE
Influenza B by PCR: NEGATIVE
SARS Coronavirus 2 by RT PCR: NEGATIVE

## 2021-10-12 LAB — POTASSIUM: Potassium: 4.3 mmol/L (ref 3.5–5.1)

## 2021-10-13 ENCOUNTER — Other Ambulatory Visit (HOSPITAL_COMMUNITY): Payer: Medicare Other

## 2021-10-13 LAB — CBC
HCT: 32.4 % — ABNORMAL LOW (ref 39.0–52.0)
Hemoglobin: 10.9 g/dL — ABNORMAL LOW (ref 13.0–17.0)
MCH: 31.1 pg (ref 26.0–34.0)
MCHC: 33.6 g/dL (ref 30.0–36.0)
MCV: 92.6 fL (ref 80.0–100.0)
Platelets: 235 10*3/uL (ref 150–400)
RBC: 3.5 MIL/uL — ABNORMAL LOW (ref 4.22–5.81)
RDW: 14.1 % (ref 11.5–15.5)
WBC: 23.4 10*3/uL — ABNORMAL HIGH (ref 4.0–10.5)
nRBC: 0 % (ref 0.0–0.2)

## 2021-10-13 LAB — COMPREHENSIVE METABOLIC PANEL
ALT: 21 U/L (ref 0–44)
AST: 18 U/L (ref 15–41)
Albumin: 2 g/dL — ABNORMAL LOW (ref 3.5–5.0)
Alkaline Phosphatase: 38 U/L (ref 38–126)
Anion gap: 7 (ref 5–15)
BUN: 12 mg/dL (ref 6–20)
CO2: 31 mmol/L (ref 22–32)
Calcium: 8.6 mg/dL — ABNORMAL LOW (ref 8.9–10.3)
Chloride: 100 mmol/L (ref 98–111)
Creatinine, Ser: 0.39 mg/dL — ABNORMAL LOW (ref 0.61–1.24)
GFR, Estimated: >60 mL/min (ref >60)
Glucose, Bld: 97 mg/dL (ref 70–99)
Potassium: 3.5 mmol/L (ref 3.5–5.1)
Sodium: 138 mmol/L (ref 135–145)
Total Bilirubin: 0.5 mg/dL (ref 0.3–1.2)
Total Protein: 5.3 g/dL — ABNORMAL LOW (ref 6.5–8.1)

## 2021-10-13 LAB — EPSTEIN-BARR VIRUS (EBV) ANTIBODY PROFILE
EBV NA IgG: 209 U/mL — ABNORMAL HIGH (ref 0.0–17.9)
EBV VCA IgG: >600 U/mL — ABNORMAL HIGH (ref 0.0–17.9)
EBV VCA IgM: <36 U/mL (ref 0.0–35.9)

## 2021-10-14 NOTE — Progress Notes (Signed)
No ICM remote transmission received for 10/14/2021 and next ICM transmission scheduled for 11/03/2021.    

## 2021-10-15 LAB — CBC
HCT: 33.2 % — ABNORMAL LOW (ref 39.0–52.0)
Hemoglobin: 10.9 g/dL — ABNORMAL LOW (ref 13.0–17.0)
MCH: 30.9 pg (ref 26.0–34.0)
MCHC: 32.8 g/dL (ref 30.0–36.0)
MCV: 94.1 fL (ref 80.0–100.0)
Platelets: 221 10*3/uL (ref 150–400)
RBC: 3.53 MIL/uL — ABNORMAL LOW (ref 4.22–5.81)
RDW: 14.3 % (ref 11.5–15.5)
WBC: 19.4 10*3/uL — ABNORMAL HIGH (ref 4.0–10.5)
nRBC: 0 % (ref 0.0–0.2)

## 2021-10-15 LAB — BASIC METABOLIC PANEL
Anion gap: 7 (ref 5–15)
BUN: 15 mg/dL (ref 6–20)
CO2: 32 mmol/L (ref 22–32)
Calcium: 8.8 mg/dL — ABNORMAL LOW (ref 8.9–10.3)
Chloride: 98 mmol/L (ref 98–111)
Creatinine, Ser: 0.44 mg/dL — ABNORMAL LOW (ref 0.61–1.24)
GFR, Estimated: >60 mL/min (ref >60)
Glucose, Bld: 107 mg/dL — ABNORMAL HIGH (ref 70–99)
Potassium: 4.1 mmol/L (ref 3.5–5.1)
Sodium: 137 mmol/L (ref 135–145)

## 2021-10-16 LAB — CULTURE, GROUP A STREP (THRC)

## 2021-10-17 ENCOUNTER — Other Ambulatory Visit (HOSPITAL_COMMUNITY): Payer: Medicare Other

## 2021-10-17 LAB — CBC
HCT: 34.1 % — ABNORMAL LOW (ref 39.0–52.0)
Hemoglobin: 11.3 g/dL — ABNORMAL LOW (ref 13.0–17.0)
MCH: 31 pg (ref 26.0–34.0)
MCHC: 33.1 g/dL (ref 30.0–36.0)
MCV: 93.4 fL (ref 80.0–100.0)
Platelets: 216 10*3/uL (ref 150–400)
RBC: 3.65 MIL/uL — ABNORMAL LOW (ref 4.22–5.81)
RDW: 14 % (ref 11.5–15.5)
WBC: 22.9 10*3/uL — ABNORMAL HIGH (ref 4.0–10.5)
nRBC: 0 % (ref 0.0–0.2)

## 2021-10-17 LAB — BASIC METABOLIC PANEL
Anion gap: 7 (ref 5–15)
BUN: 16 mg/dL (ref 6–20)
CO2: 29 mmol/L (ref 22–32)
Calcium: 8.4 mg/dL — ABNORMAL LOW (ref 8.9–10.3)
Chloride: 101 mmol/L (ref 98–111)
Creatinine, Ser: 0.37 mg/dL — ABNORMAL LOW (ref 0.61–1.24)
GFR, Estimated: >60 mL/min (ref >60)
Glucose, Bld: 127 mg/dL — ABNORMAL HIGH (ref 70–99)
Potassium: 3.6 mmol/L (ref 3.5–5.1)
Sodium: 137 mmol/L (ref 135–145)

## 2021-10-17 LAB — VANCOMYCIN, TROUGH: Vancomycin Tr: 10 ug/mL — ABNORMAL LOW (ref 15–20)

## 2021-10-17 LAB — CULTURE, RESPIRATORY W GRAM STAIN

## 2021-10-19 LAB — VANCOMYCIN, TROUGH
Vancomycin Tr: 10 ug/mL — ABNORMAL LOW (ref 15–20)
Vancomycin Tr: 17 ug/mL (ref 15–20)

## 2021-10-19 LAB — BASIC METABOLIC PANEL
Anion gap: 6 (ref 5–15)
BUN: 15 mg/dL (ref 6–20)
CO2: 27 mmol/L (ref 22–32)
Calcium: 8.5 mg/dL — ABNORMAL LOW (ref 8.9–10.3)
Chloride: 106 mmol/L (ref 98–111)
Creatinine, Ser: <0.3 mg/dL — ABNORMAL LOW (ref 0.61–1.24)
Glucose, Bld: 135 mg/dL — ABNORMAL HIGH (ref 70–99)
Potassium: 3.6 mmol/L (ref 3.5–5.1)
Sodium: 139 mmol/L (ref 135–145)

## 2021-10-19 LAB — CBC
HCT: 32.5 % — ABNORMAL LOW (ref 39.0–52.0)
Hemoglobin: 10.6 g/dL — ABNORMAL LOW (ref 13.0–17.0)
MCH: 30.4 pg (ref 26.0–34.0)
MCHC: 32.6 g/dL (ref 30.0–36.0)
MCV: 93.1 fL (ref 80.0–100.0)
Platelets: 206 10*3/uL (ref 150–400)
RBC: 3.49 MIL/uL — ABNORMAL LOW (ref 4.22–5.81)
RDW: 14.1 % (ref 11.5–15.5)
WBC: 21.8 10*3/uL — ABNORMAL HIGH (ref 4.0–10.5)
nRBC: 0 % (ref 0.0–0.2)

## 2021-10-19 LAB — MAGNESIUM: Magnesium: 1.9 mg/dL (ref 1.7–2.4)

## 2021-10-20 LAB — VANCOMYCIN, TROUGH
Vancomycin Tr: 34 ug/mL (ref 15–20)
Vancomycin Tr: 51 ug/mL (ref 15–20)

## 2021-10-21 LAB — CBC
HCT: 29.4 % — ABNORMAL LOW (ref 39.0–52.0)
Hemoglobin: 9.8 g/dL — ABNORMAL LOW (ref 13.0–17.0)
MCH: 30.2 pg (ref 26.0–34.0)
MCHC: 33.3 g/dL (ref 30.0–36.0)
MCV: 90.5 fL (ref 80.0–100.0)
Platelets: 214 10*3/uL (ref 150–400)
RBC: 3.25 MIL/uL — ABNORMAL LOW (ref 4.22–5.81)
RDW: 14.2 % (ref 11.5–15.5)
WBC: 14.6 10*3/uL — ABNORMAL HIGH (ref 4.0–10.5)
nRBC: 0 % (ref 0.0–0.2)

## 2021-10-21 LAB — COMPREHENSIVE METABOLIC PANEL
ALT: 20 U/L (ref 0–44)
AST: 14 U/L — ABNORMAL LOW (ref 15–41)
Albumin: 1.9 g/dL — ABNORMAL LOW (ref 3.5–5.0)
Alkaline Phosphatase: 40 U/L (ref 38–126)
Anion gap: 7 (ref 5–15)
BUN: 16 mg/dL (ref 6–20)
CO2: 28 mmol/L (ref 22–32)
Calcium: 8.1 mg/dL — ABNORMAL LOW (ref 8.9–10.3)
Chloride: 102 mmol/L (ref 98–111)
Creatinine, Ser: <0.3 mg/dL — ABNORMAL LOW (ref 0.61–1.24)
Glucose, Bld: 107 mg/dL — ABNORMAL HIGH (ref 70–99)
Potassium: 3.4 mmol/L — ABNORMAL LOW (ref 3.5–5.1)
Sodium: 137 mmol/L (ref 135–145)
Total Bilirubin: 0.4 mg/dL (ref 0.3–1.2)
Total Protein: 5 g/dL — ABNORMAL LOW (ref 6.5–8.1)

## 2021-10-22 ENCOUNTER — Other Ambulatory Visit (HOSPITAL_COMMUNITY): Payer: Medicare Other

## 2021-10-22 LAB — COMPREHENSIVE METABOLIC PANEL
ALT: 20 U/L (ref 0–44)
AST: 14 U/L — ABNORMAL LOW (ref 15–41)
Albumin: 1.8 g/dL — ABNORMAL LOW (ref 3.5–5.0)
Alkaline Phosphatase: 39 U/L (ref 38–126)
Anion gap: 6 (ref 5–15)
BUN: 14 mg/dL (ref 6–20)
CO2: 25 mmol/L (ref 22–32)
Calcium: 8.1 mg/dL — ABNORMAL LOW (ref 8.9–10.3)
Chloride: 106 mmol/L (ref 98–111)
Creatinine, Ser: 0.31 mg/dL — ABNORMAL LOW (ref 0.61–1.24)
GFR, Estimated: >60 mL/min (ref >60)
Glucose, Bld: 129 mg/dL — ABNORMAL HIGH (ref 70–99)
Potassium: 3.7 mmol/L (ref 3.5–5.1)
Sodium: 137 mmol/L (ref 135–145)
Total Bilirubin: 0.5 mg/dL (ref 0.3–1.2)
Total Protein: 5 g/dL — ABNORMAL LOW (ref 6.5–8.1)

## 2021-10-22 LAB — GAMMA GT: GGT: 15 U/L (ref 7–50)

## 2021-10-22 LAB — MAGNESIUM: Magnesium: 1.6 mg/dL — ABNORMAL LOW (ref 1.7–2.4)

## 2021-10-22 LAB — PROTIME-INR
INR: 1.5 — ABNORMAL HIGH (ref 0.8–1.2)
Prothrombin Time: 18.4 seconds — ABNORMAL HIGH (ref 11.4–15.2)

## 2021-10-22 LAB — URIC ACID: Uric Acid, Serum: 2.8 mg/dL — ABNORMAL LOW (ref 3.7–8.6)

## 2021-10-22 LAB — PHOSPHORUS
Phosphorus: 2.8 mg/dL (ref 2.5–4.6)
Phosphorus: 3 mg/dL (ref 2.5–4.6)

## 2021-10-22 LAB — AMMONIA: Ammonia: 23 umol/L (ref 9–35)

## 2021-10-22 LAB — TRIGLYCERIDES: Triglycerides: 23 mg/dL (ref <150)

## 2021-10-22 LAB — LACTIC ACID, PLASMA: Lactic Acid, Venous: 0.9 mmol/L (ref 0.5–1.9)

## 2021-10-22 LAB — CK: Total CK: 66 U/L (ref 49–397)

## 2021-10-23 LAB — CBC
HCT: 31.2 % — ABNORMAL LOW (ref 39.0–52.0)
Hemoglobin: 10.1 g/dL — ABNORMAL LOW (ref 13.0–17.0)
MCH: 30.3 pg (ref 26.0–34.0)
MCHC: 32.4 g/dL (ref 30.0–36.0)
MCV: 93.7 fL (ref 80.0–100.0)
Platelets: 230 10*3/uL (ref 150–400)
RBC: 3.33 MIL/uL — ABNORMAL LOW (ref 4.22–5.81)
RDW: 14.4 % (ref 11.5–15.5)
WBC: 10.3 10*3/uL (ref 4.0–10.5)
nRBC: 0 % (ref 0.0–0.2)

## 2021-10-23 LAB — TSH: TSH: 1.672 u[IU]/mL (ref 0.350–4.500)

## 2021-10-24 LAB — MAGNESIUM: Magnesium: 2.1 mg/dL (ref 1.7–2.4)

## 2021-10-25 LAB — CBC
HCT: 28.2 % — ABNORMAL LOW (ref 39.0–52.0)
Hemoglobin: 9.6 g/dL — ABNORMAL LOW (ref 13.0–17.0)
MCH: 30.3 pg (ref 26.0–34.0)
MCHC: 34 g/dL (ref 30.0–36.0)
MCV: 89 fL (ref 80.0–100.0)
Platelets: 267 10*3/uL (ref 150–400)
RBC: 3.17 MIL/uL — ABNORMAL LOW (ref 4.22–5.81)
RDW: 14 % (ref 11.5–15.5)
WBC: 13.8 10*3/uL — ABNORMAL HIGH (ref 4.0–10.5)
nRBC: 0 % (ref 0.0–0.2)

## 2021-10-25 LAB — MAGNESIUM: Magnesium: 1.9 mg/dL (ref 1.7–2.4)

## 2021-10-25 LAB — PHOSPHORUS: Phosphorus: 3.2 mg/dL (ref 2.5–4.6)

## 2021-10-28 ENCOUNTER — Other Ambulatory Visit (HOSPITAL_COMMUNITY): Payer: Medicare Other

## 2021-10-28 LAB — BLOOD GAS, ARTERIAL
Acid-Base Excess: 6.6 mmol/L — ABNORMAL HIGH (ref 0.0–2.0)
Acid-Base Excess: 6.3 mmol/L — ABNORMAL HIGH (ref 0.0–2.0)
Bicarbonate: 33.3 mmol/L — ABNORMAL HIGH (ref 20.0–28.0)
Bicarbonate: 31.3 mmol/L — ABNORMAL HIGH (ref 20.0–28.0)
FIO2: 44
FIO2: 50
O2 Saturation: 97.2 %
O2 Saturation: 92.8 %
Patient temperature: 37.4
Patient temperature: 37.4
pCO2 arterial: 76.9 mmHg (ref 32.0–48.0)
pCO2 arterial: 54.6 mmHg — ABNORMAL HIGH (ref 32.0–48.0)
pH, Arterial: 7.262 — ABNORMAL LOW (ref 7.350–7.450)
pH, Arterial: 7.379 (ref 7.350–7.450)
pO2, Arterial: 98.6 mmHg (ref 83.0–108.0)
pO2, Arterial: 64.9 mmHg — ABNORMAL LOW (ref 83.0–108.0)

## 2021-10-28 LAB — LACTIC ACID, PLASMA: Lactic Acid, Venous: 1.1 mmol/L (ref 0.5–1.9)

## 2021-10-28 LAB — CBC
HCT: 32.2 % — ABNORMAL LOW (ref 39.0–52.0)
Hemoglobin: 10.1 g/dL — ABNORMAL LOW (ref 13.0–17.0)
MCH: 30 pg (ref 26.0–34.0)
MCHC: 31.4 g/dL (ref 30.0–36.0)
MCV: 95.5 fL (ref 80.0–100.0)
Platelets: 391 10*3/uL (ref 150–400)
RBC: 3.37 MIL/uL — ABNORMAL LOW (ref 4.22–5.81)
RDW: 14.5 % (ref 11.5–15.5)
WBC: 34.2 10*3/uL — ABNORMAL HIGH (ref 4.0–10.5)
nRBC: 0 % (ref 0.0–0.2)

## 2021-10-28 LAB — BASIC METABOLIC PANEL
Anion gap: 6 (ref 5–15)
BUN: 51 mg/dL — ABNORMAL HIGH (ref 6–20)
CO2: 31 mmol/L (ref 22–32)
Calcium: 9 mg/dL (ref 8.9–10.3)
Chloride: 93 mmol/L — ABNORMAL LOW (ref 98–111)
Creatinine, Ser: 0.39 mg/dL — ABNORMAL LOW (ref 0.61–1.24)
GFR, Estimated: >60 mL/min (ref >60)
Glucose, Bld: 162 mg/dL — ABNORMAL HIGH (ref 70–99)
Potassium: 6.1 mmol/L — ABNORMAL HIGH (ref 3.5–5.1)
Sodium: 130 mmol/L — ABNORMAL LOW (ref 135–145)

## 2021-10-28 LAB — MAGNESIUM: Magnesium: 2.2 mg/dL (ref 1.7–2.4)

## 2021-10-28 LAB — PHOSPHORUS: Phosphorus: 4.7 mg/dL — ABNORMAL HIGH (ref 2.5–4.6)

## 2021-10-29 LAB — BASIC METABOLIC PANEL
Anion gap: 7 (ref 5–15)
BUN: 91 mg/dL — ABNORMAL HIGH (ref 6–20)
CO2: 30 mmol/L (ref 22–32)
Calcium: 9.4 mg/dL (ref 8.9–10.3)
Chloride: 95 mmol/L — ABNORMAL LOW (ref 98–111)
Creatinine, Ser: 1.1 mg/dL (ref 0.61–1.24)
GFR, Estimated: >60 mL/min (ref >60)
Glucose, Bld: 127 mg/dL — ABNORMAL HIGH (ref 70–99)
Potassium: 6.3 mmol/L (ref 3.5–5.1)
Sodium: 132 mmol/L — ABNORMAL LOW (ref 135–145)

## 2021-10-29 LAB — CBC
HCT: 32.8 % — ABNORMAL LOW (ref 39.0–52.0)
Hemoglobin: 9.8 g/dL — ABNORMAL LOW (ref 13.0–17.0)
MCH: 30.2 pg (ref 26.0–34.0)
MCHC: 29.9 g/dL — ABNORMAL LOW (ref 30.0–36.0)
MCV: 100.9 fL — ABNORMAL HIGH (ref 80.0–100.0)
Platelets: 457 10*3/uL — ABNORMAL HIGH (ref 150–400)
RBC: 3.25 MIL/uL — ABNORMAL LOW (ref 4.22–5.81)
RDW: 14.3 % (ref 11.5–15.5)
WBC: 37.1 10*3/uL — ABNORMAL HIGH (ref 4.0–10.5)
nRBC: 0 % (ref 0.0–0.2)

## 2021-10-29 LAB — TRIGLYCERIDES: Triglycerides: 78 mg/dL (ref <150)

## 2021-10-30 ENCOUNTER — Other Ambulatory Visit (HOSPITAL_COMMUNITY): Payer: Medicare Other

## 2021-10-30 LAB — BASIC METABOLIC PANEL WITH GFR
Anion gap: 7 (ref 5–15)
BUN: 89 mg/dL — ABNORMAL HIGH (ref 6–20)
CO2: 31 mmol/L (ref 22–32)
Calcium: 9.3 mg/dL (ref 8.9–10.3)
Chloride: 101 mmol/L (ref 98–111)
Creatinine, Ser: 0.68 mg/dL (ref 0.61–1.24)
GFR, Estimated: >60 mL/min
Glucose, Bld: 203 mg/dL — ABNORMAL HIGH (ref 70–99)
Potassium: 4.6 mmol/L (ref 3.5–5.1)
Sodium: 139 mmol/L (ref 135–145)

## 2021-10-30 LAB — CBC
HCT: 26.8 % — ABNORMAL LOW (ref 39.0–52.0)
Hemoglobin: 8.3 g/dL — ABNORMAL LOW (ref 13.0–17.0)
MCH: 29.9 pg (ref 26.0–34.0)
MCHC: 31 g/dL (ref 30.0–36.0)
MCV: 96.4 fL (ref 80.0–100.0)
Platelets: 267 10*3/uL (ref 150–400)
RBC: 2.78 MIL/uL — ABNORMAL LOW (ref 4.22–5.81)
RDW: 14.6 % (ref 11.5–15.5)
WBC: 16.8 10*3/uL — ABNORMAL HIGH (ref 4.0–10.5)
nRBC: 0 % (ref 0.0–0.2)

## 2021-10-30 LAB — TROPONIN I (HIGH SENSITIVITY)
Troponin I (High Sensitivity): 34 ng/L — ABNORMAL HIGH (ref <18)
Troponin I (High Sensitivity): 34 ng/L — ABNORMAL HIGH

## 2021-10-30 LAB — VANCOMYCIN, TROUGH: Vancomycin Tr: 21 ug/mL (ref 15–20)

## 2021-10-31 LAB — CBC
HCT: 27.5 % — ABNORMAL LOW (ref 39.0–52.0)
Hemoglobin: 8.6 g/dL — ABNORMAL LOW (ref 13.0–17.0)
MCH: 30.3 pg (ref 26.0–34.0)
MCHC: 31.3 g/dL (ref 30.0–36.0)
MCV: 96.8 fL (ref 80.0–100.0)
Platelets: 298 10*3/uL (ref 150–400)
RBC: 2.84 MIL/uL — ABNORMAL LOW (ref 4.22–5.81)
RDW: 14.8 % (ref 11.5–15.5)
WBC: 15.6 10*3/uL — ABNORMAL HIGH (ref 4.0–10.5)
nRBC: 0.1 % (ref 0.0–0.2)

## 2021-10-31 LAB — BASIC METABOLIC PANEL
Anion gap: 4 — ABNORMAL LOW (ref 5–15)
BUN: 92 mg/dL — ABNORMAL HIGH (ref 6–20)
CO2: 32 mmol/L (ref 22–32)
Calcium: 9.5 mg/dL (ref 8.9–10.3)
Chloride: 104 mmol/L (ref 98–111)
Creatinine, Ser: 0.62 mg/dL (ref 0.61–1.24)
GFR, Estimated: >60 mL/min (ref >60)
Glucose, Bld: 165 mg/dL — ABNORMAL HIGH (ref 70–99)
Potassium: 5.4 mmol/L — ABNORMAL HIGH (ref 3.5–5.1)
Sodium: 140 mmol/L (ref 135–145)

## 2021-10-31 LAB — MAGNESIUM
Magnesium: 2.5 mg/dL — ABNORMAL HIGH (ref 1.7–2.4)
Magnesium: 2.9 mg/dL — ABNORMAL HIGH (ref 1.7–2.4)

## 2021-10-31 LAB — TROPONIN I (HIGH SENSITIVITY): Troponin I (High Sensitivity): 31 ng/L — ABNORMAL HIGH (ref <18)

## 2021-10-31 LAB — PHOSPHORUS: Phosphorus: 5.3 mg/dL — ABNORMAL HIGH (ref 2.5–4.6)

## 2021-11-03 LAB — CULTURE, BLOOD (ROUTINE X 2)
Culture: NO GROWTH
Culture: NO GROWTH

## 2021-11-06 DEATH — deceased

## 2021-11-14 ENCOUNTER — Encounter (HOSPITAL_COMMUNITY): Payer: Medicare Other | Admitting: Cardiology

## 2021-12-01 IMAGING — DX DG CHEST 1V PORT
1 series · 1 of 1 positions shown · non-contrast
Comparison: Previous chest x-rays from [DATE]st and September 22, 2021.

CLINICAL DATA: Abdominal pain, system Strnad inflammatory response.

EXAM:
PORTABLE CHEST 1 VIEW

[chest]
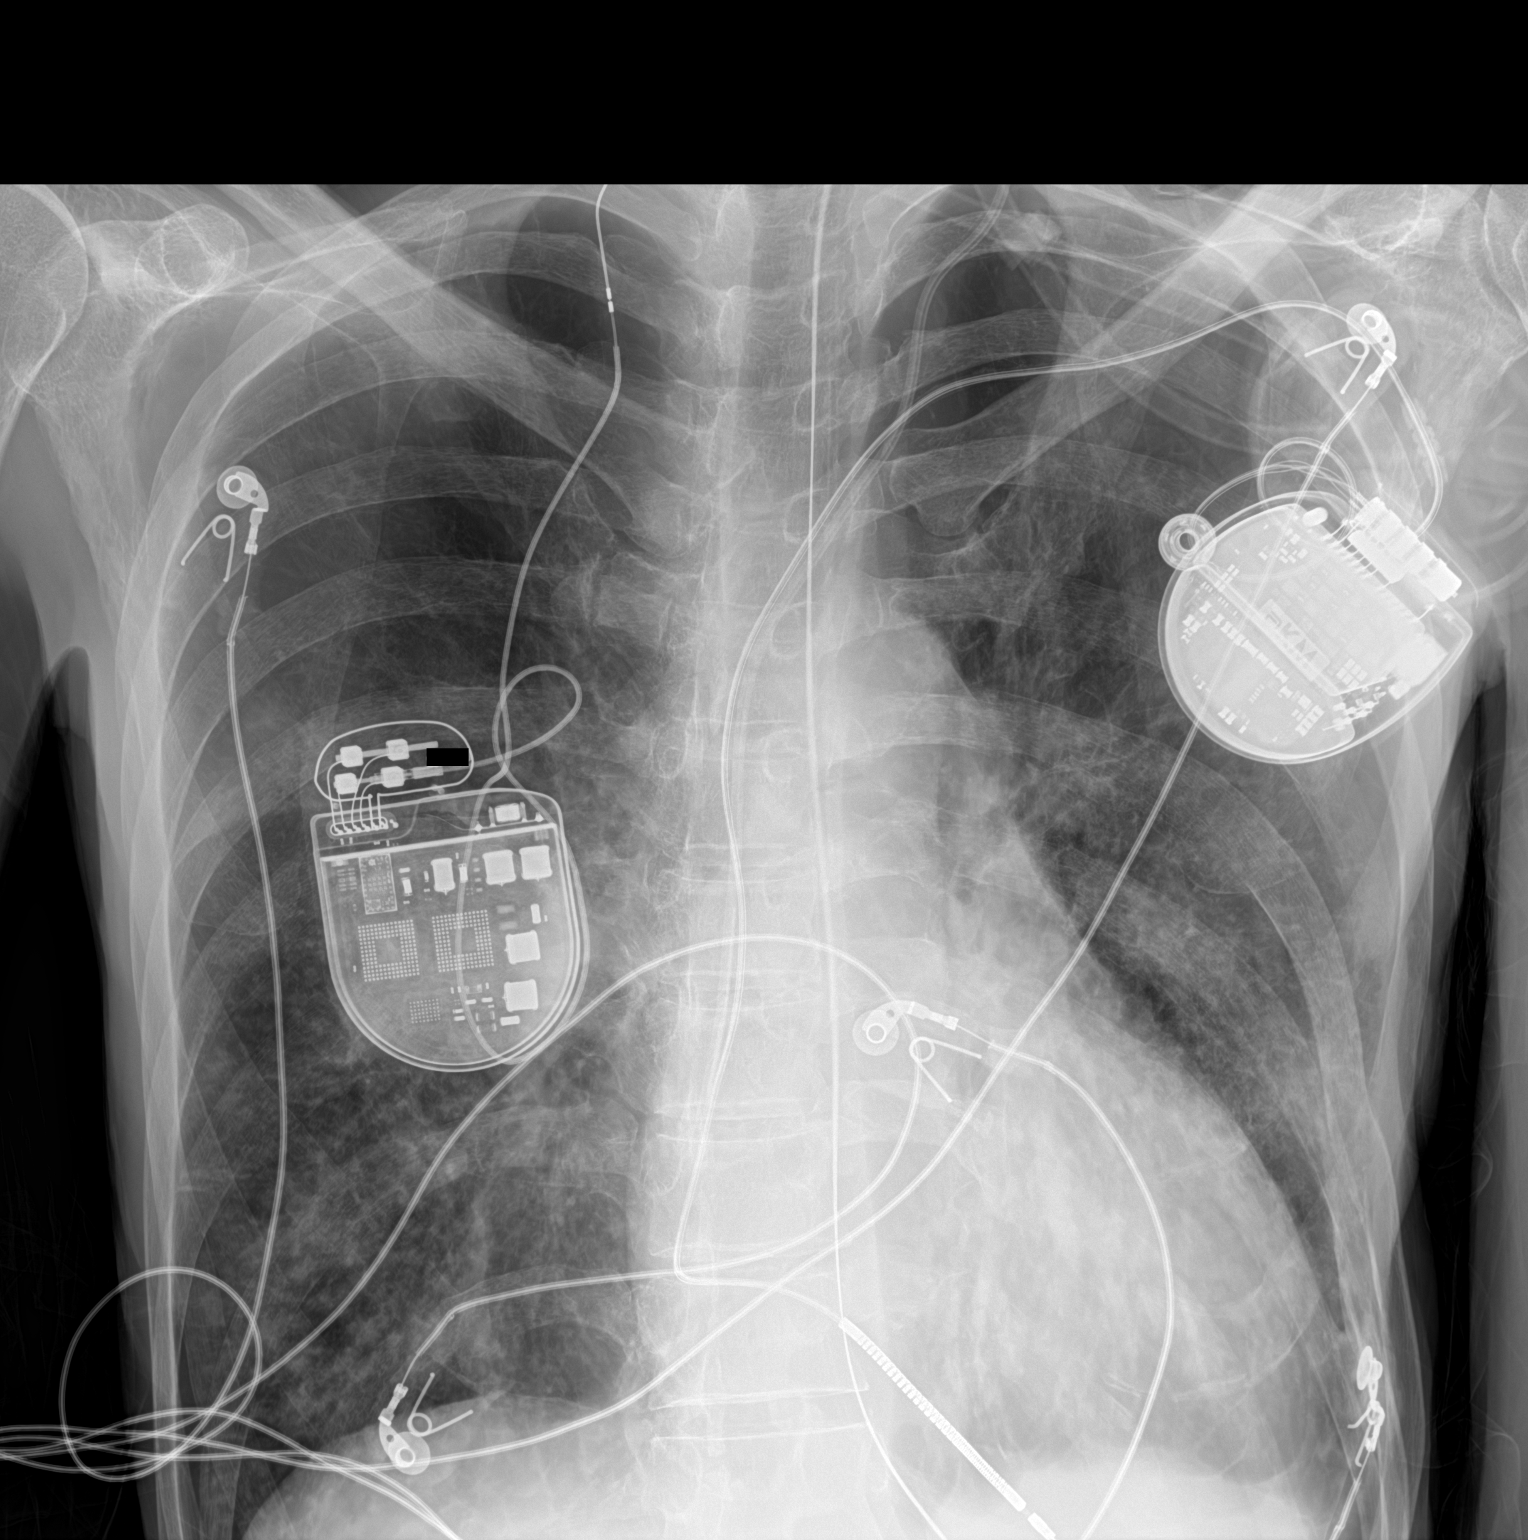

[1 of 1 positions shown; findings below may reference images not displayed]

FINDINGS: Gastric tube courses through in off the radiograph, tip in the
distal stomach based on abdominal radiograph. LEFT IJ central venous
catheter terminates at caval to atrial junction.

LEFT-sided single lead AICD with lead projecting over cardiac
silhouette as before. RIGHT sided nerve stimulator, power pack over
the RIGHT mid chest.

Patchy opacities throughout the chest are slightly worse perhaps
than on September 26, 2021 but general improved since September 22, 2021. Interstitial prominence also with slight increase.
Cardiomediastinal contours and hilar structures are stable. No lobar
level consolidative process or sign of pleural effusion.

On limited assessment there is no acute skeletal process.
IMPRESSION: Mild interstitial prominence and patchy opacities throughout the
chest are slightly worse than on September 27, 2019, but improved from
September 22, 2021. Findings may represent w findings of atypical
infection or asymmetric pulmonary edema.

## 2021-12-04 IMAGING — CT CT ABDOMEN W/O CM
2 of 4 series · 16 of 46 positions shown, 18 images · non-contrast
Comparison: [facility], 09/29/2021.  Chest radiograph, 09/29/2021.

CLINICAL DATA: Dysphagia.  G-tube placement evaluation

EXAM:
CT ABDOMEN WITHOUT CONTRAST
TECHNIQUE: Multidetector CT imaging of the abdomen was performed following the
standard protocol without IV contrast.

[Series 3: ap without · axial · non-contrast · 0.56mm/px · z∈[-207,+8]mm · 13 of 49 slices shown, 15 images]
[im 3/49  soft-tissue]
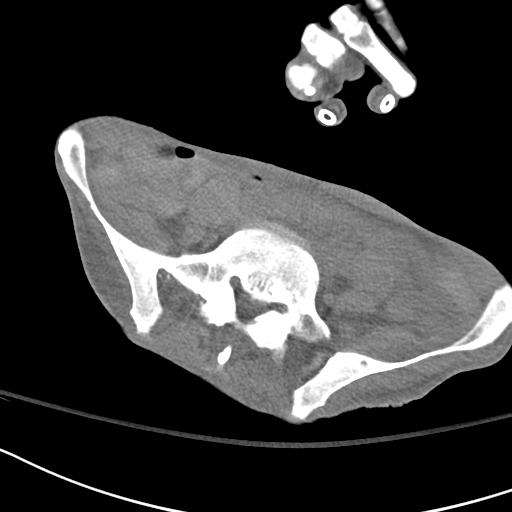
[im 3/49  bone]
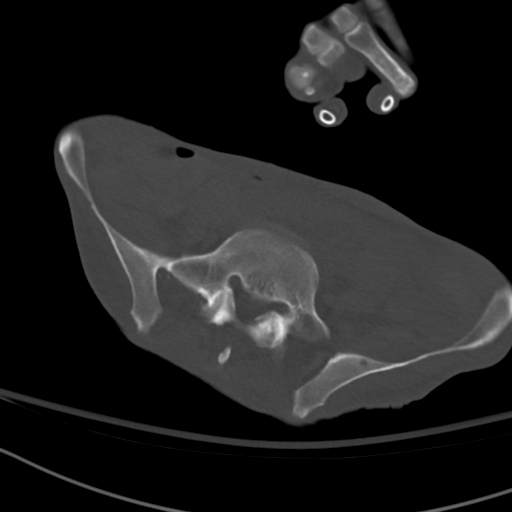
[im 6/49  soft-tissue]
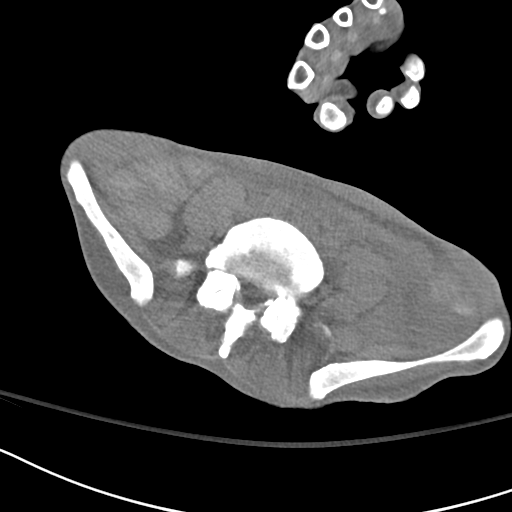
[im 11/49  soft-tissue]
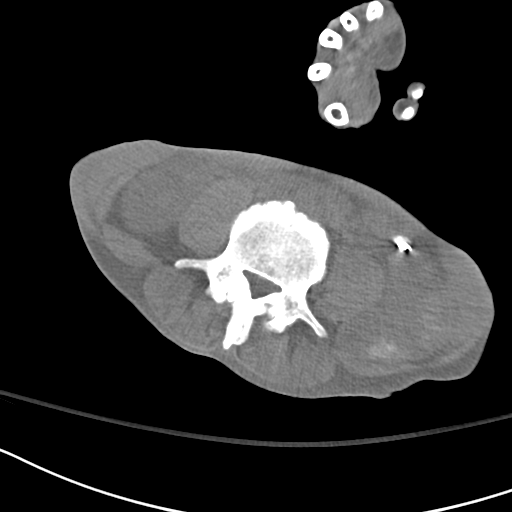
[im 14/49  soft-tissue]
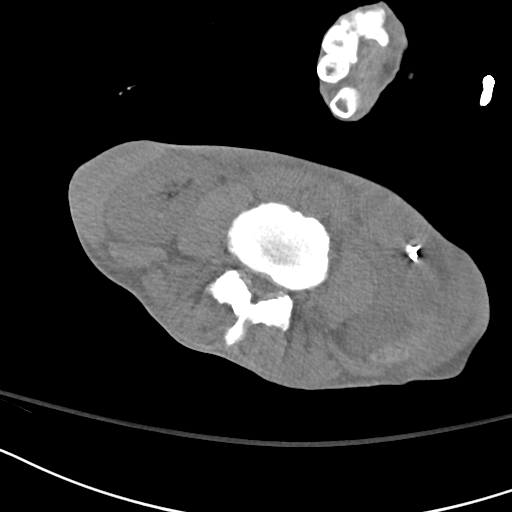
[im 17/49  soft-tissue]
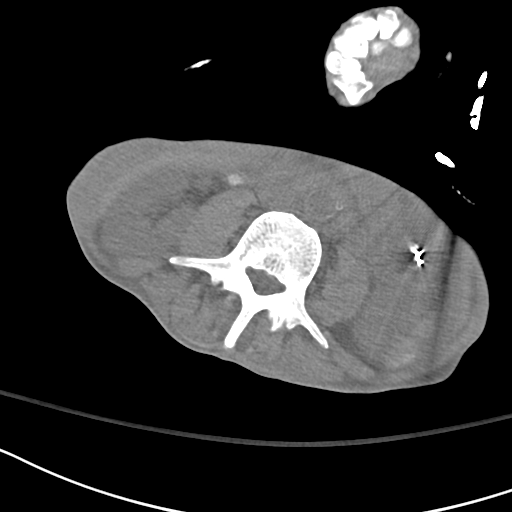
[im 22/49  soft-tissue]
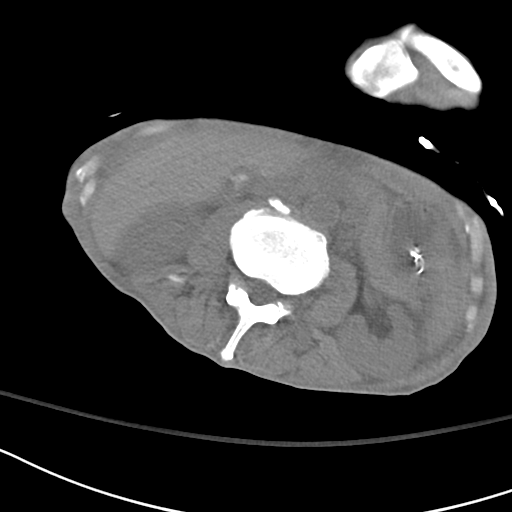
[im 25/49  soft-tissue]
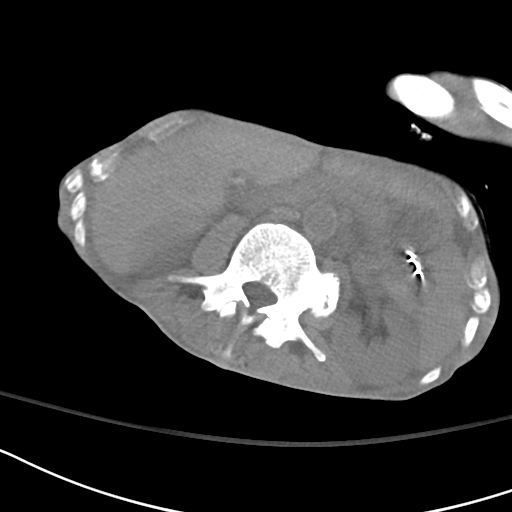
[im 27/49  soft-tissue]
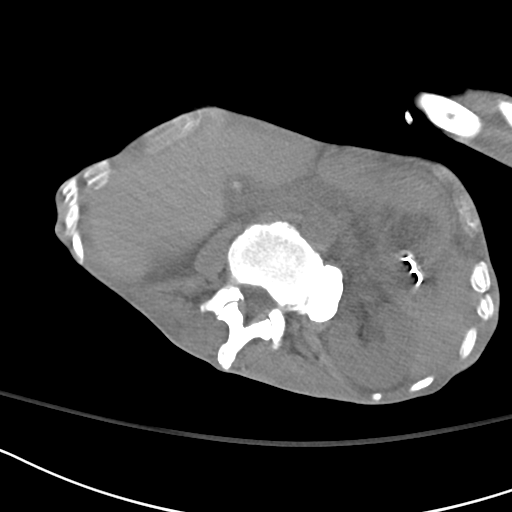
[im 33/49  soft-tissue]
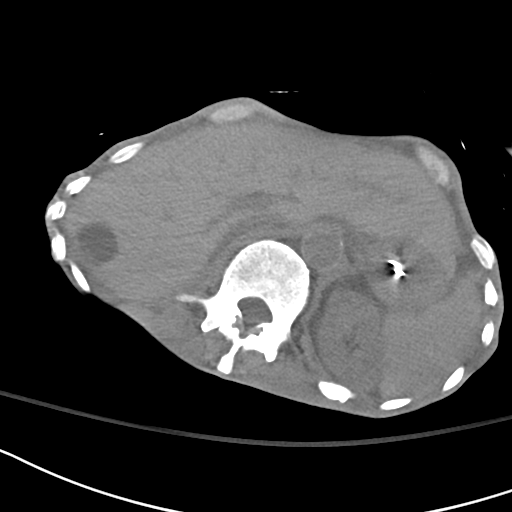
[im 33/49  bone]
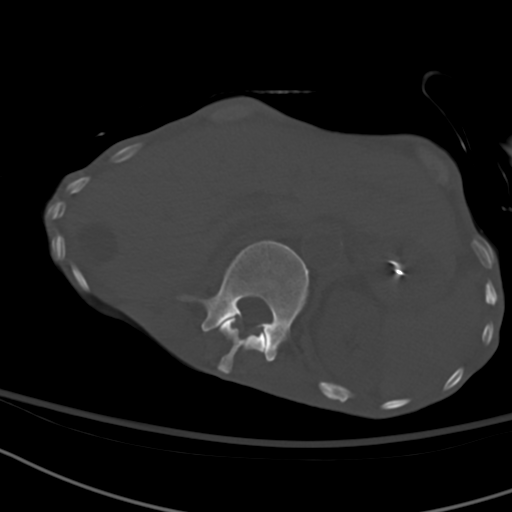
[im 35/49  soft-tissue]
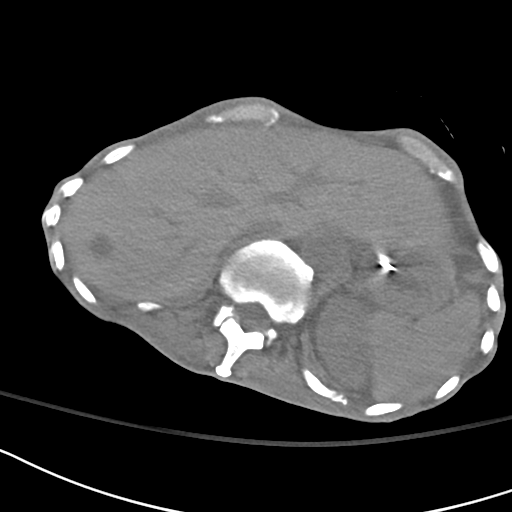
[im 38/49  soft-tissue]
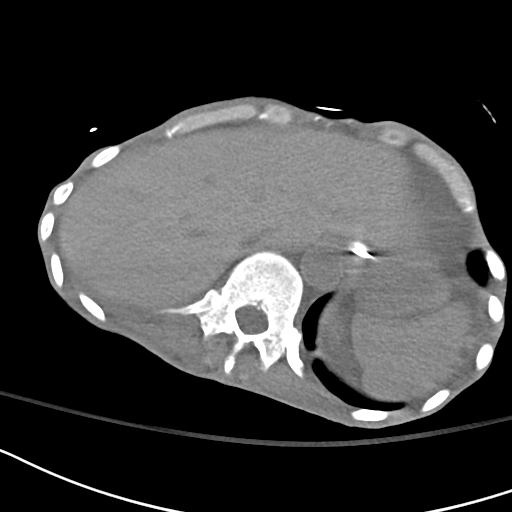
[im 43/49  soft-tissue]
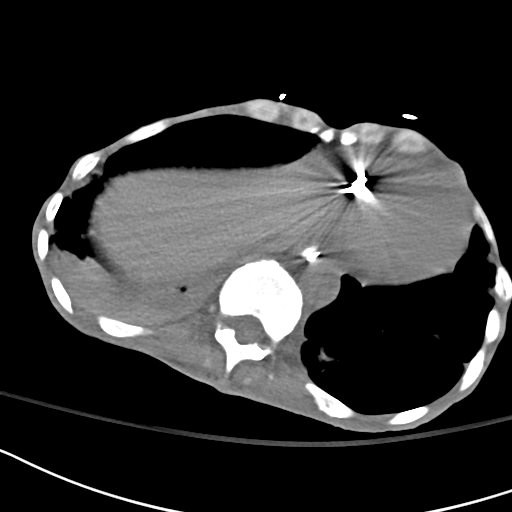
[im 46/49  soft-tissue]
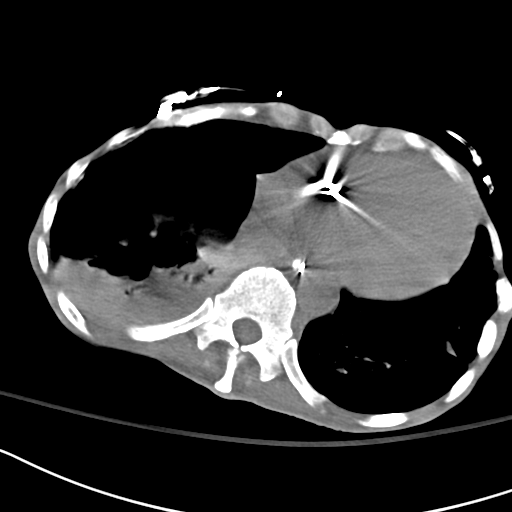

[Series 6: cor · coronal · 0.47mm/px · 3 of 68 slices shown]
[im 23/68  soft-tissue]
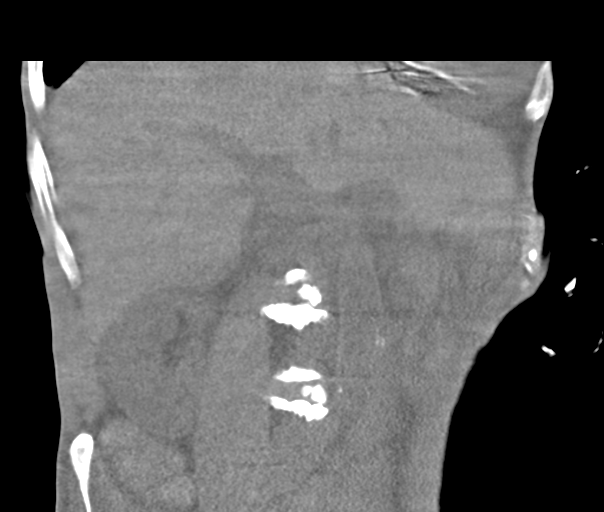
[im 30/68  soft-tissue]
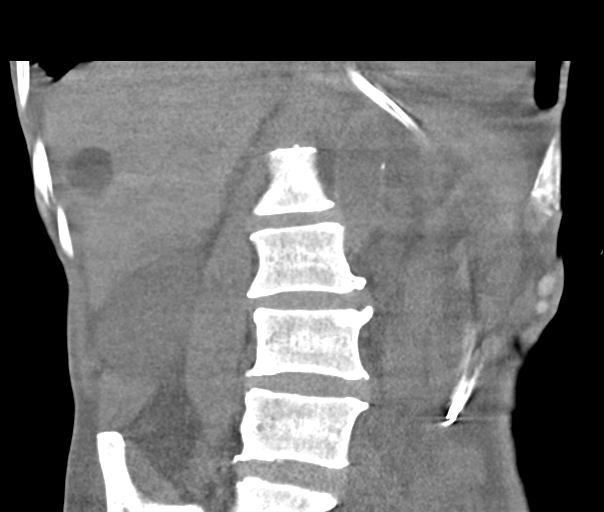
[im 38/68  soft-tissue]
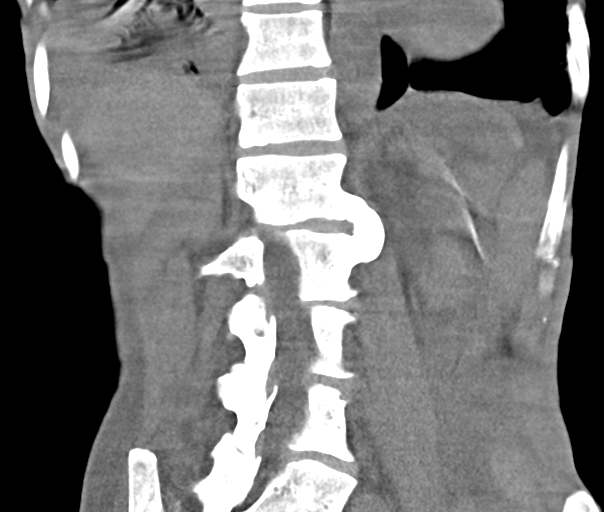

[16 of 46 positions shown; findings below may reference images not displayed]

FINDINGS: Lower chest: Severe obstructive pulmonary disease background. RIGHT
basilar dependent consolidation and LEFT basilar nodular pulmonary
opacities. Single lead AICD with tip within the RIGHT ventricle.

Hepatobiliary: 2.5 cm RIGHT posterior hepatic lobe hypodense lesion,
incompletely assessed on this single phase evaluation, though likely
a cyst versus hemangioma. Prominence of the LEFT hepatic lobe,
draping over the stomach at the LEFT upper quadrant. Contracted
gallbladder with radiodense sludge/stones. No biliary dilatation.

Pancreas: Normal noncontrast evaluation. No pancreatic duct
dilatation.

Spleen: Normal in size without focal abnormality.

Adrenals/Urinary Tract: Adrenal glands are unremarkable. Kidneys are
normal, without renal calculi, focal lesion, or hydronephrosis.

Stomach/Bowel: Enteric feeding tube, with tip within stomach.
Elongated stomach. Nondistended small bowel and nondilated colon,
within the imaged portions.

Vascular/Lymphatic: Hypodense aortic blood pool. Minimal aortic
atherosclerosis. No enlarged abdominal lymph nodes.

Other: Cachectic.  No abdominal wall hernia or abnormality.

Musculoskeletal: No acute osseous findings.
IMPRESSION: 1. Prominent LEFT hepatic lobe, draping over the stomach and LEFT
upper quadrant. Elongated stomach with NG tube. Perceivable window
for percutaneous gastrostomy placement, with final decision-making
reserved for procedural physician.
2. RIGHT basilar consolidation and LEFT basilar nodular pulmonary
opacities. Findings favored consistent with aspiration
pneumonia/pneumonitis.

## 2021-12-30 IMAGING — DX DG CHEST 1V PORT
1 series · 1 of 1 positions shown · non-contrast
Comparison: Chest x-ray 10/17/2021.

CLINICAL DATA: 55-year-old male with history of cough.

EXAM:
PORTABLE CHEST 1 VIEW

[chest]
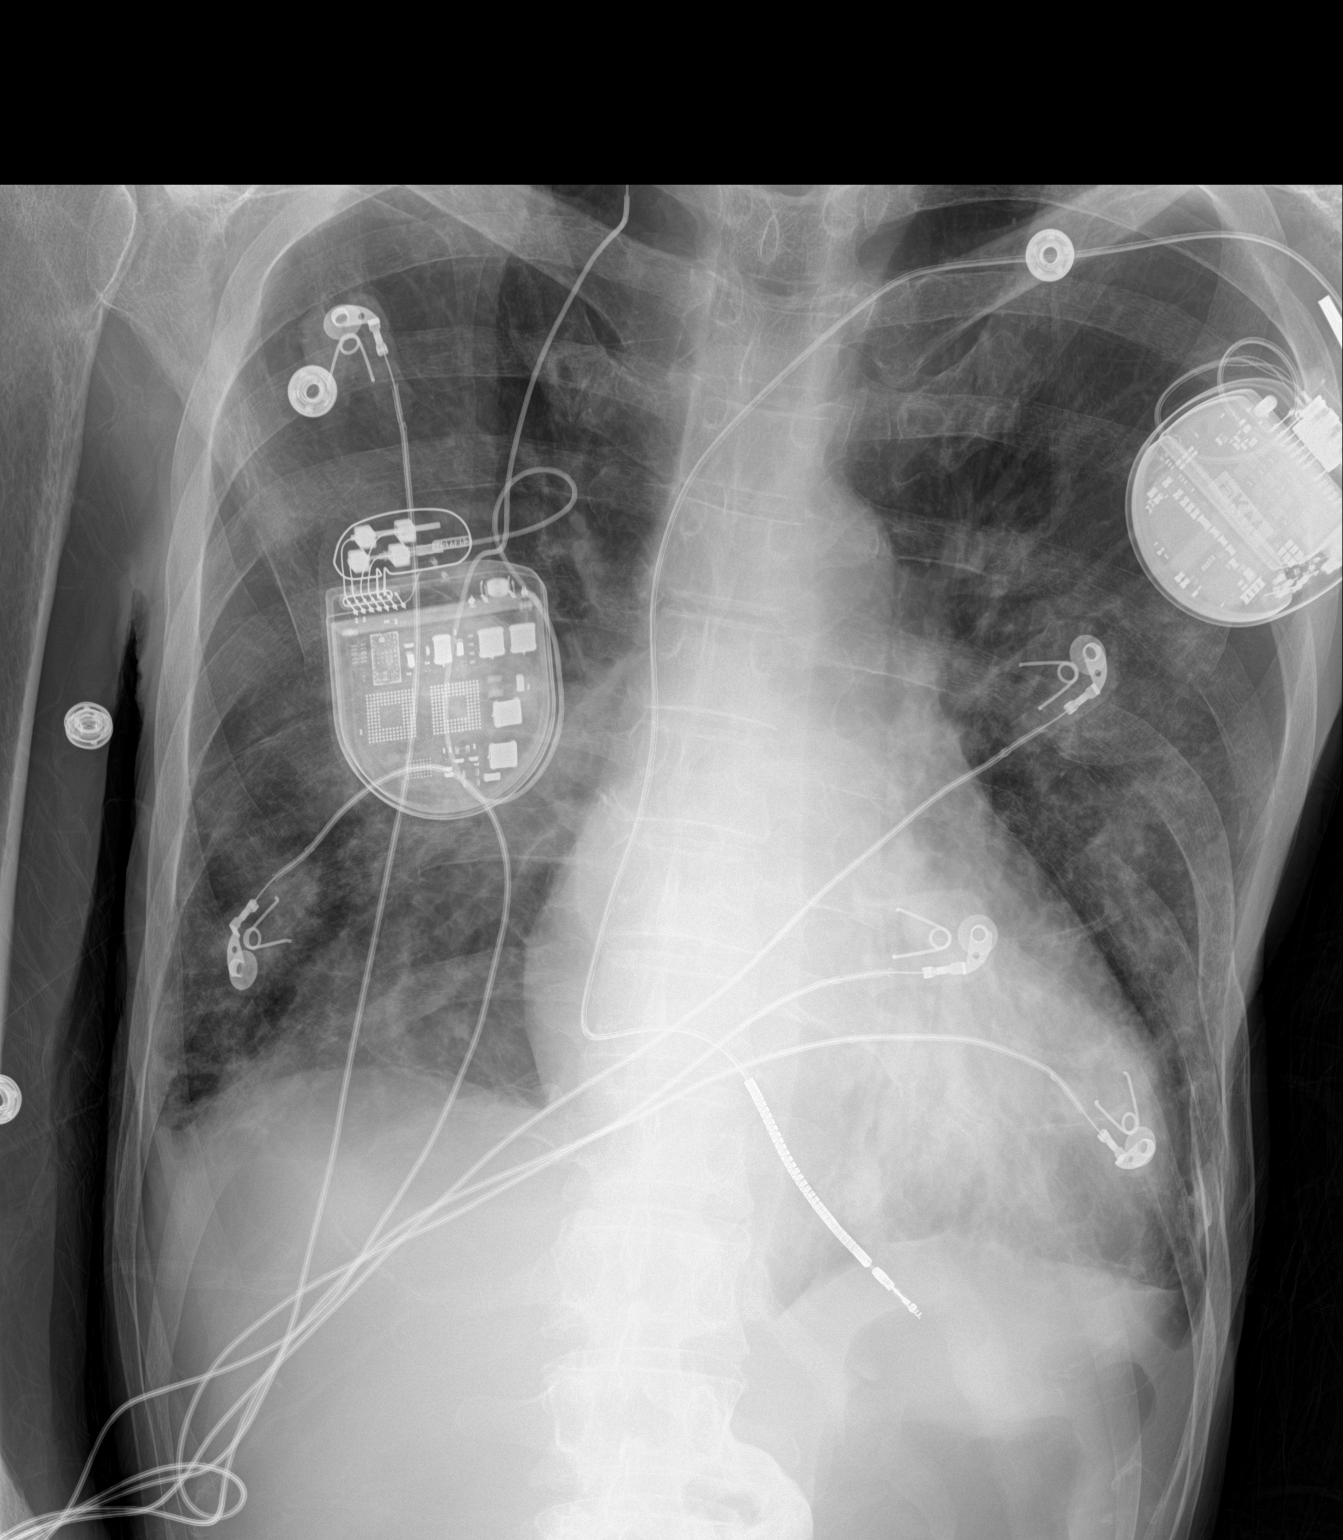

[1 of 1 positions shown; findings below may reference images not displayed]

FINDINGS: There continues to be an ill-defined opacity at the right base,
although slightly improved compared to the prior study. Generalized
areas of interstitial prominence are again noted throughout the
lungs bilaterally, along with widespread areas of peribronchial
cuffing. Left lung otherwise appears relatively clear. Trace
bilateral pleural effusions. No pneumothorax. No evidence of
pulmonary edema. Heart size is borderline enlarged. Scratch the
heart size is mildly enlarged. Upper mediastinal contours are within
normal limits. Atherosclerotic calcifications in the thoracic aorta.
Electronic device projecting over the right hemithorax with lead tip
extending cephalad, presumably a deep brain stimulator. Left-sided
pacemaker/AICD in place with lead tip projecting over the expected
location of the right ventricular apex.
IMPRESSION: 1. Improving airspace consolidation in the right lower lobe.
2. Trace bilateral pleural effusions.
3. Mild cardiomegaly.
4. Aortic atherosclerosis.

## 2022-01-01 IMAGING — DX DG CHEST 1V PORT
1 series · 1 of 1 positions shown · non-contrast
Comparison: October 28, 2021

CLINICAL DATA: Status post PICC line placement.

EXAM:
PORTABLE CHEST 1 VIEW

[chest]
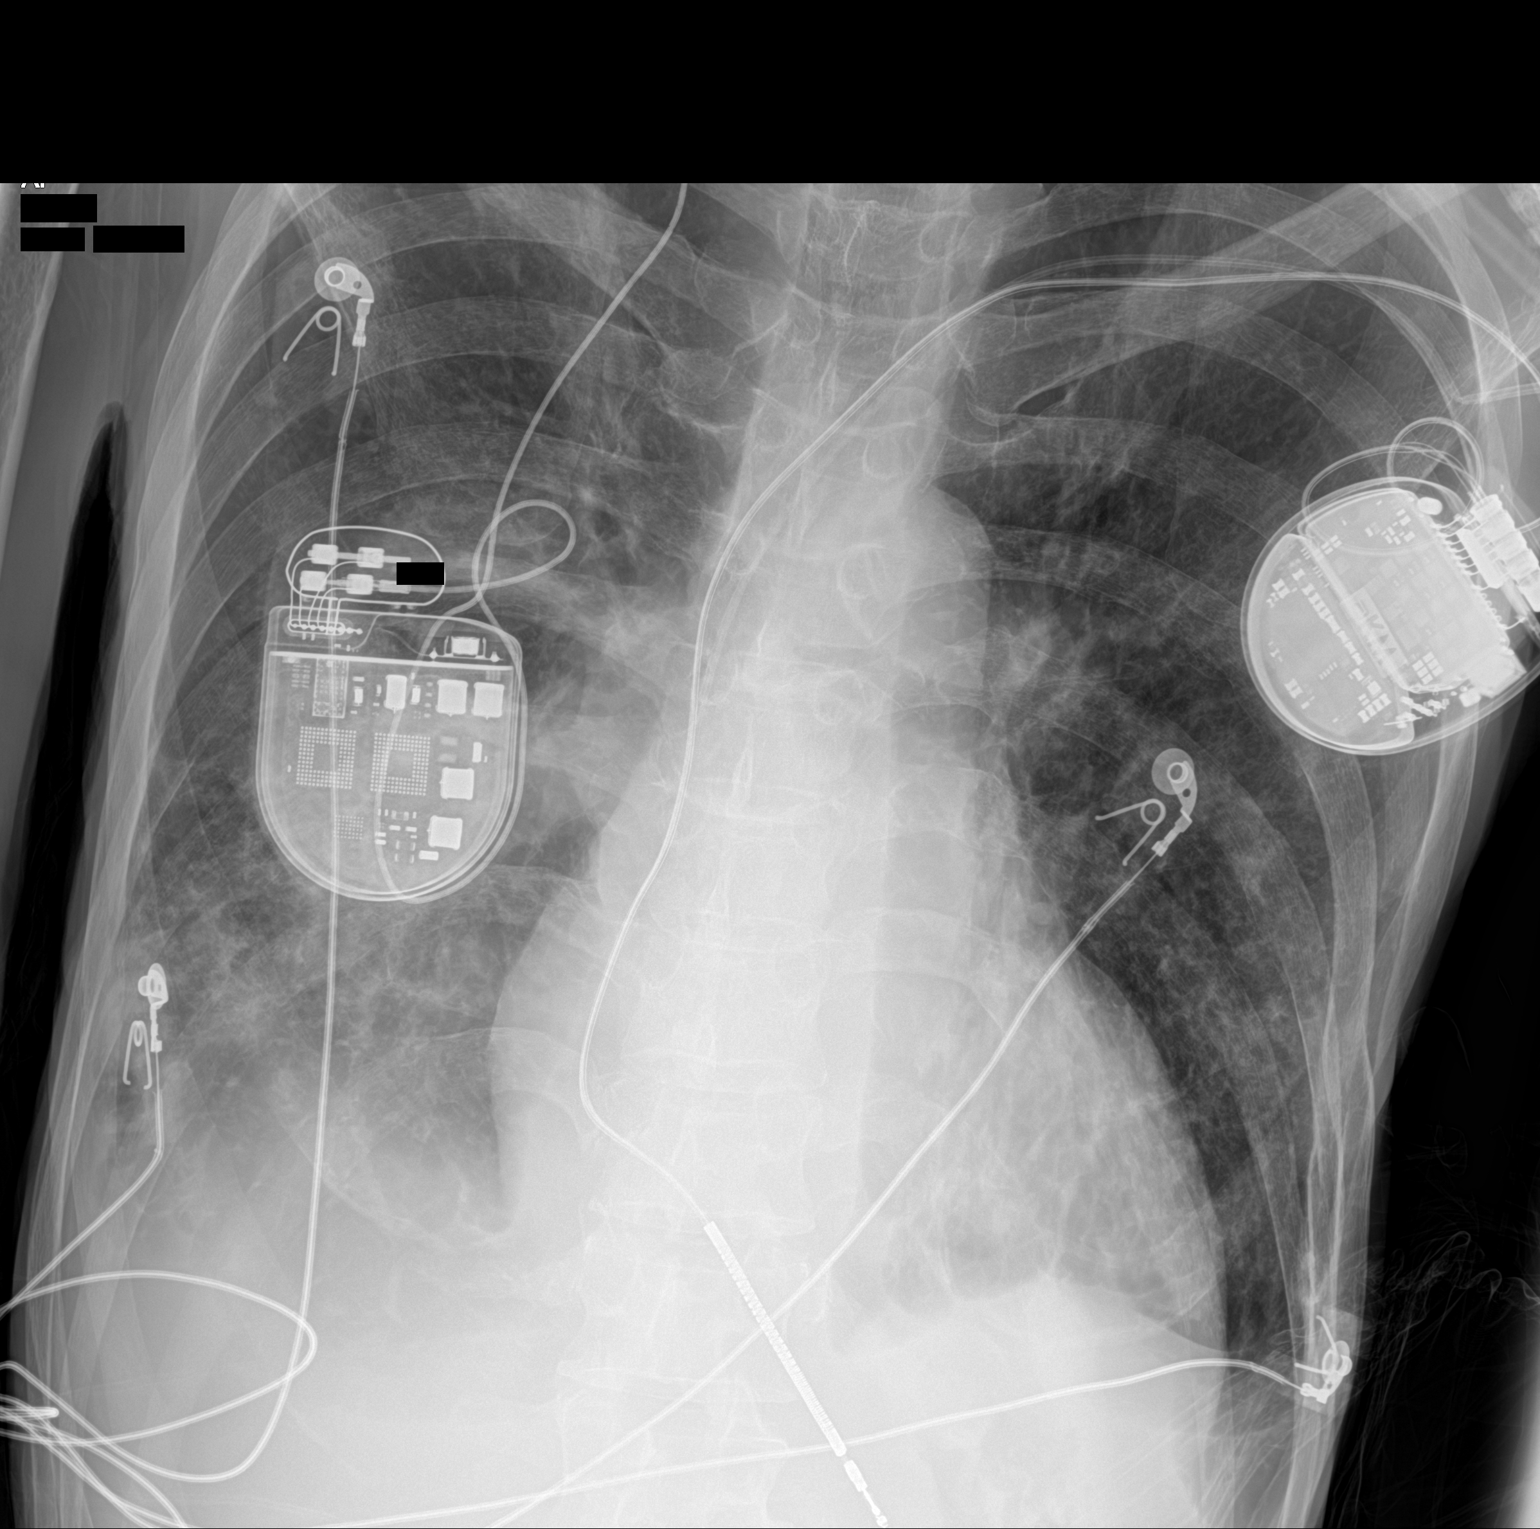

[1 of 1 positions shown; findings below may reference images not displayed]

FINDINGS: A new left-sided PICC line terminates in the central SVC. No
pneumothorax. The cardiomediastinal silhouette is stable. Opacity
remains in the right base and mid lung. No other acute abnormalities
or changes.
IMPRESSION: 1. The new left PICC line is in good position terminating in the
central SVC.
2. Right basilar infiltrate is stable.  No other changes.
